# Patient Record
Sex: Male | Born: 1955 | Race: White | Hispanic: No | Smoking: Never smoker
Health system: Southern US, Community
[De-identification: ages and names within clinical notes are randomized; demographics above are authoritative.]

## PROBLEM LIST (undated history)

## (undated) DIAGNOSIS — G4730 Sleep apnea, unspecified: Secondary | ICD-10-CM

## (undated) DIAGNOSIS — E785 Hyperlipidemia, unspecified: Secondary | ICD-10-CM

## (undated) DIAGNOSIS — I10 Essential (primary) hypertension: Secondary | ICD-10-CM

## (undated) HISTORY — PX: HERNIA REPAIR: SHX51

## (undated) HISTORY — DX: Sleep apnea, unspecified: G47.30

## (undated) HISTORY — DX: Essential (primary) hypertension: I10

## (undated) HISTORY — DX: Hyperlipidemia, unspecified: E78.5

---

## 2010-11-09 ENCOUNTER — Ambulatory Visit: Payer: Self-pay | Admitting: Gastroenterology

## 2010-11-09 LAB — HM COLONOSCOPY

## 2010-11-11 LAB — PATHOLOGY REPORT

## 2012-11-22 ENCOUNTER — Observation Stay: Payer: Self-pay | Admitting: Internal Medicine

## 2012-11-22 LAB — CBC
HCT: 45.2 % (ref 40.0–52.0)
HGB: 15.3 g/dL (ref 13.0–18.0)
MCH: 28.3 pg (ref 26.0–34.0)
MCV: 84 fL (ref 80–100)
Platelet: 237 10*3/uL (ref 150–440)
RBC: 5.4 10*6/uL (ref 4.40–5.90)
WBC: 9.7 10*3/uL (ref 3.8–10.6)

## 2012-11-22 LAB — LIPID PANEL
Ldl Cholesterol, Calc: 107 mg/dL — ABNORMAL HIGH (ref 0–100)
Triglycerides: 97 mg/dL (ref 0–200)
VLDL Cholesterol, Calc: 19 mg/dL (ref 5–40)

## 2012-11-22 LAB — BASIC METABOLIC PANEL
Calcium, Total: 9.2 mg/dL (ref 8.5–10.1)
Chloride: 106 mmol/L (ref 98–107)
Co2: 24 mmol/L (ref 21–32)
Creatinine: 0.94 mg/dL (ref 0.60–1.30)
Osmolality: 282 (ref 275–301)
Potassium: 3.8 mmol/L (ref 3.5–5.1)
Sodium: 139 mmol/L (ref 136–145)

## 2012-11-22 LAB — HEPATIC FUNCTION PANEL A (ARMC)
Albumin: 3.8 g/dL (ref 3.4–5.0)
Alkaline Phosphatase: 107 U/L (ref 50–136)
Bilirubin,Total: 0.4 mg/dL (ref 0.2–1.0)
SGPT (ALT): 33 U/L (ref 12–78)
Total Protein: 8.3 g/dL — ABNORMAL HIGH (ref 6.4–8.2)

## 2012-11-22 LAB — TROPONIN I: Troponin-I: <0.02 ng/mL

## 2012-11-22 LAB — CK TOTAL AND CKMB (NOT AT ARMC): CK, Total: 97 U/L (ref 35–232)

## 2012-11-23 LAB — TROPONIN I: Troponin-I: <0.02 ng/mL

## 2013-10-12 ENCOUNTER — Ambulatory Visit: Payer: Self-pay | Admitting: General Practice

## 2014-07-23 LAB — PSA

## 2015-03-14 NOTE — H&P (Signed)
PATIENT NAME:  Casey Hunter, Casey Hunter MR#:  [id number] DATE OF BIRTH:  1956/03/05  DATE OF ADMISSION:  11/22/2012  PRIMARY CARE PHYSICIAN: Vonita Moss, MD  CHIEF COMPLAINT: Chest pain on and off for a few days.   HISTORY OF PRESENT ILLNESS: Casey Hunter is a pleasant 59 year old obese Caucasian gentleman with history of hypertension, diabetes and high cholesterol who comes to the Emergency Room after he started experiencing chest pain on and off for the past few days. It got worse today described as a mid substernal chest pain radiating to the left arm and facial numbness. He rate his pain around 5 to 6 on a scale of 10. Currently after treatment with aspirin and nitroglycerin, his pain is 1 to 2. He denies any shortness of breath. Next, in the Emergency Room, the patient is hemodynamically stable. He is being admitted for further evaluation and management of his chest pain.   PAST MEDICAL HISTORY: 1. Type 2 diabetes, on insulin. 2. High cholesterol.  3. Hypertension.  4. Hernia repair.   FAMILY HISTORY: CAD, premature, in brother, around age 33. His brother did end up having stents.   MEDICATIONS: 1. Tribenzor 40 mg/10 mg/25 mg p.o. daily.  2. NovoLog 6 units 2 times a day. 3. Niacin 1000 mg once a day at bedtime. 4. Levemir 60 mg 2 times a day. 5. Kombiglyze XR 5 mg/1000 mg 2 tablets at bedtime.  6. Crestor 10 mg 2 times a week.  7. Aspirin 81 mg daily.  8. Welchol 625 mg 3 tablets daily at bedtime.  9. Zetia 10 mg daily.   SOCIAL HISTORY: Denies history of smoking or alcohol use.   REVIEW OF SYSTEMS:  CONSTITUTIONAL: No fever, fatigue or weakness.   EYES: No blurred or double vision. No glaucoma.   ENT: No tinnitus, ear pain or hearing loss.   RESPIRATORY: No cough, wheeze, hemoptysis or dyspnea.   CARDIOVASCULAR: Positive for chest pain.  No orthopnea or edema. Positive for hypertension.   GASTROINTESTINAL: No nausea, vomiting, diarrhea or abdominal pain. No  GERD.  GENITOURINARY: No dysuria or hematuria.   ENDOCRINE: No polyuria or nocturia.   HEMATOLOGY: No anemia or easy bruising.   SKIN: Positive for psoriasis rash around on the hands and on the elbows.   MUSCULOSKELETAL: Positive for arthritis.   NEUROLOGIC: No CVA or TIA.   PSYCH: No anxiety or depression. All other systems reviewed and negative.   PHYSICAL EXAMINATION:  GENERAL: The patient is awake, alert and oriented x 3, not in acute distress.   VITAL SIGNS: Afebrile, pulse 101, blood pressure 101/74 and saturations are 96% on room air.   GENERAL: Morbidly obese, not in acute distress.   HEENT: Atraumatic, normocephalic. Pupils are equally round and reactive to light and accommodation. EOMs intact. Oral mucosa is moist.   NECK: Supple. No JVD. No carotid bruit.   RESPIRATORY: Clear to auscultation bilaterally. No rales, rhonchi, respiratory distress or labored breathing.   CARDIOVASCULAR:  Both the heart sounds are normal. Rate and rhythm is regular. PMI is not lateralized. Chest is nontender.   EXTREMITIES: Good pedal pulses. Good femoral pulses. No lower extremity edema.   ABDOMEN: Soft, obese and nontender. No organomegaly. Positive bowel sounds.   NEUROLOGIC: Grossly intact cranial nerves II through XII. No motor or sensory deficits.   PSYCH: The patient is awake, alert and oriented x 3.   SKIN: Warm and dry. Psoriatic lesions present over the joints in the upper extremities.  LABS/RADIOLOGIC STUDIES: Chest x-ray:  No acute cardiopulmonary disease.   CBC within normal limits. Basic metabolic panel within normal limits, except glucose of 151. Cardiac enzymes, first set negative. LFTs within normal limits.   EKG shows sinus tachycardia with Q waves in inferior leads, appears to be old, along with left axis deviation.   ASSESSMENT AND PLAN: This is 59 year old Casey Hunter with history of hypertension, type 2 diabetes and hyperlipidemia who presents to the Emergency  Room with:  1. Chest pain. The patient is being admitted for overnight observation on telemetry floor. He has multiple risk factors including uncontrolled type 2 diabetes, hypertension, hyperlipidemia and family history of premature CAD.  We will admit the patient to the telemetry floor and continue his home medications with aspirin, niacin and statins along with Welchol. We will also continue his home medication, which is his Tribenzor, that is a combination of hydrochlorothiazide, amlodipine and olmesartan. Nitroglycerin patch prescribed. Continue daily aspirin. I will also start the patient on Lovenox 1 mg/kg 2 times a day until the patient is ruled out. Cardiology consultation today. [facility]. We will also order echocardiogram.  2. Type 2 diabetes, uncontrolled, insulin requiring. We will resume the patient's Levemir and Regular NovoLog insulin twice a day along with his Kombiglyze and sliding scale insulin.  3. Hyperlipidemia. The patient will be continued on niacin, Welchol, Crestor and Zetia.  4. DVT prophylaxis. On Lovenox and aspirin.  5. Morbid obesity. The patient is recommended diet and exercise.   Further work-up according to the patient's clinical course. Hospital admission plan was discussed with the patient and family members.   TIME SPENT: 50 minutes. ____________________________ Wylie HailSona A. Allena KatzPatel, MD sap:sb D: 11/22/2012 11:39:36 ET T: 11/22/2012 11:52:09 ET JOB#: [id number]  cc: Jlon Betker A. Allena KatzPatel, MD, <Dictator> Steele SizerMark A. Crissman, MD Willow OraSONA A Bonnita Newby MD ELECTRONICALLY SIGNED 11/26/2012 11:36

## 2015-03-14 NOTE — Consult Note (Signed)
PATIENT NAME:  Casey Hunter, Casey Hunter DATE OF BIRTH:  04-28-1956  DATE OF CONSULTATION:  11/22/2012  REFERRING PHYSICIAN:  Enedina FinnerSona Patel, MD CONSULTING PHYSICIAN:  Casey MillardAlexander Alisha Bacus, MD  PRIMARY CARE PHYSICIAN: Vonita MossMark Crissman, MD  CHIEF COMPLAINT: Chest pain.   REASON FOR CONSULTATION: Consultation is requested for evaluation of chest pain.   HISTORY OF PRESENT ILLNESS: The patient is a 59 year old gentleman with multiple cardiovascular risk factors referred for evaluation of chest pain. The patient reports that he experienced his first episode of chest discomfort sometime following the Christmas holiday. During the last two days, the patient has had recurrent episodes of chest discomfort. He describes it as substernal chest discomfort, at times pressure-like and at other times sharp pain. He has also noted tingling in his face and his left arm. These episodes appear to be unrelated to exertion. The patient normally is quite active. [facility] Room where EKG was nondiagnostic. Initial troponin is negative.   PAST MEDICAL HISTORY: 1. Hypertension.  2. Hyperlipidemia.  3. Type 2 diabetes.   MEDICATIONS:  1. Aspirin 81 mg daily.  2. Tribenzor 40/10/25 mg daily.  3. Niacin 1000 mg at bedtime.  4. Crestor 10 mg twice a week. 5. Welchol 625 mg 3 tablets at bedtime.  6. Zetia 10 mg daily.  7. NovoLog 6 units 2 times a day. 8. Levemir 60 mg 2 times a day. 9. Kombiglyze XR 5 mg/1000 mg 2 tablets at bedtime.   SOCIAL HISTORY: The patient lives with his girlfriend. He denies tobacco abuse.   FAMILY HISTORY: Brother is status post coronary stents.   REVIEW OF SYSTEMS:  CONSTITUTIONAL: No fever or chills.   EYES: No blurry vision.   EARS: No hearing loss.   RESPIRATORY: No shortness of breath.   CARDIOVASCULAR: Chest discomfort, as described above.   GASTROINTESTINAL: No nausea, vomiting or diarrhea.   GENITOURINARY: No dysuria or hematuria.   ENDOCRINE:  No polyuria or polydipsia.   MUSCULOSKELETAL: No arthralgias or myalgias.   NEUROLOGICAL: No focal muscle weakness or numbness.   PSYCHOLOGICAL: No depression or anxiety.  PHYSICAL EXAMINATION:  VITAL SIGNS: Blood pressure is 158/99, pulse 100, respirations 18, temperature 97.8 and pulse oximetry 95%.   HEENT: Pupils equal and reactive to light and accommodation.   NECK: Supple without thyromegaly.   PULMONARY: Lungs were clear.   CARDIOVASCULAR: Normal JVP. Normal PMI. Regular rate and rhythm. Normal S1, S2. No appreciable gallop, murmur or rub.   ABDOMEN: Soft and nontender. Pulses were intact bilaterally.   MUSCULOSKELETAL: Normal muscle tone.   NEUROLOGIC: The patient is alert and oriented x 3. Motor and sensory are both grossly intact.   IMPRESSION: This is a 59 year old gentleman with multiple cardiovascular risk factors with recent onset chest pain with typical and atypical features. EKG is nondiagnostic. The patient has ruled out for myocardial infarction by CPK isoenzymes and troponin. Blood pressure is elevated.   RECOMMENDATIONS: 1. Up titrate metoprolol as needed for blood pressure control.  2. Continue to cycle cardiac enzymes.  3. If the patient does well overnight and continues to rule out for myocardial infarction and then proceed with ETT sestamibi study in the a.m.  4. If the patient has persistent or worsening chest pain and/or positive troponin, then would proceed with cardiac catheterization.  ____________________________ Casey MillardAlexander Amilee Janvier, MD ap:sb D: 11/22/2012 13:19:36 ET T: 11/22/2012 13:27:51 ET JOB#: [id number]  cc: Casey MillardAlexander Nimco Bivens, MD, <Dictator> Casey MillardALEXANDER Sondos Wolfman MD ELECTRONICALLY SIGNED 12/08/2012 8:47

## 2015-03-14 NOTE — Discharge Summary (Signed)
PATIENT NAME:  Casey Hunter, Casey Hunter MR#:  [id number] DATE OF BIRTH:  February 20, 1956  DATE OF ADMISSION:  11/22/2012 DATE OF DISCHARGE:  11/23/2012  PRIMARY CARE PHYSICIAN: Dr. Vonita MossMark Crissman.  CONSULTATION: Cardiology, Dr. Darrold JunkerParaschos.  PRESENTING COMPLAINT: Chest pain.   DISCHARGE DIAGNOSES:  1. Chest pain, appears atypical.  2. Hypertension.  3. Type 2 diabetes.  4. Hyperlipidemia.  5. Morbid obesity.   DIAGNOSTIC DATA: Myoview stress test negative for ischemia, normal wall motion, normal LV function. Cardiac enzymes x 3 negative. CBC within normal limits. Basic metabolic panel within normal limits. LFT's within normal limits. Lipid profile: LDL is 107.   DISCHARGE MEDICATIONS: Are: 1. WelChol 625 mg 3 tablets once a day at bedtime.  2. Zetia 10 mg daily.  3. Kombiglyze XR 03/999 two tablets at bedtime.  4. Niacin 1000 mg extended-release 1 tablet at bedtime.  5. Tribenzor 40/10/25 mg 1 tablet daily.  6. Crestor 10 mg twice a week.  7. Levemir 60 units b.i.d.  8. NovoLog 6 units b.i.d.  9. Aspirin 81 mg daily.   DIET: Low sodium, low fat, cholesterol, carbohydrate-controlled diet.   FOLLOWUP: Follow up with Dr. Dossie Arbourrissman on 12/04/2012 at 9:45 a.m.   BRIEF SUMMARY OF HOSPITAL COURSE: The patient is a pleasant 59 year old Caucasian gentleman with history of hypertension, type 2 diabetes, comes to the Emergency Room with:  1. Chest pain. He was admitted on telemetry floor given his multiple risk factors of diabetes, hypertension, hyperlipidemia, and morbid obesity. The patient also has a family history of premature coronary artery disease. He was ruled out with acute MI with 3 sets of negative cardiac enzymes and no EKG changes. The patient was seen by Dr. Darrold JunkerParaschos and he underwent Myoview scan which was essentially negative for any ischemia. The patient did not have any further chest pain, will follow up with Dr. Darrold JunkerParaschos as needed.  2. Type 2 diabetes. He was resumed on his Levemir regular,  NovoLog insulin, along with his Kombiglyze. 3. Hyperlipidemia: The patient is on niacin, WelChol, Crestor, and Zetia. 4. DVT prophylaxis: Lovenox and aspirin were given.  5. Morbid obesity: The patient was recommended diet and exercise.  Hospital stay otherwise remained stable. The patient remained a full code.   TIME SPENT: 40 minutes.    ____________________________ Wylie HailSona A. Allena KatzPatel, MD sap:es D: 11/24/2012 07:03:27 ET T: 11/24/2012 08:08:48 ET JOB#: [id number]  cc: Konrad Hoak A. Allena KatzPatel, MD, <Dictator> Steele SizerMark A. Crissman, MD Willow OraSONA A Denaja Verhoeven MD ELECTRONICALLY SIGNED 11/26/2012 11:36

## 2015-09-16 DIAGNOSIS — E1169 Type 2 diabetes mellitus with other specified complication: Secondary | ICD-10-CM | POA: Insufficient documentation

## 2015-09-16 DIAGNOSIS — E785 Hyperlipidemia, unspecified: Secondary | ICD-10-CM

## 2015-09-16 DIAGNOSIS — I152 Hypertension secondary to endocrine disorders: Secondary | ICD-10-CM

## 2015-09-16 DIAGNOSIS — E119 Type 2 diabetes mellitus without complications: Secondary | ICD-10-CM

## 2015-09-16 DIAGNOSIS — E7800 Pure hypercholesterolemia, unspecified: Secondary | ICD-10-CM

## 2015-09-16 DIAGNOSIS — G4730 Sleep apnea, unspecified: Secondary | ICD-10-CM | POA: Insufficient documentation

## 2015-09-16 DIAGNOSIS — I10 Essential (primary) hypertension: Secondary | ICD-10-CM | POA: Insufficient documentation

## 2015-09-16 DIAGNOSIS — E1159 Type 2 diabetes mellitus with other circulatory complications: Secondary | ICD-10-CM | POA: Insufficient documentation

## 2015-09-16 HISTORY — DX: Type 2 diabetes mellitus with other circulatory complications: I15.2

## 2015-09-16 HISTORY — DX: Type 2 diabetes mellitus with other circulatory complications: E11.59

## 2015-09-16 HISTORY — DX: Type 2 diabetes mellitus with other specified complication: E11.69

## 2015-09-17 ENCOUNTER — Encounter: Payer: Self-pay | Admitting: Family Medicine

## 2015-09-17 ENCOUNTER — Ambulatory Visit (INDEPENDENT_AMBULATORY_CARE_PROVIDER_SITE_OTHER): Payer: BLUE CROSS/BLUE SHIELD | Admitting: Family Medicine

## 2015-09-17 VITALS — BP 117/77 | HR 112 | Temp 97.7°F | Ht 72.1 in | Wt 269.0 lb

## 2015-09-17 DIAGNOSIS — Z23 Encounter for immunization: Secondary | ICD-10-CM | POA: Diagnosis not present

## 2015-09-17 DIAGNOSIS — I10 Essential (primary) hypertension: Secondary | ICD-10-CM | POA: Diagnosis not present

## 2015-09-17 DIAGNOSIS — E785 Hyperlipidemia, unspecified: Secondary | ICD-10-CM | POA: Diagnosis not present

## 2015-09-17 DIAGNOSIS — E119 Type 2 diabetes mellitus without complications: Secondary | ICD-10-CM

## 2015-09-17 LAB — BAYER DCA HB A1C WAIVED: HB A1C: 6.3 % (ref <7.0)

## 2015-09-17 MED ORDER — NIACIN ER (ANTIHYPERLIPIDEMIC) 1000 MG PO TBCR: 1000.0000 mg | EXTENDED_RELEASE_TABLET | Freq: Every day | ORAL | Status: DC

## 2015-09-17 MED ORDER — METFORMIN HCL 1000 MG PO TABS: 1000.0000 mg | ORAL_TABLET | Freq: Two times a day (BID) | ORAL | Status: DC

## 2015-09-17 MED ORDER — EZETIMIBE 10 MG PO TABS: 10.0000 mg | ORAL_TABLET | Freq: Every day | ORAL | Status: DC

## 2015-09-17 MED ORDER — DAPAGLIFLOZIN PROPANEDIOL 10 MG PO TABS: 10.0000 mg | ORAL_TABLET | Freq: Every day | ORAL | Status: DC

## 2015-09-17 MED ORDER — COLESEVELAM HCL 625 MG PO TABS: 1875.0000 mg | ORAL_TABLET | Freq: Two times a day (BID) | ORAL | Status: DC

## 2015-09-17 MED ORDER — OLMESARTAN-AMLODIPINE-HCTZ 40-10-25 MG PO TABS: 1.0000 | ORAL_TABLET | Freq: Every day | ORAL | Status: DC

## 2015-09-17 MED ORDER — ALBIGLUTIDE 50 MG ~~LOC~~ PEN: 1.0000 "application " | PEN_INJECTOR | SUBCUTANEOUS | Status: DC

## 2015-09-17 MED ORDER — SITAGLIPTIN PHOSPHATE 100 MG PO TABS: 100.0000 mg | ORAL_TABLET | Freq: Every day | ORAL | Status: DC

## 2015-09-17 NOTE — Progress Notes (Signed)
BP 117/77 mmHg  Pulse 112  Temp(Src) 97.7 F (36.5 C)  Ht 6' 0.1" (1.831 m)  Wt 269 lb (122.018 kg)  BMI 36.40 kg/m2  SpO2 95%   Subjective:    Patient ID: Casey GreenspanJohn H Devereux Jr., male    DOB: 05/24/1956, 59 y.o.   MRN: [id number]  HPI: Casey GreenspanJohn H Leppo Jr. is a 59 y.o. male  Chief Complaint  Patient presents with  . Diabetes  . Hypertension  . Hyperlipidemia   patient doing well diabetes noted low blood sugar spells no complaints taking medications faithfully except for [employer] wants to change Onglyza and metformin combo Blood pressure doing well no complaints from medications Cholesterol doing well no complaints medications takes all medicines faithfully without problems.  Relevant past medical, surgical, family and social history reviewed and updated as indicated. Interim medical history since our last visit reviewed. Allergies and medications reviewed and updated.  Review of Systems  Constitutional: Negative.   Respiratory: Negative.   Cardiovascular: Negative.     Per HPI unless specifically indicated above     Objective:    BP 117/77 mmHg  Pulse 112  Temp(Src) 97.7 F (36.5 C)  Ht 6' 0.1" (1.831 m)  Wt 269 lb (122.018 kg)  BMI 36.40 kg/m2  SpO2 95%  Wt Readings from Last 3 Encounters:  09/17/15 269 lb (122.018 kg)  02/27/15 283 lb (128.368 kg)    Physical Exam  Constitutional: He is oriented to person, place, and time. He appears well-developed and well-nourished. No distress.  HENT:  Head: Normocephalic and atraumatic.  Right Ear: Hearing normal.  Left Ear: Hearing normal.  Nose: Nose normal.  Eyes: Conjunctivae and lids are normal. Right eye exhibits no discharge. Left eye exhibits no discharge. No scleral icterus.  Cardiovascular: Normal rate, regular rhythm and normal heart sounds.   Pulmonary/Chest: Effort normal and breath sounds normal. No respiratory distress.  Musculoskeletal: Normal range of motion.  Neurological: He is alert and oriented to  person, place, and time.  Skin: Skin is intact. No rash noted.  Psychiatric: He has a normal mood and affect. His speech is normal and behavior is normal. Judgment and thought content normal. Cognition and memory are normal.    Results for orders placed or performed in visit on 09/16/15  PSA  Result Value Ref Range   PSA from PP   HM COLONOSCOPY  Result Value Ref Range   HM Colonoscopy from PP       Assessment & Plan:   Problem List Items Addressed This Visit      Cardiovascular and Mediastinum   Hypertension    The current medical regimen is effective;  continue present plan and medications.       Relevant Medications   colesevelam (WELCHOL) 625 MG tablet   ezetimibe (ZETIA) 10 MG tablet   niacin (NIASPAN) 1000 MG CR tablet   Olmesartan-Amlodipine-HCTZ (TRIBENZOR) 40-10-25 MG TABS     Endocrine   Diabetes (HCC)    The current medical regimen is effective;  continue present plan and medications.       Relevant Medications   metFORMIN (GLUCOPHAGE) 1000 MG tablet   sitaGLIPtin (JANUVIA) 100 MG tablet   Albiglutide (TANZEUM) 50 MG PEN   dapagliflozin propanediol (FARXIGA) 10 MG TABS tablet   Olmesartan-Amlodipine-HCTZ (TRIBENZOR) 40-10-25 MG TABS   Other Relevant Orders   Bayer DCA Hb A1c Waived     Other   Hyperlipidemia    The current medical regimen is effective;  continue  present plan and medications.       Relevant Medications   colesevelam (WELCHOL) 625 MG tablet   ezetimibe (ZETIA) 10 MG tablet   niacin (NIASPAN) 1000 MG CR tablet   Olmesartan-Amlodipine-HCTZ (TRIBENZOR) 40-10-25 MG TABS    Other Visit Diagnoses    Immunization due    -  Primary    Relevant Orders    Flu Vaccine QUAD 36+ mos PF IM (Fluarix & Fluzone Quad PF) (Completed)        Follow up plan: Return in about 3 months (around 12/18/2015), or if symptoms worsen or fail to improve, for Recheck medical problems A1c, BMP, lipids, ALT AST.

## 2015-09-17 NOTE — Assessment & Plan Note (Signed)
The current medical regimen is effective;  continue present plan and medications.  

## 2015-10-20 ENCOUNTER — Telehealth: Payer: Self-pay

## 2015-10-20 MED ORDER — SILDENAFIL CITRATE 100 MG PO TABS: 50.0000 mg | ORAL_TABLET | Freq: Every day | ORAL | Status: DC | PRN

## 2015-10-20 NOTE — Telephone Encounter (Signed)
Patient requesting Rx for Viagra  [facility] [address]

## 2015-10-21 ENCOUNTER — Telehealth: Payer: Self-pay

## 2015-10-21 NOTE — Telephone Encounter (Signed)
Patient notified

## 2015-10-21 NOTE — Telephone Encounter (Signed)
Left message to return phone call.

## 2016-01-05 ENCOUNTER — Ambulatory Visit: Payer: BLUE CROSS/BLUE SHIELD | Admitting: Family Medicine

## 2016-01-07 ENCOUNTER — Ambulatory Visit: Payer: BLUE CROSS/BLUE SHIELD | Admitting: Family Medicine

## 2016-01-08 ENCOUNTER — Encounter: Payer: Self-pay | Admitting: Family Medicine

## 2016-01-08 ENCOUNTER — Ambulatory Visit (INDEPENDENT_AMBULATORY_CARE_PROVIDER_SITE_OTHER): Payer: BLUE CROSS/BLUE SHIELD | Admitting: Family Medicine

## 2016-01-08 VITALS — BP 108/72 | HR 79 | Temp 97.6°F | Ht 72.1 in | Wt 270.0 lb

## 2016-01-08 DIAGNOSIS — E119 Type 2 diabetes mellitus without complications: Secondary | ICD-10-CM | POA: Diagnosis not present

## 2016-01-08 DIAGNOSIS — Z23 Encounter for immunization: Secondary | ICD-10-CM

## 2016-01-08 DIAGNOSIS — I10 Essential (primary) hypertension: Secondary | ICD-10-CM | POA: Diagnosis not present

## 2016-01-08 DIAGNOSIS — E785 Hyperlipidemia, unspecified: Secondary | ICD-10-CM

## 2016-01-08 LAB — BAYER DCA HB A1C WAIVED: HB A1C (BAYER DCA - WAIVED): 7 % — ABNORMAL HIGH (ref <7.0)

## 2016-01-08 LAB — LP+ALT+AST PICCOLO, WAIVED
ALT (SGPT) PICCOLO, WAIVED: 21 U/L (ref 10–47)
AST (SGOT) PICCOLO, WAIVED: 26 U/L (ref 11–38)
CHOL/HDL RATIO PICCOLO,WAIVE: 3.9 mg/dL
Cholesterol Piccolo, Waived: 198 mg/dL (ref <200)
HDL CHOL PICCOLO, WAIVED: 51 mg/dL — AB (ref >59)
LDL Chol Calc Piccolo Waived: 103 mg/dL — ABNORMAL HIGH (ref <100)
TRIGLYCERIDES PICCOLO,WAIVED: 223 mg/dL — AB (ref <150)
VLDL Chol Calc Piccolo,Waive: 45 mg/dL — ABNORMAL HIGH (ref <30)

## 2016-01-08 MED ORDER — SILDENAFIL CITRATE 20 MG PO TABS: 20.0000 mg | ORAL_TABLET | ORAL | Status: DC | PRN

## 2016-01-08 NOTE — Progress Notes (Signed)
BP 108/72 mmHg  Pulse 79  Temp(Src) 97.6 F (36.4 C)  Ht 6' 0.1" (1.831 m)  Wt 270 lb (122.471 kg)  BMI 36.53 kg/m2  SpO2 99%   Subjective:    Patient ID: Casey Greenspan., male    DOB: 1956-02-05, 60 y.o.   MRN: [id number]  HPI: Casey Montellano. is a 60 y.o. male  Chief Complaint  Patient presents with  . Diabetes  . Hyperlipidemia  . Hypertension   recheck medical problems doing well with diabetes noted low blood sugar spells taking cholesterol blood pressure with good control no side effects and takes faithfully.  Relevant past medical, surgical, family and social history reviewed and updated as indicated. Interim medical history since our last visit reviewed. Allergies and medications reviewed and updated.  Review of Systems  Constitutional: Negative.   Respiratory: Negative.   Cardiovascular: Negative.     Per HPI unless specifically indicated above     Objective:    BP 108/72 mmHg  Pulse 79  Temp(Src) 97.6 F (36.4 C)  Ht 6' 0.1" (1.831 m)  Wt 270 lb (122.471 kg)  BMI 36.53 kg/m2  SpO2 99%  Wt Readings from Last 3 Encounters:  01/08/16 270 lb (122.471 kg)  09/17/15 269 lb (122.018 kg)  02/27/15 283 lb (128.368 kg)    Physical Exam  Constitutional: He is oriented to person, place, and time. He appears well-developed and well-nourished. No distress.  HENT:  Head: Normocephalic and atraumatic.  Right Ear: Hearing normal.  Left Ear: Hearing normal.  Nose: Nose normal.  Eyes: Conjunctivae and lids are normal. Right eye exhibits no discharge. Left eye exhibits no discharge. No scleral icterus.  Cardiovascular: Normal rate, regular rhythm and normal heart sounds.   Pulmonary/Chest: Effort normal and breath sounds normal. No respiratory distress.  Musculoskeletal: Normal range of motion.  Neurological: He is alert and oriented to person, place, and time.  Skin: Skin is intact. No rash noted.  Psychiatric: He has a normal mood and affect. His speech is  normal and behavior is normal. Judgment and thought content normal. Cognition and memory are normal.    Results for orders placed or performed in visit on 09/17/15  Bayer DCA Hb A1c Waived  Result Value Ref Range   Bayer DCA Hb A1c Waived 6.3 <7.0 %      Assessment & Plan:   Problem List Items Addressed This Visit      Cardiovascular and Mediastinum   Hypertension    The current medical regimen is effective;  continue present plan and medications.       Relevant Medications   sildenafil (REVATIO) 20 MG tablet   Other Relevant Orders   Basic metabolic panel     Endocrine   Diabetes (HCC) - Primary    The current medical regimen is effective;  continue present plan and medications.       Relevant Orders   Bayer DCA Hb A1c Waived     Other   Hyperlipidemia    The current medical regimen is effective;  continue present plan and medications.       Relevant Medications   sildenafil (REVATIO) 20 MG tablet   Other Relevant Orders   LP+ALT+AST Piccolo, Waived    Other Visit Diagnoses    Immunization due        Relevant Orders    Tdap vaccine greater than or equal to 7yo IM (Completed)        Follow up plan: Return  in about 3 months (around 04/06/2016) for Physical Exam a1c.

## 2016-01-08 NOTE — Assessment & Plan Note (Signed)
The current medical regimen is effective;  continue present plan and medications.  

## 2016-01-09 LAB — BASIC METABOLIC PANEL
BUN / CREAT RATIO: 20 (ref 10–22)
BUN: 17 mg/dL (ref 8–27)
CALCIUM: 9.5 mg/dL (ref 8.6–10.2)
CHLORIDE: 97 mmol/L (ref 96–106)
CO2: 24 mmol/L (ref 18–29)
CREATININE: 0.87 mg/dL (ref 0.76–1.27)
GFR calc Af Amer: 108 mL/min/{1.73_m2} (ref >59)
GFR calc non Af Amer: 94 mL/min/{1.73_m2} (ref >59)
GLUCOSE: 188 mg/dL — AB (ref 65–99)
Potassium: 4.1 mmol/L (ref 3.5–5.2)
Sodium: 138 mmol/L (ref 134–144)

## 2016-01-10 ENCOUNTER — Encounter: Payer: Self-pay | Admitting: Family Medicine

## 2016-02-29 DIAGNOSIS — S61205A Unspecified open wound of left ring finger without damage to nail, initial encounter: Secondary | ICD-10-CM | POA: Diagnosis not present

## 2016-04-14 ENCOUNTER — Encounter: Payer: Self-pay | Admitting: Family Medicine

## 2016-04-14 ENCOUNTER — Ambulatory Visit (INDEPENDENT_AMBULATORY_CARE_PROVIDER_SITE_OTHER): Payer: BLUE CROSS/BLUE SHIELD | Admitting: Family Medicine

## 2016-04-14 VITALS — BP 104/70 | HR 86 | Temp 97.7°F | Ht 72.0 in | Wt 272.0 lb

## 2016-04-14 DIAGNOSIS — G4730 Sleep apnea, unspecified: Secondary | ICD-10-CM | POA: Diagnosis not present

## 2016-04-14 DIAGNOSIS — E785 Hyperlipidemia, unspecified: Secondary | ICD-10-CM | POA: Diagnosis not present

## 2016-04-14 DIAGNOSIS — E119 Type 2 diabetes mellitus without complications: Secondary | ICD-10-CM

## 2016-04-14 DIAGNOSIS — M1710 Unilateral primary osteoarthritis, unspecified knee: Secondary | ICD-10-CM | POA: Insufficient documentation

## 2016-04-14 DIAGNOSIS — Z0000 Encounter for general adult medical examination without abnormal findings: Secondary | ICD-10-CM | POA: Diagnosis not present

## 2016-04-14 DIAGNOSIS — M170 Bilateral primary osteoarthritis of knee: Secondary | ICD-10-CM

## 2016-04-14 DIAGNOSIS — I10 Essential (primary) hypertension: Secondary | ICD-10-CM

## 2016-04-14 DIAGNOSIS — L409 Psoriasis, unspecified: Secondary | ICD-10-CM | POA: Diagnosis not present

## 2016-04-14 DIAGNOSIS — M179 Osteoarthritis of knee, unspecified: Secondary | ICD-10-CM | POA: Insufficient documentation

## 2016-04-14 HISTORY — DX: Unilateral primary osteoarthritis, unspecified knee: M17.10

## 2016-04-14 HISTORY — DX: Osteoarthritis of knee, unspecified: M17.9

## 2016-04-14 LAB — URINALYSIS, ROUTINE W REFLEX MICROSCOPIC
BILIRUBIN UA: NEGATIVE
KETONES UA: NEGATIVE
LEUKOCYTES UA: NEGATIVE
Nitrite, UA: NEGATIVE
PROTEIN UA: NEGATIVE
RBC UA: NEGATIVE
SPEC GRAV UA: 1.01 (ref 1.005–1.030)
Urobilinogen, Ur: 0.2 mg/dL (ref 0.2–1.0)
pH, UA: 6.5 (ref 5.0–7.5)

## 2016-04-14 LAB — BAYER DCA HB A1C WAIVED: HB A1C (BAYER DCA - WAIVED): 7.1 % — ABNORMAL HIGH (ref <7.0)

## 2016-04-14 MED ORDER — DAPAGLIFLOZIN PROPANEDIOL 10 MG PO TABS: 10.0000 mg | ORAL_TABLET | Freq: Every day | ORAL | Status: DC

## 2016-04-14 MED ORDER — MELOXICAM 15 MG PO TABS
15.0000 mg | ORAL_TABLET | Freq: Every day | ORAL | Status: DC
Start: 2016-04-14 — End: 2016-08-22

## 2016-04-14 MED ORDER — METFORMIN HCL 1000 MG PO TABS
1000.0000 mg | ORAL_TABLET | Freq: Two times a day (BID) | ORAL | Status: DC
Start: 2016-04-14 — End: 2017-06-01

## 2016-04-14 MED ORDER — OLMESARTAN-AMLODIPINE-HCTZ 40-10-25 MG PO TABS: 1.0000 | ORAL_TABLET | Freq: Every day | ORAL | Status: DC

## 2016-04-14 MED ORDER — COLESEVELAM HCL 625 MG PO TABS: 1875.0000 mg | ORAL_TABLET | Freq: Two times a day (BID) | ORAL | Status: DC

## 2016-04-14 MED ORDER — NIACIN ER (ANTIHYPERLIPIDEMIC) 1000 MG PO TBCR: 1000.0000 mg | EXTENDED_RELEASE_TABLET | Freq: Every day | ORAL | Status: DC

## 2016-04-14 MED ORDER — ALBIGLUTIDE 50 MG ~~LOC~~ PEN: 1.0000 "application " | PEN_INJECTOR | SUBCUTANEOUS | Status: DC

## 2016-04-14 MED ORDER — EZETIMIBE 10 MG PO TABS: 10.0000 mg | ORAL_TABLET | Freq: Every day | ORAL | Status: DC

## 2016-04-14 MED ORDER — SITAGLIPTIN PHOSPHATE 100 MG PO TABS: 100.0000 mg | ORAL_TABLET | Freq: Every day | ORAL | Status: DC

## 2016-04-14 NOTE — Assessment & Plan Note (Signed)
The current medical regimen is effective;  continue present plan and medications.  

## 2016-04-14 NOTE — Assessment & Plan Note (Signed)
Stable will use meloxicam PRN

## 2016-04-14 NOTE — Addendum Note (Signed)
Addended byVonita Moss: Floella Ensz on: 04/14/2016 11:43 AM   Modules accepted: Orders, SmartSet

## 2016-04-14 NOTE — Progress Notes (Signed)
BP 104/70 mmHg  Pulse 86  Temp(Src) 97.7 F (36.5 C)  Ht 6' (1.829 m)  Wt 272 lb (123.378 kg)  BMI 36.88 kg/m2  SpO2 98%   Subjective:    Patient ID: Casey Hunter., male    DOB: Sep 23, 1956, 60 y.o.   MRN: [id number]  HPI: Kirk Basquez. is a 60 y.o. male  Chief Complaint  Patient presents with  . Annual Exam  Patient all in all doing well no complaints from medications taken faithfully without problems no low blood sugar spells blood pressure, cholesterol, diabetes all doing well. Sleep apnea not using CPAP Weights remained more or less stable.   Relevant past medical, surgical, family and social history reviewed and updated as indicated. Interim medical history since our last visit reviewed. Allergies and medications reviewed and updated.  Review of Systems  Constitutional: Negative.   HENT: Negative.   Eyes: Negative.   Respiratory: Negative.   Cardiovascular: Negative.   Gastrointestinal: Negative.   Endocrine: Negative.   Genitourinary: Negative.   Musculoskeletal: Negative.        Patient with occasional knees bothering him when they walk no clicking locking no giving way just especially first thing in the morning and at times during the day.  Skin: Negative.   Allergic/Immunologic: Negative.   Neurological: Negative.   Hematological: Negative.   Psychiatric/Behavioral: Negative.     Per HPI unless specifically indicated above     Objective:    BP 104/70 mmHg  Pulse 86  Temp(Src) 97.7 F (36.5 C)  Ht 6' (1.829 m)  Wt 272 lb (123.378 kg)  BMI 36.88 kg/m2  SpO2 98%  Wt Readings from Last 3 Encounters:  04/14/16 272 lb (123.378 kg)  01/08/16 270 lb (122.471 kg)  09/17/15 269 lb (122.018 kg)    Physical Exam  Constitutional: He is oriented to person, place, and time. He appears well-developed and well-nourished.  HENT:  Head: Normocephalic and atraumatic.  Right Ear: External ear normal.  Left Ear: External ear normal.  Eyes:  Conjunctivae and EOM are normal. Pupils are equal, round, and reactive to light.  Neck: Normal range of motion. Neck supple.  Cardiovascular: Normal rate, regular rhythm, normal heart sounds and intact distal pulses.   Pulmonary/Chest: Effort normal and breath sounds normal.  Abdominal: Soft. Bowel sounds are normal. There is no splenomegaly or hepatomegaly.  Genitourinary: Rectum normal, prostate normal and penis normal.  Musculoskeletal: Normal range of motion.  Neurological: He is alert and oriented to person, place, and time. He has normal reflexes.  Skin: No rash noted. No erythema.  Psychiatric: He has a normal mood and affect. His behavior is normal. Judgment and thought content normal.    Results for orders placed or performed in visit on 01/08/16  LP+ALT+AST Piccolo, [employer]  Result Value Ref Range   ALT (SGPT) Piccolo, Waived 21 10 - 47 U/L   AST (SGOT) Piccolo, Waived 26 11 - 38 U/L   Cholesterol Piccolo, Waived 198 <200 mg/dL   HDL Chol Piccolo, Waived 51 (L) >59 mg/dL   Triglycerides Piccolo,Waived 223 (H) <150 mg/dL   Chol/HDL Ratio Piccolo,Waive 3.9 mg/dL   LDL Chol Calc Piccolo Waived 103 (H) <100 mg/dL   VLDL Chol Calc Piccolo,Waive 45 (H) <30 mg/dL  Bayer DCA Hb W0J Waived  Result Value Ref Range   Bayer DCA Hb A1c Waived 7.0 (H) <7.0 %  Basic metabolic panel  Result Value Ref Range   Glucose 188 (H) 65 -  99 mg/dL   BUN 17 8 - 27 mg/dL   Creatinine, Ser 1.610.87 0.76 - 1.27 mg/dL   GFR calc non Af Amer 94 >59 mL/min/1.73   GFR calc Af Amer 108 >59 mL/min/1.73   BUN/Creatinine Ratio 20 10 - 22   Sodium 138 134 - 144 mmol/L   Potassium 4.1 3.5 - 5.2 mmol/L   Chloride 97 96 - 106 mmol/L   CO2 24 18 - 29 mmol/L   Calcium 9.5 8.6 - 10.2 mg/dL      Assessment & Plan:   Problem List Items Addressed This Visit      Cardiovascular and Mediastinum   Hypertension    The current medical regimen is effective;  continue present plan and medications.       Relevant  Medications   Olmesartan-Amlodipine-HCTZ (TRIBENZOR) 40-10-25 MG TABS   niacin (NIASPAN) 1000 MG CR tablet   ezetimibe (ZETIA) 10 MG tablet   colesevelam (WELCHOL) 625 MG tablet     Endocrine   Diabetes (HCC)    The current medical regimen is effective;  continue present plan and medications.       Relevant Medications   sitaGLIPtin (JANUVIA) 100 MG tablet   Olmesartan-Amlodipine-HCTZ (TRIBENZOR) 40-10-25 MG TABS   metFORMIN (GLUCOPHAGE) 1000 MG tablet   dapagliflozin propanediol (FARXIGA) 10 MG TABS tablet   Albiglutide (TANZEUM) 50 MG PEN     Musculoskeletal and Integument   Knee osteoarthritis    Stable will use meloxicam PRN      Relevant Medications   meloxicam (MOBIC) 15 MG tablet   Psoriasis    Stable on no treatment        Other   Sleep apnea    Encourage use of CPAP      Hyperlipidemia    The current medical regimen is effective;  continue present plan and medications.       Relevant Medications   Olmesartan-Amlodipine-HCTZ (TRIBENZOR) 40-10-25 MG TABS   niacin (NIASPAN) 1000 MG CR tablet   ezetimibe (ZETIA) 10 MG tablet   colesevelam (WELCHOL) 625 MG tablet    Other Visit Diagnoses    Routine general medical examination at a health care facility    -  Primary    Relevant Orders    CBC with Differential/Platelet    Comprehensive metabolic panel    Lipid Panel w/o Chol/HDL Ratio    PSA    TSH    Urinalysis, Routine w reflex microscopic ([facility])    Diabetes mellitus without complication (HCC)        Relevant Medications    sitaGLIPtin (JANUVIA) 100 MG tablet    Olmesartan-Amlodipine-HCTZ (TRIBENZOR) 40-10-25 MG TABS    metFORMIN (GLUCOPHAGE) 1000 MG tablet    dapagliflozin propanediol (FARXIGA) 10 MG TABS tablet    Albiglutide (TANZEUM) 50 MG PEN    Other Relevant Orders    Bayer DCA Hb A1c Waived    Healthcare maintenance        Relevant Orders    Hepatitis C Antibody        Follow up plan: Return in about 3 months (around  07/15/2016) for a1c.

## 2016-04-14 NOTE — Assessment & Plan Note (Signed)
Encourage use of CPAP

## 2016-04-14 NOTE — Assessment & Plan Note (Signed)
Stable on no treatment

## 2016-04-15 ENCOUNTER — Encounter: Payer: Self-pay | Admitting: Family Medicine

## 2016-04-15 LAB — COMPREHENSIVE METABOLIC PANEL
A/G RATIO: 1.4 (ref 1.2–2.2)
ALBUMIN: 4.5 g/dL (ref 3.6–4.8)
ALT: 20 IU/L (ref 0–44)
AST: 17 IU/L (ref 0–40)
Alkaline Phosphatase: 83 IU/L (ref 39–117)
BUN / CREAT RATIO: 22 (ref 10–24)
BUN: 16 mg/dL (ref 8–27)
Bilirubin Total: 0.5 mg/dL (ref 0.0–1.2)
CALCIUM: 9.6 mg/dL (ref 8.6–10.2)
CO2: 21 mmol/L (ref 18–29)
Chloride: 98 mmol/L (ref 96–106)
Creatinine, Ser: 0.73 mg/dL — ABNORMAL LOW (ref 0.76–1.27)
GFR, EST AFRICAN AMERICAN: 117 mL/min/{1.73_m2} (ref >59)
GFR, EST NON AFRICAN AMERICAN: 101 mL/min/{1.73_m2} (ref >59)
GLUCOSE: 133 mg/dL — AB (ref 65–99)
Globulin, Total: 3.2 g/dL (ref 1.5–4.5)
Potassium: 4.3 mmol/L (ref 3.5–5.2)
Sodium: 139 mmol/L (ref 134–144)
TOTAL PROTEIN: 7.7 g/dL (ref 6.0–8.5)

## 2016-04-15 LAB — LIPID PANEL W/O CHOL/HDL RATIO
Cholesterol, Total: 201 mg/dL — ABNORMAL HIGH (ref 100–199)
HDL: 47 mg/dL (ref >39)
LDL Calculated: 105 mg/dL — ABNORMAL HIGH (ref 0–99)
Triglycerides: 247 mg/dL — ABNORMAL HIGH (ref 0–149)
VLDL CHOLESTEROL CAL: 49 mg/dL — AB (ref 5–40)

## 2016-04-15 LAB — CBC WITH DIFFERENTIAL/PLATELET
BASOS: 1 %
Basophils Absolute: 0.1 10*3/uL (ref 0.0–0.2)
EOS (ABSOLUTE): 0.3 10*3/uL (ref 0.0–0.4)
EOS: 3 %
HEMATOCRIT: 46.4 % (ref 37.5–51.0)
Hemoglobin: 15.9 g/dL (ref 12.6–17.7)
IMMATURE GRANS (ABS): 0 10*3/uL (ref 0.0–0.1)
IMMATURE GRANULOCYTES: 0 %
LYMPHS: 19 %
Lymphocytes Absolute: 1.9 10*3/uL (ref 0.7–3.1)
MCH: 28.5 pg (ref 26.6–33.0)
MCHC: 34.3 g/dL (ref 31.5–35.7)
MCV: 83 fL (ref 79–97)
Monocytes Absolute: 0.8 10*3/uL (ref 0.1–0.9)
Monocytes: 8 %
NEUTROS PCT: 69 %
Neutrophils Absolute: 6.9 10*3/uL (ref 1.4–7.0)
Platelets: 301 10*3/uL (ref 150–379)
RBC: 5.57 x10E6/uL (ref 4.14–5.80)
RDW: 14.3 % (ref 12.3–15.4)
WBC: 9.9 10*3/uL (ref 3.4–10.8)

## 2016-04-15 LAB — PSA: PROSTATE SPECIFIC AG, SERUM: 1.4 ng/mL (ref 0.0–4.0)

## 2016-04-15 LAB — HEPATITIS C ANTIBODY

## 2016-04-15 LAB — TSH: TSH: 1.28 u[IU]/mL (ref 0.450–4.500)

## 2016-07-15 ENCOUNTER — Ambulatory Visit: Payer: BLUE CROSS/BLUE SHIELD | Admitting: Family Medicine

## 2016-08-03 ENCOUNTER — Encounter: Payer: Self-pay | Admitting: Family Medicine

## 2016-08-03 ENCOUNTER — Ambulatory Visit (INDEPENDENT_AMBULATORY_CARE_PROVIDER_SITE_OTHER): Payer: BLUE CROSS/BLUE SHIELD | Admitting: Family Medicine

## 2016-08-03 VITALS — BP 106/71 | HR 86 | Temp 98.1°F | Wt 277.0 lb

## 2016-08-03 DIAGNOSIS — E119 Type 2 diabetes mellitus without complications: Secondary | ICD-10-CM | POA: Diagnosis not present

## 2016-08-03 DIAGNOSIS — G4730 Sleep apnea, unspecified: Secondary | ICD-10-CM

## 2016-08-03 DIAGNOSIS — E785 Hyperlipidemia, unspecified: Secondary | ICD-10-CM | POA: Diagnosis not present

## 2016-08-03 DIAGNOSIS — I10 Essential (primary) hypertension: Secondary | ICD-10-CM

## 2016-08-03 LAB — BAYER DCA HB A1C WAIVED: HB A1C: 6.9 % (ref <7.0)

## 2016-08-03 LAB — MICROALBUMIN, URINE WAIVED
CREATININE, URINE WAIVED: 100 mg/dL (ref 10–300)
MICROALB, UR WAIVED: 10 mg/L (ref 0–19)
Microalb/Creat Ratio: <30 mg/g (ref <30)

## 2016-08-03 MED ORDER — NIACIN ER (ANTIHYPERLIPIDEMIC) 1000 MG PO TBCR
1000.0000 mg | EXTENDED_RELEASE_TABLET | Freq: Two times a day (BID) | ORAL | 12 refills | Status: DC
Start: 2016-08-03 — End: 2017-10-16

## 2016-08-03 NOTE — Assessment & Plan Note (Signed)
The current medical regimen is effective;  continue present plan and medications.  

## 2016-08-03 NOTE — Assessment & Plan Note (Signed)
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

## 2016-08-03 NOTE — Progress Notes (Signed)
BP 106/71 (BP Location: Left Arm, Patient Position: Sitting, Cuff Size: Normal)   Pulse 86   Temp 98.1 F (36.7 C)   Wt 277 lb (125.6 kg) Comment: with shoes  SpO2 98%   BMI 37.57 kg/m    Subjective:    Patient ID: Casey GreenspanJohn H Fitton Jr., male    DOB: 18-Nov-1956, 60 y.o.   MRN: [id number]  HPI: Casey GreenspanJohn H Ashland Jr. is a 60 y.o. male  Chief Complaint  Patient presents with  . Diabetes  doing well no complaints from diabetes noted low blood sugar spells Taking medications without problems or concerns Niacin got inadvertently changed to once a day for where he had been taking every years at twice a day will change back Blood pressure doing well no complaints from medications Other cholesterol medicines doing well  Relevant past medical, surgical, family and social history reviewed and updated as indicated. Interim medical history since our last visit reviewed. Allergies and medications reviewed and updated.  Review of Systems  Constitutional: Negative.   Respiratory: Negative.   Cardiovascular: Negative.     Per HPI unless specifically indicated above     Objective:    BP 106/71 (BP Location: Left Arm, Patient Position: Sitting, Cuff Size: Normal)   Pulse 86   Temp 98.1 F (36.7 C)   Wt 277 lb (125.6 kg) Comment: with shoes  SpO2 98%   BMI 37.57 kg/m   Wt Readings from Last 3 Encounters:  08/03/16 277 lb (125.6 kg)  04/14/16 272 lb (123.4 kg)  01/08/16 270 lb (122.5 kg)    Physical Exam  Constitutional: He is oriented to person, place, and time. He appears well-developed and well-nourished. No distress.  HENT:  Head: Normocephalic and atraumatic.  Right Ear: Hearing normal.  Left Ear: Hearing normal.  Nose: Nose normal.  Eyes: Conjunctivae and lids are normal. Right eye exhibits no discharge. Left eye exhibits no discharge. No scleral icterus.  Cardiovascular: Normal rate, regular rhythm and normal heart sounds.   Pulmonary/Chest: Effort normal and breath sounds  normal. No respiratory distress.  Musculoskeletal: Normal range of motion.  Neurological: He is alert and oriented to person, place, and time.  Skin: Skin is intact. No rash noted.  Psychiatric: He has a normal mood and affect. His speech is normal and behavior is normal. Judgment and thought content normal. Cognition and memory are normal.    Results for orders placed or performed in visit on 04/14/16  CBC with Differential/Platelet  Result Value Ref Range   WBC 9.9 3.4 - 10.8 x10E3/uL   RBC 5.57 4.14 - 5.80 x10E6/uL   Hemoglobin 15.9 12.6 - 17.7 g/dL   Hematocrit 78.246.4 95.637.5 - 51.0 %   MCV 83 79 - 97 fL   MCH 28.5 26.6 - 33.0 pg   MCHC 34.3 31.5 - 35.7 g/dL   RDW 21.314.3 08.612.3 - 57.815.4 %   Platelets 301 150 - 379 x10E3/uL   Neutrophils 69 %   Lymphs 19 %   Monocytes 8 %   Eos 3 %   Basos 1 %   Neutrophils Absolute 6.9 1.4 - 7.0 x10E3/uL   Lymphocytes Absolute 1.9 0.7 - 3.1 x10E3/uL   Monocytes Absolute 0.8 0.1 - 0.9 x10E3/uL   EOS (ABSOLUTE) 0.3 0.0 - 0.4 x10E3/uL   Basophils Absolute 0.1 0.0 - 0.2 x10E3/uL   Immature Granulocytes 0 %   Immature Grans (Abs) 0.0 0.0 - 0.1 x10E3/uL  Comprehensive metabolic panel  Result Value Ref Range  Glucose 133 (H) 65 - 99 mg/dL   BUN 16 8 - 27 mg/dL   Creatinine, Ser 4.09 (L) 0.76 - 1.27 mg/dL   GFR calc non Af Amer 101 >59 mL/min/1.73   GFR calc Af Amer 117 >59 mL/min/1.73   BUN/Creatinine Ratio 22 10 - 24   Sodium 139 134 - 144 mmol/L   Potassium 4.3 3.5 - 5.2 mmol/L   Chloride 98 96 - 106 mmol/L   CO2 21 18 - 29 mmol/L   Calcium 9.6 8.6 - 10.2 mg/dL   Total Protein 7.7 6.0 - 8.5 g/dL   Albumin 4.5 3.6 - 4.8 g/dL   Globulin, Total 3.2 1.5 - 4.5 g/dL   Albumin/Globulin Ratio 1.4 1.2 - 2.2   Bilirubin Total 0.5 0.0 - 1.2 mg/dL   Alkaline Phosphatase 83 39 - 117 IU/L   AST 17 0 - 40 IU/L   ALT 20 0 - 44 IU/L  Lipid Panel w/o Chol/HDL Ratio  Result Value Ref Range   Cholesterol, Total 201 (H) 100 - 199 mg/dL   Triglycerides 811 (H)  0 - 149 mg/dL   HDL 47 >91 mg/dL   VLDL Cholesterol Cal 49 (H) 5 - 40 mg/dL   LDL Calculated 478 (H) 0 - 99 mg/dL  PSA  Result Value Ref Range   Prostate Specific Ag, Serum 1.4 0.0 - 4.0 ng/mL  TSH  Result Value Ref Range   TSH 1.280 0.450 - 4.500 uIU/mL  Urinalysis, Routine w reflex microscopic (not at [facility])  Result Value Ref Range   Specific Gravity, UA 1.010 1.005 - 1.030   pH, UA 6.5 5.0 - 7.5   Color, UA Yellow Yellow   Appearance Ur Clear Clear   Leukocytes, UA Negative Negative   Protein, UA Negative Negative/Trace   Glucose, UA 3+ (A) Negative   Ketones, UA Negative Negative   RBC, UA Negative Negative   Bilirubin, UA Negative Negative   Urobilinogen, Ur 0.2 0.2 - 1.0 mg/dL   Nitrite, UA Negative Negative  Bayer DCA Hb A1c Waived  Result Value Ref Range   Bayer DCA Hb A1c Waived 7.1 (H) <7.0 %  Hepatitis C Antibody  Result Value Ref Range   Hep C Virus Ab <0.1 0.0 - 0.9 s/co ratio      Assessment & Plan:   Problem List Items Addressed This Visit      Cardiovascular and Mediastinum   Hypertension    The current medical regimen is effective;  continue present plan and medications.       Relevant Medications   niacin (NIASPAN) 1000 MG CR tablet     Endocrine   Diabetes (HCC) - Primary    The current medical regimen is effective;  continue present plan and medications.       Relevant Orders   Bayer DCA Hb A1c Waived   Microalbumin, Urine Waived     Other   Sleep apnea    Continue CPAP, discussed for DOT PE need use reports      Hyperlipidemia    The current medical regimen is effective;  continue present plan and medications.       Relevant Medications   niacin (NIASPAN) 1000 MG CR tablet    Other Visit Diagnoses   None.      Follow up plan: Return in about 3 months (around 11/02/2016) for Hemoglobin A1c, BMP,  Lipids, ALT, AST.

## 2016-08-22 ENCOUNTER — Other Ambulatory Visit: Payer: Self-pay | Admitting: Family Medicine

## 2016-08-22 DIAGNOSIS — M170 Bilateral primary osteoarthritis of knee: Secondary | ICD-10-CM

## 2016-09-20 ENCOUNTER — Other Ambulatory Visit: Payer: Self-pay

## 2016-09-20 DIAGNOSIS — M170 Bilateral primary osteoarthritis of knee: Secondary | ICD-10-CM

## 2016-09-20 MED ORDER — MELOXICAM 15 MG PO TABS: 15.0000 mg | ORAL_TABLET | Freq: Every day | ORAL | 0 refills | Status: DC

## 2016-09-20 NOTE — Telephone Encounter (Signed)
Routing to provider. He has a f/u appt on 11/09/16.

## 2016-10-12 ENCOUNTER — Telehealth: Payer: Self-pay | Admitting: Family Medicine

## 2016-10-12 DIAGNOSIS — E119 Type 2 diabetes mellitus without complications: Secondary | ICD-10-CM

## 2016-10-12 NOTE — Telephone Encounter (Signed)
done

## 2016-10-12 NOTE — Telephone Encounter (Signed)
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 Please add lab and call patient for scheduling. Thanks.

## 2016-10-12 NOTE — Telephone Encounter (Signed)
Routing to provider, he has a follow up on 11/09/16 with you. Wants to know if he can come by for an a1c.

## 2016-10-13 ENCOUNTER — Other Ambulatory Visit: Payer: BLUE CROSS/BLUE SHIELD

## 2016-10-13 DIAGNOSIS — E119 Type 2 diabetes mellitus without complications: Secondary | ICD-10-CM | POA: Diagnosis not present

## 2016-10-13 NOTE — Telephone Encounter (Signed)
Patient notified

## 2016-10-14 LAB — BAYER DCA HB A1C WAIVED: HB A1C (BAYER DCA - WAIVED): 7.9 % — ABNORMAL HIGH (ref <7.0)

## 2016-11-08 DIAGNOSIS — E113293 Type 2 diabetes mellitus with mild nonproliferative diabetic retinopathy without macular edema, bilateral: Secondary | ICD-10-CM | POA: Diagnosis not present

## 2016-11-09 ENCOUNTER — Encounter: Payer: Self-pay | Admitting: Family Medicine

## 2016-11-09 ENCOUNTER — Ambulatory Visit (INDEPENDENT_AMBULATORY_CARE_PROVIDER_SITE_OTHER): Payer: BLUE CROSS/BLUE SHIELD | Admitting: Family Medicine

## 2016-11-09 ENCOUNTER — Other Ambulatory Visit: Payer: Self-pay | Admitting: Family Medicine

## 2016-11-09 VITALS — BP 126/86 | HR 88 | Temp 98.3°F | Wt 276.6 lb

## 2016-11-09 DIAGNOSIS — M170 Bilateral primary osteoarthritis of knee: Secondary | ICD-10-CM

## 2016-11-09 DIAGNOSIS — E119 Type 2 diabetes mellitus without complications: Secondary | ICD-10-CM

## 2016-11-09 DIAGNOSIS — E785 Hyperlipidemia, unspecified: Secondary | ICD-10-CM | POA: Diagnosis not present

## 2016-11-09 DIAGNOSIS — I10 Essential (primary) hypertension: Secondary | ICD-10-CM | POA: Diagnosis not present

## 2016-11-09 LAB — LP+ALT+AST PICCOLO, WAIVED
ALT (SGPT) Piccolo, Waived: 27 U/L (ref 10–47)
AST (SGOT) Piccolo, Waived: 25 U/L (ref 11–38)
CHOL/HDL RATIO PICCOLO,WAIVE: 4.3 mg/dL
Cholesterol Piccolo, Waived: 220 mg/dL — ABNORMAL HIGH (ref <200)
HDL Chol Piccolo, Waived: 51 mg/dL — ABNORMAL LOW (ref >59)
LDL CHOL CALC PICCOLO WAIVED: 100 mg/dL — AB (ref <100)
Triglycerides Piccolo,Waived: 342 mg/dL — ABNORMAL HIGH (ref <150)
VLDL Chol Calc Piccolo,Waive: 68 mg/dL — ABNORMAL HIGH (ref <30)

## 2016-11-09 MED ORDER — PITAVASTATIN CALCIUM 1 MG PO TABS: 1.0000 mg | ORAL_TABLET | Freq: Every day | ORAL | 3 refills | Status: DC

## 2016-11-09 MED ORDER — MELOXICAM 15 MG PO TABS: 15.0000 mg | ORAL_TABLET | Freq: Every day | ORAL | 3 refills | Status: DC

## 2016-11-09 NOTE — Telephone Encounter (Signed)
Pt called stated the RX he was given this morning needs a Prior authorization before his insurance will pay for it. Can someone complete this for the patient? Thanks.

## 2016-11-09 NOTE — Progress Notes (Signed)
BP 126/86 (BP Location: Left Arm, Patient Position: Sitting, Cuff Size: Large)   Pulse 88   Temp 98.3 F (36.8 C)   Wt 276 lb 9.6 oz (125.5 kg)   SpO2 96%   BMI 37.51 kg/m    Subjective:    Patient ID: Casey GreenspanJohn H Seidman Jr., male    DOB: [birth year], 60 y.o.   MRN: [id number]  HPI: Casey GreenspanJohn H Escareno Jr. is a 60 y.o. male  Chief Complaint  Patient presents with  . Diabetes  . Hyperlipidemia  . Hypertension  . Hand Pain    Patient states that his hand got cold a while back, now he has numbness  Patient's primary concern today is right hand third fourth and fifth fingers cold and slightly numb.'s been ongoing for 2 weeks no known specific trauma or irritation. No real elbow symptoms some right shoulder discomfort but nothing special no real neck complaints. Seemed to start when we had Snow 2 weeks ago hands Cold and that seemed to stay cold ever since especially those 3 fingers. 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 Patient also follow-up diabetes has had dietary indiscretion this fall and A1c's gone up. Patient with intolerance to statins so far and not taking cholesterol is remaining stable in spite of dietary indiscretion. Blood pressure continues to do well.  Relevant past medical, surgical, family and social history reviewed and updated as indicated. Interim medical history since our last visit reviewed. Allergies and medications reviewed and updated.  Review of Systems  Constitutional: Negative.   Respiratory: Negative.   Cardiovascular: Negative.     Per HPI unless specifically indicated above     Objective:    BP 126/86 (BP Location: Left Arm, Patient Position: Sitting, Cuff Size: Large)   Pulse 88   Temp 98.3 F (36.8 C)   Wt 276 lb 9.6 oz (125.5 kg)   SpO2 96%   BMI 37.51 kg/m   Wt Readings from Last 3 Encounters:  11/09/16 276 lb 9.6 oz (125.5  kg)  08/03/16 277 lb (125.6 kg)  04/14/16 272 lb (123.4 kg)    Physical Exam  Constitutional: He is oriented to person, place, and time. He appears well-developed and well-nourished. No distress.  HENT:  Head: Normocephalic and atraumatic.  Right Ear: Hearing normal.  Left Ear: Hearing normal.  Nose: Nose normal.  Eyes: Conjunctivae and lids are normal. Right eye exhibits no discharge. Left eye exhibits no discharge. No scleral icterus.  Cardiovascular: Normal rate, regular rhythm and normal heart sounds.   Pulmonary/Chest: Effort normal and breath sounds normal. No respiratory distress.  Musculoskeletal: Normal range of motion.  Neurological: He is alert and oriented to person, place, and time.  Skin: Skin is intact. No rash noted.  Psychiatric: He has a normal mood and affect. His speech is normal and behavior is normal. Judgment and thought content normal. Cognition and memory are normal.  hand and fingers normal  Results for orders placed or performed in visit on 10/13/16  Bayer DCA Hb A1c Waived  Result Value Ref Range   Bayer DCA Hb A1c Waived 7.9 (H) <7.0 %      Assessment & Plan:   Problem List Items Addressed This Visit      Cardiovascular and Mediastinum   Hypertension   Relevant Medications   Pitavastatin Calcium (LIVALO) 1 MG TABS  Other Relevant Orders   Basic metabolic panel     Endocrine   Diabetes (HCC) - Primary   Relevant Medications   Pitavastatin Calcium (LIVALO) 1 MG TABS     Musculoskeletal and Integument   Knee osteoarthritis   Relevant Medications   meloxicam (MOBIC) 15 MG tablet     Other   Hyperlipidemia   Relevant Medications   Pitavastatin Calcium (LIVALO) 1 MG TABS   Other Relevant Orders   LP+ALT+AST Piccolo, Waived      4 fingers patient will wear an elbow sleeve avoid pressure on ulnar nerve and observe symptoms Follow up plan: Return in about 3 months (around 02/07/2017) for Lipids, ALT, AST, Hemoglobin A1c.

## 2016-11-09 NOTE — Telephone Encounter (Signed)
Medication approved, patient notified.

## 2016-11-10 ENCOUNTER — Encounter: Payer: Self-pay | Admitting: Family Medicine

## 2016-11-10 LAB — BASIC METABOLIC PANEL
BUN/Creatinine Ratio: 16 (ref 10–24)
BUN: 15 mg/dL (ref 8–27)
CALCIUM: 9.3 mg/dL (ref 8.6–10.2)
CHLORIDE: 98 mmol/L (ref 96–106)
CO2: 23 mmol/L (ref 18–29)
Creatinine, Ser: 0.93 mg/dL (ref 0.76–1.27)
GFR calc Af Amer: 102 mL/min/{1.73_m2} (ref >59)
GFR, EST NON AFRICAN AMERICAN: 88 mL/min/{1.73_m2} (ref >59)
GLUCOSE: 172 mg/dL — AB (ref 65–99)
POTASSIUM: 4.2 mmol/L (ref 3.5–5.2)
Sodium: 138 mmol/L (ref 134–144)

## 2017-03-01 ENCOUNTER — Ambulatory Visit: Payer: BLUE CROSS/BLUE SHIELD | Admitting: Family Medicine

## 2017-03-07 ENCOUNTER — Ambulatory Visit: Payer: Self-pay | Admitting: Family Medicine

## 2017-03-14 ENCOUNTER — Ambulatory Visit (INDEPENDENT_AMBULATORY_CARE_PROVIDER_SITE_OTHER): Payer: BLUE CROSS/BLUE SHIELD | Admitting: Family Medicine

## 2017-03-14 ENCOUNTER — Encounter: Payer: Self-pay | Admitting: Family Medicine

## 2017-03-14 VITALS — BP 128/86 | HR 87 | Ht 73.5 in | Wt 283.0 lb

## 2017-03-14 DIAGNOSIS — E1142 Type 2 diabetes mellitus with diabetic polyneuropathy: Secondary | ICD-10-CM

## 2017-03-14 DIAGNOSIS — E119 Type 2 diabetes mellitus without complications: Secondary | ICD-10-CM | POA: Diagnosis not present

## 2017-03-14 DIAGNOSIS — E118 Type 2 diabetes mellitus with unspecified complications: Secondary | ICD-10-CM | POA: Insufficient documentation

## 2017-03-14 DIAGNOSIS — I10 Essential (primary) hypertension: Secondary | ICD-10-CM

## 2017-03-14 DIAGNOSIS — E785 Hyperlipidemia, unspecified: Secondary | ICD-10-CM

## 2017-03-14 HISTORY — DX: Type 2 diabetes mellitus with diabetic polyneuropathy: E11.42

## 2017-03-14 LAB — LP+ALT+AST PICCOLO, WAIVED
ALT (SGPT) Piccolo, Waived: 33 U/L (ref 10–47)
AST (SGOT) Piccolo, Waived: 31 U/L (ref 11–38)
CHOL/HDL RATIO PICCOLO,WAIVE: 4.2 mg/dL
CHOLESTEROL PICCOLO, WAIVED: 215 mg/dL — AB (ref <200)
HDL Chol Piccolo, Waived: 51 mg/dL — ABNORMAL LOW (ref >59)
LDL CHOL CALC PICCOLO WAIVED: 88 mg/dL (ref <100)
Triglycerides Piccolo,Waived: 380 mg/dL — ABNORMAL HIGH (ref <150)
VLDL CHOL CALC PICCOLO,WAIVE: 76 mg/dL — AB (ref <30)

## 2017-03-14 LAB — BAYER DCA HB A1C WAIVED: HB A1C (BAYER DCA - WAIVED): 7.9 % — ABNORMAL HIGH (ref <7.0)

## 2017-03-14 MED ORDER — GABAPENTIN 300 MG PO CAPS: 300.0000 mg | ORAL_CAPSULE | Freq: Two times a day (BID) | ORAL | 3 refills | Status: DC

## 2017-03-14 NOTE — Assessment & Plan Note (Signed)
Discussed new onset DPN and control of diabetes. Discussed use of gabapentin patient will consider using

## 2017-03-14 NOTE — Progress Notes (Signed)
BP 128/86   Pulse 87   Ht 6' 1.5" (1.867 m)   Wt 283 lb (128.4 kg)   SpO2 96%   BMI 36.83 kg/m    Subjective:    Patient ID: Casey Greenspan., male    DOB: 1956-05-27, 61 y.o.   MRN: [id number]  HPI: Casey Gasparini. is a 61 y.o. male  Chief Complaint  Patient presents with  . Follow-up  . Diabetes  . Hypertension  Patient follow-up doing well has gained some weight as been taking diabetes without problems concerned might be going up. Patient's especially noticed numbness and tingling of both feet with some discomfort also feeling cold when they're not cold. That ongoing pretty much all year.  Relevant past medical, surgical, family and social history reviewed and updated as indicated. Interim medical history since our last visit reviewed. Allergies and medications reviewed and updated.  Review of Systems  Per HPI unless specifically indicated above     Objective:    BP 128/86   Pulse 87   Ht 6' 1.5" (1.867 m)   Wt 283 lb (128.4 kg)   SpO2 96%   BMI 36.83 kg/m   Wt Readings from Last 3 Encounters:  03/14/17 283 lb (128.4 kg)  11/09/16 276 lb 9.6 oz (125.5 kg)  08/03/16 277 lb (125.6 kg)    Physical Exam  Constitutional: He is oriented to person, place, and time. He appears well-developed and well-nourished.  HENT:  Head: Normocephalic and atraumatic.  Eyes: Conjunctivae and EOM are normal.  Neck: Normal range of motion.  Cardiovascular: Normal rate, regular rhythm and normal heart sounds.   Pulmonary/Chest: Effort normal and breath sounds normal.  Musculoskeletal: Normal range of motion.  Neurological: He is alert and oriented to person, place, and time.  Skin: No erythema.  Psychiatric: He has a normal mood and affect. His behavior is normal. Judgment and thought content normal.    Results for orders placed or performed in visit on 11/09/16  LP+ALT+AST Piccolo, [employer]  Result Value Ref Range   ALT (SGPT) Piccolo, Waived 27 10 - 47 U/L   AST (SGOT)  Piccolo, Waived 25 11 - 38 U/L   Cholesterol Piccolo, Waived 220 (H) <200 mg/dL   HDL Chol Piccolo, Waived 51 (L) >59 mg/dL   Triglycerides Piccolo,Waived 342 (H) <150 mg/dL   Chol/HDL Ratio Piccolo,Waive 4.3 mg/dL   LDL Chol Calc Piccolo Waived 100 (H) <100 mg/dL   VLDL Chol Calc Piccolo,Waive 68 (H) <30 mg/dL  Basic metabolic panel  Result Value Ref Range   Glucose 172 (H) 65 - 99 mg/dL   BUN 15 8 - 27 mg/dL   Creatinine, Ser 3.24 0.76 - 1.27 mg/dL   GFR calc non Af Amer 88 >59 mL/min/1.73   GFR calc Af Amer 102 >59 mL/min/1.73   BUN/Creatinine Ratio 16 10 - 24   Sodium 138 134 - 144 mmol/L   Potassium 4.2 3.5 - 5.2 mmol/L   Chloride 98 96 - 106 mmol/L   CO2 23 18 - 29 mmol/L   Calcium 9.3 8.6 - 10.2 mg/dL      Assessment & Plan:   Problem List Items Addressed This Visit      Cardiovascular and Mediastinum   Hypertension - Primary    The current medical regimen is effective;  continue present plan and medications.       Relevant Orders   LP+ALT+AST Piccolo, Waived   Bayer DCA Hb A1c Waived   Basic metabolic  panel     Endocrine   Diabetes (HCC)    Poor control patient ready to get serious about diabetes weight loss and control onset 3 months before adding medications      Relevant Orders   LP+ALT+AST Piccolo, Waived   Bayer DCA Hb A1c Waived   Basic metabolic panel   Diabetic peripheral neuropathy associated with type 2 diabetes mellitus (HCC)    Discussed new onset DPN and control of diabetes. Discussed use of gabapentin patient will consider using      Relevant Medications   gabapentin (NEURONTIN) 300 MG capsule     Other   Hyperlipidemia    Discussed cholesterol elevated off medications patient getting serious now wants to not take medications for now. Never started [employer]      Relevant Orders   LP+ALT+AST Piccolo, Waived   Bayer DCA Hb A1c Waived   Basic metabolic panel       Follow up plan: Return in about 3 months (around 06/13/2017) for Physical  Exam, Hemoglobin A1c.

## 2017-03-14 NOTE — Assessment & Plan Note (Signed)
Discussed cholesterol elevated off medications patient getting serious now wants to not take medications for now. Never started [employer]

## 2017-03-14 NOTE — Assessment & Plan Note (Signed)
The current medical regimen is effective;  continue present plan and medications.  

## 2017-03-14 NOTE — Assessment & Plan Note (Signed)
Poor control patient ready to get serious about diabetes weight loss and control onset 3 months before adding medications

## 2017-03-15 ENCOUNTER — Encounter: Payer: Self-pay | Admitting: Family Medicine

## 2017-03-15 LAB — BASIC METABOLIC PANEL
BUN/Creatinine Ratio: 22 (ref 10–24)
BUN: 17 mg/dL (ref 8–27)
CALCIUM: 9.3 mg/dL (ref 8.6–10.2)
CO2: 25 mmol/L (ref 18–29)
CREATININE: 0.76 mg/dL (ref 0.76–1.27)
Chloride: 97 mmol/L (ref 96–106)
GFR calc Af Amer: 114 mL/min/{1.73_m2} (ref >59)
GFR, EST NON AFRICAN AMERICAN: 98 mL/min/{1.73_m2} (ref >59)
GLUCOSE: 182 mg/dL — AB (ref 65–99)
Potassium: 3.6 mmol/L (ref 3.5–5.2)
Sodium: 138 mmol/L (ref 134–144)

## 2017-05-01 ENCOUNTER — Other Ambulatory Visit: Payer: Self-pay | Admitting: Family Medicine

## 2017-05-01 DIAGNOSIS — M170 Bilateral primary osteoarthritis of knee: Secondary | ICD-10-CM

## 2017-05-29 ENCOUNTER — Other Ambulatory Visit: Payer: Self-pay | Admitting: Family Medicine

## 2017-05-29 DIAGNOSIS — M170 Bilateral primary osteoarthritis of knee: Secondary | ICD-10-CM

## 2017-06-01 ENCOUNTER — Encounter: Payer: Self-pay | Admitting: Family Medicine

## 2017-06-01 ENCOUNTER — Ambulatory Visit (INDEPENDENT_AMBULATORY_CARE_PROVIDER_SITE_OTHER): Payer: BLUE CROSS/BLUE SHIELD | Admitting: Family Medicine

## 2017-06-01 VITALS — BP 125/87 | HR 92 | Ht 72.84 in | Wt 287.0 lb

## 2017-06-01 DIAGNOSIS — I10 Essential (primary) hypertension: Secondary | ICD-10-CM | POA: Diagnosis not present

## 2017-06-01 DIAGNOSIS — E1142 Type 2 diabetes mellitus with diabetic polyneuropathy: Secondary | ICD-10-CM | POA: Diagnosis not present

## 2017-06-01 DIAGNOSIS — Z1329 Encounter for screening for other suspected endocrine disorder: Secondary | ICD-10-CM | POA: Diagnosis not present

## 2017-06-01 DIAGNOSIS — M170 Bilateral primary osteoarthritis of knee: Secondary | ICD-10-CM

## 2017-06-01 DIAGNOSIS — Z0000 Encounter for general adult medical examination without abnormal findings: Secondary | ICD-10-CM

## 2017-06-01 DIAGNOSIS — E119 Type 2 diabetes mellitus without complications: Secondary | ICD-10-CM | POA: Diagnosis not present

## 2017-06-01 DIAGNOSIS — G4730 Sleep apnea, unspecified: Secondary | ICD-10-CM | POA: Diagnosis not present

## 2017-06-01 DIAGNOSIS — Z125 Encounter for screening for malignant neoplasm of prostate: Secondary | ICD-10-CM | POA: Diagnosis not present

## 2017-06-01 DIAGNOSIS — E785 Hyperlipidemia, unspecified: Secondary | ICD-10-CM | POA: Diagnosis not present

## 2017-06-01 LAB — URINALYSIS, ROUTINE W REFLEX MICROSCOPIC
BILIRUBIN UA: NEGATIVE
Ketones, UA: NEGATIVE
Leukocytes, UA: NEGATIVE
Nitrite, UA: NEGATIVE
PH UA: 7 (ref 5.0–7.5)
Protein, UA: NEGATIVE
RBC, UA: NEGATIVE
Specific Gravity, UA: 1.015 (ref 1.005–1.030)
UUROB: 0.2 mg/dL (ref 0.2–1.0)

## 2017-06-01 LAB — BAYER DCA HB A1C WAIVED: HB A1C (BAYER DCA - WAIVED): 7.9 % — ABNORMAL HIGH (ref <7.0)

## 2017-06-01 MED ORDER — SITAGLIPTIN PHOSPHATE 100 MG PO TABS: 100.0000 mg | ORAL_TABLET | Freq: Every day | ORAL | 4 refills | Status: DC

## 2017-06-01 MED ORDER — MELOXICAM 15 MG PO TABS: 15.0000 mg | ORAL_TABLET | Freq: Every day | ORAL | 2 refills | Status: DC

## 2017-06-01 MED ORDER — OLMESARTAN-AMLODIPINE-HCTZ 40-10-25 MG PO TABS: 1.0000 | ORAL_TABLET | Freq: Every day | ORAL | 4 refills | Status: DC

## 2017-06-01 MED ORDER — PITAVASTATIN CALCIUM 1 MG PO TABS: 1.0000 mg | ORAL_TABLET | Freq: Every day | ORAL | 4 refills | Status: DC

## 2017-06-01 MED ORDER — COLESEVELAM HCL 625 MG PO TABS: 1875.0000 mg | ORAL_TABLET | Freq: Two times a day (BID) | ORAL | 4 refills | Status: DC

## 2017-06-01 MED ORDER — PREGABALIN 50 MG PO CAPS: 50.0000 mg | ORAL_CAPSULE | Freq: Two times a day (BID) | ORAL | 3 refills | Status: DC

## 2017-06-01 MED ORDER — EZETIMIBE 10 MG PO TABS: 10.0000 mg | ORAL_TABLET | Freq: Every day | ORAL | 4 refills | Status: DC

## 2017-06-01 MED ORDER — SILDENAFIL CITRATE 20 MG PO TABS: 20.0000 mg | ORAL_TABLET | ORAL | 12 refills | Status: DC | PRN

## 2017-06-01 MED ORDER — METFORMIN HCL 1000 MG PO TABS: 1000.0000 mg | ORAL_TABLET | Freq: Two times a day (BID) | ORAL | 4 refills | Status: DC

## 2017-06-01 MED ORDER — DAPAGLIFLOZIN PROPANEDIOL 10 MG PO TABS: 10.0000 mg | ORAL_TABLET | Freq: Every day | ORAL | 4 refills | Status: DC

## 2017-06-01 MED ORDER — DULAGLUTIDE 1.5 MG/0.5ML ~~LOC~~ SOAJ: 1.5000 mg | SUBCUTANEOUS | 4 refills | Status: DC

## 2017-06-01 NOTE — Assessment & Plan Note (Signed)
Change to trulicity

## 2017-06-01 NOTE — Assessment & Plan Note (Signed)
The current medical regimen is effective;  continue present plan and medications.  

## 2017-06-01 NOTE — Progress Notes (Signed)
BP 125/87   Pulse 92   Ht 6' 0.83" (1.85 m)   Wt 287 lb (130.2 kg)   SpO2 98%   BMI 38.04 kg/m    Subjective:    Patient ID: Casey Hunter., male    DOB: 02-May-1956, 61 y.o.   MRN: [id number]  HPI: Takota Cahalan. is a 61 y.o. male  Chief Complaint  Patient presents with  . Annual Exam  . Medication Problem    d/c  Tanzeum   Patient doing well on Tanzeum but medication been discontinued will need to change to something else. Patient concerned about cholesterol is modified his diet to more low-cholesterol foods and seems to be doing well. Trouble is patient also is gained 11 pounds this year. Patient has tried gabapentin for diabetic peripheral neuropathy. Patient's primarily's symptoms include a sensation of swelling up and bursting. Gabapentin 300 twice a day is the maximum tolerated dose at more than that patient felt goofy not mentally with it and is unable to take 900 mg a day at 600 mg a day not having much effect on his feet. Interested in trying something else. Patient having no low blood sugar spells or issues with medications. Sleep apnea doing well using CPAP. Knee arthritis stable. Cholesterol no issues except for weight gain is been trying to eat healthier.  Relevant past medical, surgical, family and social history reviewed and updated as indicated. Interim medical history since our last visit reviewed. Allergies and medications reviewed and updated.  Review of Systems  Constitutional: Negative.   HENT: Negative.   Eyes: Negative.   Respiratory: Negative.   Cardiovascular: Negative.   Gastrointestinal: Negative.   Endocrine: Negative.   Genitourinary: Negative.   Musculoskeletal: Negative.   Skin: Negative.   Allergic/Immunologic: Negative.   Neurological: Negative.   Hematological: Negative.   Psychiatric/Behavioral: Negative.     Per HPI unless specifically indicated above     Objective:    BP 125/87   Pulse 92   Ht 6' 0.83" (1.85 m)    Wt 287 lb (130.2 kg)   SpO2 98%   BMI 38.04 kg/m   Wt Readings from Last 3 Encounters:  06/01/17 287 lb (130.2 kg)  03/14/17 283 lb (128.4 kg)  11/09/16 276 lb 9.6 oz (125.5 kg)    Physical Exam  Constitutional: He is oriented to person, place, and time. He appears well-developed and well-nourished.  HENT:  Head: Normocephalic and atraumatic.  Right Ear: External ear normal.  Left Ear: External ear normal.  Eyes: Conjunctivae and EOM are normal. Pupils are equal, round, and reactive to light.  Neck: Normal range of motion. Neck supple.  Cardiovascular: Normal rate, regular rhythm, normal heart sounds and intact distal pulses.   Pulmonary/Chest: Effort normal and breath sounds normal.  Abdominal: Soft. Bowel sounds are normal. There is no splenomegaly or hepatomegaly.  Genitourinary: Rectum normal, prostate normal and penis normal.  Musculoskeletal: Normal range of motion.  Neurological: He is alert and oriented to person, place, and time. He has normal reflexes.  Skin: No rash noted. No erythema.  Psychiatric: He has a normal mood and affect. His behavior is normal. Judgment and thought content normal.    Results for orders placed or performed in visit on 03/14/17  LP+ALT+AST Piccolo, [employer]  Result Value Ref Range   ALT (SGPT) Piccolo, Waived 33 10 - 47 U/L   AST (SGOT) Piccolo, Waived 31 11 - 38 U/L   Cholesterol Piccolo, Waived 215 (H) <200  mg/dL   HDL Chol Piccolo, Waived 51 (L) >59 mg/dL   Triglycerides Piccolo,Waived 380 (H) <150 mg/dL   Chol/HDL Ratio Piccolo,Waive 4.2 mg/dL   LDL Chol Calc Piccolo Waived 88 <100 mg/dL   VLDL Chol Calc Piccolo,Waive 76 (H) <30 mg/dL  Bayer DCA Hb Z6XA1c Waived  Result Value Ref Range   Bayer DCA Hb A1c Waived 7.9 (H) <7.0 %  Basic metabolic panel  Result Value Ref Range   Glucose 182 (H) 65 - 99 mg/dL   BUN 17 8 - 27 mg/dL   Creatinine, Ser 0.960.76 0.76 - 1.27 mg/dL   GFR calc non Af Amer 98 >59 mL/min/1.73   GFR calc Af Amer 114 >59  mL/min/1.73   BUN/Creatinine Ratio 22 10 - 24   Sodium 138 134 - 144 mmol/L   Potassium 3.6 3.5 - 5.2 mmol/L   Chloride 97 96 - 106 mmol/L   CO2 25 18 - 29 mmol/L   Calcium 9.3 8.6 - 10.2 mg/dL      Assessment & Plan:   Problem List Items Addressed This Visit      Cardiovascular and Mediastinum   Hypertension    The current medical regimen is effective;  continue present plan and medications.       Relevant Medications   colesevelam (WELCHOL) 625 MG tablet   ezetimibe (ZETIA) 10 MG tablet   Olmesartan-Amlodipine-HCTZ (TRIBENZOR) 40-10-25 MG TABS   Pitavastatin Calcium (LIVALO) 1 MG TABS   sildenafil (REVATIO) 20 MG tablet   Other Relevant Orders   Bayer DCA Hb A1c Waived   CBC with Differential/Platelet     Respiratory   Sleep apnea    The current medical regimen is effective;  continue present plan and medications.         Endocrine   Diabetes (HCC)   Relevant Medications   dapagliflozin propanediol (FARXIGA) 10 MG TABS tablet   metFORMIN (GLUCOPHAGE) 1000 MG tablet   Olmesartan-Amlodipine-HCTZ (TRIBENZOR) 40-10-25 MG TABS   Pitavastatin Calcium (LIVALO) 1 MG TABS   sitaGLIPtin (JANUVIA) 100 MG tablet   Dulaglutide (TRULICITY) 1.5 MG/0.5ML SOPN   Other Relevant Orders   Bayer DCA Hb A1c Waived   Comprehensive metabolic panel   Urinalysis, Routine w reflex microscopic   Diabetic peripheral neuropathy associated with type 2 diabetes mellitus (HCC)    Change to trulicity      Relevant Medications   dapagliflozin propanediol (FARXIGA) 10 MG TABS tablet   metFORMIN (GLUCOPHAGE) 1000 MG tablet   Olmesartan-Amlodipine-HCTZ (TRIBENZOR) 40-10-25 MG TABS   Pitavastatin Calcium (LIVALO) 1 MG TABS   sitaGLIPtin (JANUVIA) 100 MG tablet   Dulaglutide (TRULICITY) 1.5 MG/0.5ML SOPN     Musculoskeletal and Integument   Knee osteoarthritis   Relevant Medications   meloxicam (MOBIC) 15 MG tablet     Other   Hyperlipidemia    The current medical regimen is effective;   continue present plan and medications.       Relevant Medications   colesevelam (WELCHOL) 625 MG tablet   ezetimibe (ZETIA) 10 MG tablet   Olmesartan-Amlodipine-HCTZ (TRIBENZOR) 40-10-25 MG TABS   Pitavastatin Calcium (LIVALO) 1 MG TABS   sildenafil (REVATIO) 20 MG tablet   Other Relevant Orders   Bayer DCA Hb A1c Waived   Lipid panel    Other Visit Diagnoses    Prostate cancer screening    -  Primary   Relevant Orders   PSA   Thyroid disorder screen       Relevant Orders  TSH   Annual physical exam         Trial of Lyrica for diabetic peripheral neuropathy to see if that will help some. Stop gabapentin  Follow up plan: Return in about 3 months (around 09/01/2017) for Hemoglobin A1c.

## 2017-06-02 LAB — CBC WITH DIFFERENTIAL/PLATELET
BASOS ABS: 0.1 10*3/uL (ref 0.0–0.2)
Basos: 1 %
EOS (ABSOLUTE): 0.4 10*3/uL (ref 0.0–0.4)
Eos: 4 %
Hematocrit: 47 % (ref 37.5–51.0)
Hemoglobin: 15.3 g/dL (ref 13.0–17.7)
Immature Grans (Abs): 0 10*3/uL (ref 0.0–0.1)
Immature Granulocytes: 0 %
LYMPHS ABS: 2.4 10*3/uL (ref 0.7–3.1)
Lymphs: 25 %
MCH: 27.5 pg (ref 26.6–33.0)
MCHC: 32.6 g/dL (ref 31.5–35.7)
MCV: 84 fL (ref 79–97)
MONOS ABS: 0.6 10*3/uL (ref 0.1–0.9)
Monocytes: 7 %
NEUTROS ABS: 5.9 10*3/uL (ref 1.4–7.0)
Neutrophils: 63 %
Platelets: 293 10*3/uL (ref 150–379)
RBC: 5.57 x10E6/uL (ref 4.14–5.80)
RDW: 14.4 % (ref 12.3–15.4)
WBC: 9.5 10*3/uL (ref 3.4–10.8)

## 2017-06-02 LAB — COMPREHENSIVE METABOLIC PANEL
A/G RATIO: 1.1 — AB (ref 1.2–2.2)
ALT: 33 IU/L (ref 0–44)
AST: 26 IU/L (ref 0–40)
Albumin: 4 g/dL (ref 3.6–4.8)
Alkaline Phosphatase: 79 IU/L (ref 39–117)
BUN/Creatinine Ratio: 17 (ref 10–24)
BUN: 14 mg/dL (ref 8–27)
Bilirubin Total: 0.4 mg/dL (ref 0.0–1.2)
CALCIUM: 9.5 mg/dL (ref 8.6–10.2)
CO2: 19 mmol/L — AB (ref 20–29)
CREATININE: 0.84 mg/dL (ref 0.76–1.27)
Chloride: 102 mmol/L (ref 96–106)
GFR, EST AFRICAN AMERICAN: 109 mL/min/{1.73_m2} (ref >59)
GFR, EST NON AFRICAN AMERICAN: 95 mL/min/{1.73_m2} (ref >59)
GLOBULIN, TOTAL: 3.5 g/dL (ref 1.5–4.5)
Glucose: 168 mg/dL — ABNORMAL HIGH (ref 65–99)
POTASSIUM: 4.4 mmol/L (ref 3.5–5.2)
Sodium: 138 mmol/L (ref 134–144)
TOTAL PROTEIN: 7.5 g/dL (ref 6.0–8.5)

## 2017-06-02 LAB — LIPID PANEL
CHOLESTEROL TOTAL: 209 mg/dL — AB (ref 100–199)
Chol/HDL Ratio: 5 ratio (ref 0.0–5.0)
HDL: 42 mg/dL (ref >39)
LDL CALC: 89 mg/dL (ref 0–99)
TRIGLYCERIDES: 391 mg/dL — AB (ref 0–149)
VLDL Cholesterol Cal: 78 mg/dL — ABNORMAL HIGH (ref 5–40)

## 2017-06-02 LAB — PSA: Prostate Specific Ag, Serum: 1.6 ng/mL (ref 0.0–4.0)

## 2017-06-02 LAB — TSH: TSH: 1.83 u[IU]/mL (ref 0.450–4.500)

## 2017-06-05 ENCOUNTER — Encounter: Payer: Self-pay | Admitting: Family Medicine

## 2017-06-06 ENCOUNTER — Telehealth: Payer: Self-pay | Admitting: Family Medicine

## 2017-06-06 NOTE — Telephone Encounter (Signed)
Letter was sent with normal bolod work results per Dr. Dossie Arbourrissman. Will call patient and let him know.

## 2017-06-06 NOTE — Telephone Encounter (Signed)
Called and let patient know about letter and labs being normal per Dr. Dossie Arbourrissman.

## 2017-09-21 ENCOUNTER — Encounter: Payer: Self-pay | Admitting: Family Medicine

## 2017-09-21 ENCOUNTER — Ambulatory Visit (INDEPENDENT_AMBULATORY_CARE_PROVIDER_SITE_OTHER): Payer: BLUE CROSS/BLUE SHIELD | Admitting: Family Medicine

## 2017-09-21 VITALS — BP 127/83 | HR 89 | Wt 277.0 lb

## 2017-09-21 DIAGNOSIS — Z23 Encounter for immunization: Secondary | ICD-10-CM | POA: Diagnosis not present

## 2017-09-21 DIAGNOSIS — E119 Type 2 diabetes mellitus without complications: Secondary | ICD-10-CM | POA: Diagnosis not present

## 2017-09-21 DIAGNOSIS — I10 Essential (primary) hypertension: Secondary | ICD-10-CM

## 2017-09-21 DIAGNOSIS — E785 Hyperlipidemia, unspecified: Secondary | ICD-10-CM

## 2017-09-21 DIAGNOSIS — E1142 Type 2 diabetes mellitus with diabetic polyneuropathy: Secondary | ICD-10-CM

## 2017-09-21 NOTE — Patient Instructions (Signed)

## 2017-09-21 NOTE — Assessment & Plan Note (Signed)
The current medical regimen is effective;  continue present plan and medications.  

## 2017-09-21 NOTE — Assessment & Plan Note (Signed)
Patient's cholesterol is gotten worse but is lost 10 pounds and doing better with better control of his medical issues. We will continue to observe cholesterol not making any changes patient will continue good dietary changes and follow-up in 3 months at next visit.

## 2017-09-21 NOTE — Progress Notes (Signed)
BP 127/83   Pulse 89   Wt 277 lb (125.6 kg)   SpO2 94%   BMI 36.71 kg/m    Subjective:    Patient ID: Casey Greenspan., male    DOB: 1956/10/22, 61 y.o.   MRN: [id number]  HPI: Casey Coryell. is a 60 y.o. male  Chief Complaint  Patient presents with  . Follow-up  . Diabetes  . Hypertension  . Hyperlipidemia   Patient follow-up doing well with diabetes medications noted low blood sugar spells or issues. Blood pressure cholesterol also doing well with no complaints or problems. Patient is concerned about developing cold hands with unusual response to mild cold exposure and hand staying cold. Patient's father also have similar condition.  Relevant past medical, surgical, family and social history reviewed and updated as indicated. Interim medical history since our last visit reviewed. Allergies and medications reviewed and updated.  Review of Systems  Constitutional: Negative.   Respiratory: Negative.   Cardiovascular: Negative.     Per HPI unless specifically indicated above     Objective:    BP 127/83   Pulse 89   Wt 277 lb (125.6 kg)   SpO2 94%   BMI 36.71 kg/m   Wt Readings from Last 3 Encounters:  09/21/17 277 lb (125.6 kg)  06/01/17 287 lb (130.2 kg)  03/14/17 283 lb (128.4 kg)    Physical Exam  Constitutional: He is oriented to person, place, and time. He appears well-developed and well-nourished.  HENT:  Head: Normocephalic and atraumatic.  Eyes: Conjunctivae and EOM are normal.  Neck: Normal range of motion.  Cardiovascular: Normal rate, regular rhythm and normal heart sounds.   Pulmonary/Chest: Effort normal and breath sounds normal.  Musculoskeletal: Normal range of motion.  Neurological: He is alert and oriented to person, place, and time.  Skin: No erythema.  Psychiatric: He has a normal mood and affect. His behavior is normal. Judgment and thought content normal.    Results for orders placed or performed in visit on 06/01/17  Bayer  DCA Hb A1c Waived  Result Value Ref Range   Bayer DCA Hb A1c Waived 7.9 (H) <7.0 %  CBC with Differential/Platelet  Result Value Ref Range   WBC 9.5 3.4 - 10.8 x10E3/uL   RBC 5.57 4.14 - 5.80 x10E6/uL   Hemoglobin 15.3 13.0 - 17.7 g/dL   Hematocrit 13.2 44.0 - 51.0 %   MCV 84 79 - 97 fL   MCH 27.5 26.6 - 33.0 pg   MCHC 32.6 31.5 - 35.7 g/dL   RDW 10.2 72.5 - 36.6 %   Platelets 293 150 - 379 x10E3/uL   Neutrophils 63 Not Estab. %   Lymphs 25 Not Estab. %   Monocytes 7 Not Estab. %   Eos 4 Not Estab. %   Basos 1 Not Estab. %   Neutrophils Absolute 5.9 1.4 - 7.0 x10E3/uL   Lymphocytes Absolute 2.4 0.7 - 3.1 x10E3/uL   Monocytes Absolute 0.6 0.1 - 0.9 x10E3/uL   EOS (ABSOLUTE) 0.4 0.0 - 0.4 x10E3/uL   Basophils Absolute 0.1 0.0 - 0.2 x10E3/uL   Immature Granulocytes 0 Not Estab. %   Immature Grans (Abs) 0.0 0.0 - 0.1 x10E3/uL  Lipid panel  Result Value Ref Range   Cholesterol, Total 209 (H) 100 - 199 mg/dL   Triglycerides 440 (H) 0 - 149 mg/dL   HDL 42 >34 mg/dL   VLDL Cholesterol Cal 78 (H) 5 - 40 mg/dL   LDL Calculated  89 0 - 99 mg/dL   Chol/HDL Ratio 5.0 0.0 - 5.0 ratio  Comprehensive metabolic panel  Result Value Ref Range   Glucose 168 (H) 65 - 99 mg/dL   BUN 14 8 - 27 mg/dL   Creatinine, Ser 1.610.84 0.76 - 1.27 mg/dL   GFR calc non Af Amer 95 >59 mL/min/1.73   GFR calc Af Amer 109 >59 mL/min/1.73   BUN/Creatinine Ratio 17 10 - 24   Sodium 138 134 - 144 mmol/L   Potassium 4.4 3.5 - 5.2 mmol/L   Chloride 102 96 - 106 mmol/L   CO2 19 (L) 20 - 29 mmol/L   Calcium 9.5 8.6 - 10.2 mg/dL   Total Protein 7.5 6.0 - 8.5 g/dL   Albumin 4.0 3.6 - 4.8 g/dL   Globulin, Total 3.5 1.5 - 4.5 g/dL   Albumin/Globulin Ratio 1.1 (L) 1.2 - 2.2   Bilirubin Total 0.4 0.0 - 1.2 mg/dL   Alkaline Phosphatase 79 39 - 117 IU/L   AST 26 0 - 40 IU/L   ALT 33 0 - 44 IU/L  PSA  Result Value Ref Range   Prostate Specific Ag, Serum 1.6 0.0 - 4.0 ng/mL  TSH  Result Value Ref Range   TSH 1.830  0.450 - 4.500 uIU/mL  Urinalysis, Routine w reflex microscopic  Result Value Ref Range   Specific Gravity, UA 1.015 1.005 - 1.030   pH, UA 7.0 5.0 - 7.5   Color, UA Yellow Yellow   Appearance Ur Clear Clear   Leukocytes, UA Negative Negative   Protein, UA Negative Negative/Trace   Glucose, UA 3+ (A) Negative   Ketones, UA Negative Negative   RBC, UA Negative Negative   Bilirubin, UA Negative Negative   Urobilinogen, Ur 0.2 0.2 - 1.0 mg/dL   Nitrite, UA Negative Negative      Assessment & Plan:   Problem List Items Addressed This Visit      Cardiovascular and Mediastinum   Hypertension    The current medical regimen is effective;  continue present plan and medications.         Endocrine   Diabetes (HCC) - Primary   Relevant Orders   Bayer DCA Hb A1c Waived   Diabetic peripheral neuropathy associated with type 2 diabetes mellitus (HCC)    The current medical regimen is effective;  continue present plan and medications.         Other   Hyperlipidemia    Patient's cholesterol is gotten worse but is lost 10 pounds and doing better with better control of his medical issues. We will continue to observe cholesterol not making any changes patient will continue good dietary changes and follow-up in 3 months at next visit.      Relevant Orders   LP+ALT+AST Piccolo, [place]    Other Visit Diagnoses    Needs flu shot       Relevant Orders   Flu Vaccine QUAD 36+ mos IM (Completed)       Follow up plan: Return in about 3 months (around 12/22/2017) for Hemoglobin A1c, BMP,  Lipids, ALT, AST.

## 2017-09-22 LAB — LP+ALT+AST PICCOLO, WAIVED
ALT (SGPT) PICCOLO, WAIVED: 35 U/L (ref 10–47)
AST (SGOT) Piccolo, Waived: 43 U/L — ABNORMAL HIGH (ref 11–38)
Chol/HDL Ratio Piccolo,Waive: 4.1 mg/dL
Cholesterol Piccolo, Waived: 211 mg/dL — ABNORMAL HIGH (ref <200)
HDL Chol Piccolo, Waived: 51 mg/dL — ABNORMAL LOW (ref >59)
LDL Chol Calc Piccolo Waived: 126 mg/dL — ABNORMAL HIGH (ref <100)
TRIGLYCERIDES PICCOLO,WAIVED: 172 mg/dL — AB (ref <150)
VLDL CHOL CALC PICCOLO,WAIVE: 34 mg/dL — AB (ref <30)

## 2017-09-22 LAB — BAYER DCA HB A1C WAIVED: HB A1C: 7.3 % — AB (ref <7.0)

## 2017-10-16 ENCOUNTER — Other Ambulatory Visit: Payer: Self-pay | Admitting: Family Medicine

## 2017-10-16 DIAGNOSIS — E785 Hyperlipidemia, unspecified: Secondary | ICD-10-CM

## 2017-11-08 DIAGNOSIS — E113293 Type 2 diabetes mellitus with mild nonproliferative diabetic retinopathy without macular edema, bilateral: Secondary | ICD-10-CM | POA: Diagnosis not present

## 2017-11-08 LAB — HM DIABETES EYE EXAM

## 2017-11-18 ENCOUNTER — Other Ambulatory Visit: Payer: Self-pay

## 2017-11-18 DIAGNOSIS — M170 Bilateral primary osteoarthritis of knee: Secondary | ICD-10-CM

## 2017-11-18 MED ORDER — MELOXICAM 15 MG PO TABS: 15.0000 mg | ORAL_TABLET | Freq: Every day | ORAL | 2 refills | Status: DC

## 2017-12-12 ENCOUNTER — Other Ambulatory Visit: Payer: Self-pay

## 2017-12-12 MED ORDER — DULAGLUTIDE 1.5 MG/0.5ML ~~LOC~~ SOAJ: 1.5000 mg | SUBCUTANEOUS | 4 refills | Status: DC

## 2018-01-03 ENCOUNTER — Ambulatory Visit (INDEPENDENT_AMBULATORY_CARE_PROVIDER_SITE_OTHER): Payer: BLUE CROSS/BLUE SHIELD | Admitting: Family Medicine

## 2018-01-03 ENCOUNTER — Encounter: Payer: Self-pay | Admitting: Family Medicine

## 2018-01-03 VITALS — BP 133/69 | HR 76 | Wt 278.5 lb

## 2018-01-03 DIAGNOSIS — E785 Hyperlipidemia, unspecified: Secondary | ICD-10-CM | POA: Diagnosis not present

## 2018-01-03 DIAGNOSIS — Z0001 Encounter for general adult medical examination with abnormal findings: Secondary | ICD-10-CM

## 2018-01-03 DIAGNOSIS — E1142 Type 2 diabetes mellitus with diabetic polyneuropathy: Secondary | ICD-10-CM

## 2018-01-03 DIAGNOSIS — I10 Essential (primary) hypertension: Secondary | ICD-10-CM | POA: Diagnosis not present

## 2018-01-03 LAB — BAYER DCA HB A1C WAIVED: HB A1C: 8 % — AB (ref <7.0)

## 2018-01-03 LAB — LP+ALT+AST PICCOLO, WAIVED
ALT (SGPT) Piccolo, Waived: 46 U/L (ref 10–47)
AST (SGOT) PICCOLO, WAIVED: 37 U/L (ref 11–38)
CHOLESTEROL PICCOLO, WAIVED: 217 mg/dL — AB (ref <200)
Chol/HDL Ratio Piccolo,Waive: 4.2 mg/dL
HDL CHOL PICCOLO, WAIVED: 51 mg/dL — AB (ref >59)
LDL Chol Calc Piccolo Waived: 118 mg/dL — ABNORMAL HIGH (ref <100)
Triglycerides Piccolo,Waived: 239 mg/dL — ABNORMAL HIGH (ref <150)
VLDL Chol Calc Piccolo,Waive: 48 mg/dL — ABNORMAL HIGH (ref <30)

## 2018-01-03 MED ORDER — PREGABALIN 50 MG PO CAPS: 50.0000 mg | ORAL_CAPSULE | Freq: Three times a day (TID) | ORAL | 3 refills | Status: DC

## 2018-01-03 MED ORDER — LOVASTATIN 20 MG PO TABS: 20.0000 mg | ORAL_TABLET | Freq: Every day | ORAL | 1 refills | Status: DC

## 2018-01-03 NOTE — Progress Notes (Signed)
BP 133/69   Pulse 76   Wt 278 lb 8 oz (126.3 kg)   SpO2 99%   BMI 36.91 kg/m    Subjective:    Patient ID: Casey GreenspanJohn H Gambale Jr., male    DOB: [birth year], 62 y.o.   MRN: [id number]  HPI: Casey GreenspanJohn H Bilyk Jr. is a 62 y.o. male  Chief Complaint  Patient presents with  . Follow-up  . Diabetes  . Hypertension  . Hyperlipidemia  Patient follow-up diabetes doing well no complaints taking medications without problems. Blood pressure cholesterol also doing well with no complaints from medications. Tried gabapentin for peripheral neuropathy and made him feel funny and stopped that tried Lyrica which was without a coupon way too expensive. Reviewed we tentatively have a $4 coupon but still not sure  Relevant past medical, surgical, family and social history reviewed and updated as indicated. Interim medical history since our last visit reviewed. Allergies and medications reviewed and updated.  Review of Systems  Constitutional: Negative.   Respiratory: Negative.   Cardiovascular: Negative.     Per HPI unless specifically indicated above                                                                     Objective:    BP 133/69   Pulse 76   Wt 278 lb 8 oz (126.3 kg)   SpO2 99%   BMI 36.91 kg/m   Wt Readings from Last 3 Encounters:  01/03/18 278 lb 8 oz (126.3 kg)  09/21/17 277 lb (125.6 kg)  06/01/17 287 lb (130.2 kg)    Physical Exam  Constitutional: He is oriented to person, place, and time. He appears well-developed and well-nourished.  HENT:  Head: Normocephalic and atraumatic.  Eyes: Conjunctivae and EOM are normal.  Neck: Normal range of motion.  Cardiovascular: Normal rate, regular rhythm and normal heart sounds.  Pulmonary/Chest: Effort normal and breath sounds normal.  Musculoskeletal: Normal range of motion.  Neurological: He is alert and oriented to person, place, and time.  Skin: No erythema.  Psychiatric: He has a normal mood and affect. His behavior is  normal. Judgment and thought content normal.    Results for orders placed or performed in visit on 09/21/17  Bayer DCA Hb A1c Waived  Result Value Ref Range   Bayer DCA Hb A1c Waived 7.3 (H) <7.0 %  LP+ALT+AST Piccolo, Waived  Result Value Ref Range   ALT (SGPT) Piccolo, Waived 35 10 - 47 U/L   AST (SGOT) Piccolo, Waived 43 (H) 11 - 38 U/L   Cholesterol Piccolo, Waived 211 (H) <200 mg/dL   HDL Chol Piccolo, Waived 51 (L) >59 mg/dL   Triglycerides Piccolo,Waived 172 (H) <150 mg/dL   Chol/HDL Ratio Piccolo,Waive 4.1 mg/dL   LDL Chol Calc Piccolo Waived 126 (H) <100 mg/dL   VLDL Chol Calc Piccolo,Waive 34 (H) <30 mg/dL      Assessment & Plan:   Problem List Items Addressed This Visit      Cardiovascular and Mediastinum   Hypertension - Primary    The current medical regimen is effective;  continue present plan and medications.       Relevant Medications   lovastatin (MEVACOR) 20 MG tablet   Other Relevant Orders  Basic metabolic panel   Bayer DCA Hb Z6X Waived   LP+ALT+AST Piccolo, [place]     Endocrine   Diabetic peripheral neuropathy associated with type 2 diabetes mellitus (HCC)    The current medical regimen is effective;  continue present plan and medications. Also discussed Lyrica gave coupon and will assess. Discussed diabetes poor control with hemoglobin A1c of 8 patient will do better with diet.       Relevant Medications   pregabalin (LYRICA) 50 MG capsule   lovastatin (MEVACOR) 20 MG tablet   Other Relevant Orders   Basic metabolic panel   Bayer DCA Hb W9U Waived   LP+ALT+AST Piccolo, Waived     Other   Hyperlipidemia    Discussed continued poor control will try low-dose statin and co-Q10 start with lovastatin 20 mg half a tablet every third to fourth day.      Relevant Medications   lovastatin (MEVACOR) 20 MG tablet   Other Relevant Orders   Basic metabolic panel   Bayer DCA Hb E4V Waived   LP+ALT+AST Piccolo, Waived       Follow up  plan: Return in about 3 months (around 04/02/2018) for Hemoglobin A1c,   Lipids, ALT, AST.

## 2018-01-03 NOTE — Assessment & Plan Note (Addendum)
The current medical regimen is effective;  continue present plan and medications. Also discussed Lyrica gave coupon and will assess. Discussed diabetes poor control with hemoglobin A1c of 8 patient will do better with diet.

## 2018-01-03 NOTE — Assessment & Plan Note (Addendum)
Discussed continued poor control will try low-dose statin and co-Q10 start with lovastatin 20 mg half a tablet every third to fourth day.

## 2018-01-03 NOTE — Assessment & Plan Note (Signed)
The current medical regimen is effective;  continue present plan and medications.  

## 2018-01-04 ENCOUNTER — Encounter: Payer: Self-pay | Admitting: Family Medicine

## 2018-01-04 LAB — BASIC METABOLIC PANEL
BUN/Creatinine Ratio: 16 (ref 10–24)
BUN: 16 mg/dL (ref 8–27)
CO2: 23 mmol/L (ref 20–29)
CREATININE: 1.02 mg/dL (ref 0.76–1.27)
Calcium: 9.6 mg/dL (ref 8.6–10.2)
Chloride: 100 mmol/L (ref 96–106)
GFR, EST AFRICAN AMERICAN: 91 mL/min/{1.73_m2} (ref >59)
GFR, EST NON AFRICAN AMERICAN: 78 mL/min/{1.73_m2} (ref >59)
Glucose: 175 mg/dL — ABNORMAL HIGH (ref 65–99)
POTASSIUM: 4.3 mmol/L (ref 3.5–5.2)
Sodium: 139 mmol/L (ref 134–144)

## 2018-02-05 ENCOUNTER — Other Ambulatory Visit: Payer: Self-pay | Admitting: Family Medicine

## 2018-02-06 NOTE — Telephone Encounter (Signed)
Gabapentin refill Last OV: 01/03/18 Last Refill:not on med list -was dc'd 06/01/18 Pharmacy:[facility] PCP: Dr Vonita MossMark Hunter

## 2018-02-07 ENCOUNTER — Other Ambulatory Visit: Payer: Self-pay | Admitting: Family Medicine

## 2018-02-07 MED ORDER — GABAPENTIN (ONCE-DAILY) 600 MG PO TABS: 600.0000 mg | ORAL_TABLET | Freq: Two times a day (BID) | ORAL | 6 refills | Status: DC

## 2018-03-14 ENCOUNTER — Other Ambulatory Visit: Payer: Self-pay | Admitting: Family Medicine

## 2018-04-24 ENCOUNTER — Ambulatory Visit (INDEPENDENT_AMBULATORY_CARE_PROVIDER_SITE_OTHER): Payer: BLUE CROSS/BLUE SHIELD | Admitting: Family Medicine

## 2018-04-24 ENCOUNTER — Encounter: Payer: Self-pay | Admitting: Family Medicine

## 2018-04-24 VITALS — BP 110/77 | HR 74 | Ht 73.0 in | Wt 261.8 lb

## 2018-04-24 DIAGNOSIS — E1142 Type 2 diabetes mellitus with diabetic polyneuropathy: Secondary | ICD-10-CM

## 2018-04-24 DIAGNOSIS — I10 Essential (primary) hypertension: Secondary | ICD-10-CM | POA: Diagnosis not present

## 2018-04-24 DIAGNOSIS — E785 Hyperlipidemia, unspecified: Secondary | ICD-10-CM

## 2018-04-24 LAB — LP+ALT+AST PICCOLO, WAIVED
ALT (SGPT) Piccolo, Waived: 26 U/L (ref 10–47)
AST (SGOT) Piccolo, Waived: 28 U/L (ref 11–38)
CHOL/HDL RATIO PICCOLO,WAIVE: 4.1 mg/dL
Cholesterol Piccolo, Waived: 150 mg/dL (ref <200)
HDL Chol Piccolo, Waived: 37 mg/dL — ABNORMAL LOW (ref >59)
LDL CHOL CALC PICCOLO WAIVED: 79 mg/dL (ref <100)
Triglycerides Piccolo,Waived: 174 mg/dL — ABNORMAL HIGH (ref <150)
VLDL CHOL CALC PICCOLO,WAIVE: 35 mg/dL — AB (ref <30)

## 2018-04-24 LAB — BAYER DCA HB A1C WAIVED: HB A1C: 6.3 % (ref <7.0)

## 2018-04-24 NOTE — Assessment & Plan Note (Signed)
The current medical regimen is effective;  continue present plan and medications.  

## 2018-04-24 NOTE — Progress Notes (Signed)
[facility] [place]. Per patient last seen 12/18.

## 2018-04-24 NOTE — Progress Notes (Signed)
BP 110/77   Pulse 74   Ht 6\' 1"  (1.854 m)   Wt 261 lb 12.8 oz (118.8 kg)   SpO2 98%   BMI 34.54 kg/m    Subjective:    Patient ID: Casey GreenspanJohn H Cogliano Jr., male    DOB: February 27, 1956, 62 y.o.   MRN: [id number]  HPI: Casey GreenspanJohn H Hundertmark Jr. is a 62 y.o. male  Chief Complaint  Patient presents with  . Follow-up  . Diabetes  . Hyperlipidemia  Patient follow-up has lost 16 pounds by eating less and doing better no complaints no low blood sugar spells as continuous glucose monitoring which is indicated good control with blood sugars in the lower 100 range occasionally after meals gets up close to 200.  Has had an episode of blood sugar of 89 no low blood sugar spells. Taking Livalo 1 mg 2 times a week without side effects or issues. Blood pressure also doing well with no complaints no low blood pressure spe;lls  Relevant past medical, surgical, family and social history reviewed and updated as indicated. Interim medical history since our last visit reviewed. Allergies and medications reviewed and updated.  Review of Systems  Constitutional: Negative.   Respiratory: Negative.   Cardiovascular: Negative.     Per HPI unless specifically indicated above     Objective:    BP 110/77   Pulse 74   Ht 6\' 1"  (1.854 m)   Wt 261 lb 12.8 oz (118.8 kg)   SpO2 98%   BMI 34.54 kg/m   Wt Readings from Last 3 Encounters:  04/24/18 261 lb 12.8 oz (118.8 kg)  01/03/18 278 lb 8 oz (126.3 kg)  09/21/17 277 lb (125.6 kg)    Physical Exam  Constitutional: He is oriented to person, place, and time. He appears well-developed and well-nourished.  HENT:  Head: Normocephalic and atraumatic.  Eyes: Conjunctivae and EOM are normal.  Neck: Normal range of motion.  Cardiovascular: Normal rate, regular rhythm and normal heart sounds.  Pulmonary/Chest: Effort normal and breath sounds normal.  Musculoskeletal: Normal range of motion.  Neurological: He is alert and oriented to person, place, and time.  Skin:  No erythema.  Psychiatric: He has a normal mood and affect. His behavior is normal. Judgment and thought content normal.    Results for orders placed or performed in visit on 01/03/18  Basic metabolic panel  Result Value Ref Range   Glucose 175 (H) 65 - 99 mg/dL   BUN 16 8 - 27 mg/dL   Creatinine, Ser 4.091.02 0.76 - 1.27 mg/dL   GFR calc non Af Amer 78 >59 mL/min/1.73   GFR calc Af Amer 91 >59 mL/min/1.73   BUN/Creatinine Ratio 16 10 - 24   Sodium 139 134 - 144 mmol/L   Potassium 4.3 3.5 - 5.2 mmol/L   Chloride 100 96 - 106 mmol/L   CO2 23 20 - 29 mmol/L   Calcium 9.6 8.6 - 10.2 mg/dL  Bayer DCA Hb W1XA1c Waived  Result Value Ref Range   HB A1C (BAYER DCA - WAIVED) 8.0 (H) <7.0 %  LP+ALT+AST Piccolo, Waived  Result Value Ref Range   ALT (SGPT) Piccolo, Waived 46 10 - 47 U/L   AST (SGOT) Piccolo, Waived 37 11 - 38 U/L   Cholesterol Piccolo, Waived 217 (H) <200 mg/dL   HDL Chol Piccolo, Waived 51 (L) >59 mg/dL   Triglycerides Piccolo,Waived 239 (H) <150 mg/dL   Chol/HDL Ratio Piccolo,Waive 4.2 mg/dL   LDL Chol Calc Piccolo  Waived 118 (H) <100 mg/dL   VLDL Chol Calc Piccolo,Waive 48 (H) <30 mg/dL      Assessment & Plan:   Problem List Items Addressed This Visit      Cardiovascular and Mediastinum   Hypertension - Primary    The current medical regimen is effective;  continue present plan and medications.       Relevant Orders   LP+ALT+AST Piccolo, Waived   Bayer DCA Hb A1c Waived     Endocrine   Diabetic peripheral neuropathy associated with type 2 diabetes mellitus (HCC)    The current medical regimen is effective;  continue present plan and medications.       Relevant Medications   gabapentin (NEURONTIN) 600 MG tablet   Other Relevant Orders   LP+ALT+AST Piccolo, Waived   Bayer DCA Hb A1c Waived     Other   Hyperlipidemia    The current medical regimen is effective;  continue present plan and medications.       Relevant Orders   LP+ALT+AST Piccolo, Waived    Bayer DCA Hb A1c Waived    Keep up weight loss congratulations  Follow up plan: Return in about 3 months (around 07/25/2018) for Physical Exam, Hemoglobin A1c.

## 2018-05-16 ENCOUNTER — Encounter: Payer: Self-pay | Admitting: Family Medicine

## 2018-06-25 ENCOUNTER — Other Ambulatory Visit: Payer: Self-pay | Admitting: Family Medicine

## 2018-06-25 DIAGNOSIS — E785 Hyperlipidemia, unspecified: Secondary | ICD-10-CM

## 2018-06-25 DIAGNOSIS — I10 Essential (primary) hypertension: Secondary | ICD-10-CM

## 2018-06-25 DIAGNOSIS — E119 Type 2 diabetes mellitus without complications: Secondary | ICD-10-CM

## 2018-06-26 NOTE — Telephone Encounter (Signed)
Ezetimibe (Zetia) refill Last Refill:06/01/17 # 90 4 RF Last OV: 04/24/18 PCP: Vonita MossMark Crissman MD [facility] [address] [facility] [address]  farxiga refill Last Refill:06/01/17 # 90 4 RF Last OV: 04/24/18 Last Hgb A1C: 04/24/18= 6.3   Olmesartan-amlodipine-HCTZ refill Last Refill:06/01/17 # 90 4 RF Last OV: 04/24/18

## 2018-07-17 ENCOUNTER — Ambulatory Visit: Payer: BLUE CROSS/BLUE SHIELD | Admitting: Family Medicine

## 2018-07-25 ENCOUNTER — Ambulatory Visit (INDEPENDENT_AMBULATORY_CARE_PROVIDER_SITE_OTHER): Payer: BLUE CROSS/BLUE SHIELD | Admitting: Family Medicine

## 2018-07-25 ENCOUNTER — Encounter: Payer: Self-pay | Admitting: Family Medicine

## 2018-07-25 VITALS — BP 115/81 | HR 78 | Ht 73.0 in | Wt 265.0 lb

## 2018-07-25 DIAGNOSIS — I10 Essential (primary) hypertension: Secondary | ICD-10-CM | POA: Diagnosis not present

## 2018-07-25 DIAGNOSIS — E785 Hyperlipidemia, unspecified: Secondary | ICD-10-CM

## 2018-07-25 DIAGNOSIS — Z114 Encounter for screening for human immunodeficiency virus [HIV]: Secondary | ICD-10-CM | POA: Diagnosis not present

## 2018-07-25 DIAGNOSIS — E1142 Type 2 diabetes mellitus with diabetic polyneuropathy: Secondary | ICD-10-CM

## 2018-07-25 DIAGNOSIS — E119 Type 2 diabetes mellitus without complications: Secondary | ICD-10-CM

## 2018-07-25 DIAGNOSIS — Z23 Encounter for immunization: Secondary | ICD-10-CM

## 2018-07-25 LAB — BAYER DCA HB A1C WAIVED: HB A1C (BAYER DCA - WAIVED): 6.7 % (ref <7.0)

## 2018-07-25 MED ORDER — LOVASTATIN 20 MG PO TABS: 20.0000 mg | ORAL_TABLET | Freq: Every day | ORAL | 1 refills | Status: DC

## 2018-07-25 MED ORDER — COLESEVELAM HCL 625 MG PO TABS: 1875.0000 mg | ORAL_TABLET | Freq: Two times a day (BID) | ORAL | 1 refills | Status: DC

## 2018-07-25 MED ORDER — SITAGLIPTIN PHOSPHATE 100 MG PO TABS: 100.0000 mg | ORAL_TABLET | Freq: Every day | ORAL | 1 refills | Status: DC

## 2018-07-25 MED ORDER — EZETIMIBE 10 MG PO TABS: 10.0000 mg | ORAL_TABLET | Freq: Every day | ORAL | 1 refills | Status: DC

## 2018-07-25 MED ORDER — DAPAGLIFLOZIN PROPANEDIOL 10 MG PO TABS: 10.0000 mg | ORAL_TABLET | Freq: Every day | ORAL | 1 refills | Status: DC

## 2018-07-25 MED ORDER — DULAGLUTIDE 1.5 MG/0.5ML ~~LOC~~ SOAJ: 1.5000 mg | SUBCUTANEOUS | 4 refills | Status: DC

## 2018-07-25 MED ORDER — METFORMIN HCL 1000 MG PO TABS: 1000.0000 mg | ORAL_TABLET | Freq: Two times a day (BID) | ORAL | 1 refills | Status: DC

## 2018-07-25 MED ORDER — OLMESARTAN-AMLODIPINE-HCTZ 40-10-25 MG PO TABS: 1.0000 | ORAL_TABLET | Freq: Every day | ORAL | 1 refills | Status: DC

## 2018-07-25 NOTE — Assessment & Plan Note (Signed)
The current medical regimen is effective;  continue present plan and medications.  

## 2018-07-25 NOTE — Progress Notes (Signed)
BP 115/81   Pulse 78   Ht 6\' 1"  (1.854 m)   Wt 265 lb (120.2 kg)   SpO2 95%   BMI 34.96 kg/m    Subjective:    Patient ID: Casey Greenspan., male    DOB: 12-02-1955, 62 y.o.   MRN: [id number]  HPI: Casey Zulauf. is a 62 y.o. male  Chief Complaint  Patient presents with  . Follow-up  . Diabetes  Patient follow-up doing well with diabetes no low blood sugar spells are issues taking medications without problems especially Trulicity is working well.  Has not been able to lose any weight but on the other hand is not gaining weight either. Taking medications for cholesterol without problems and blood pressure also without problems and good control of blood pressure.   Relevant past medical, surgical, family and social history reviewed and updated as indicated. Interim medical history since our last visit reviewed. Allergies and medications reviewed and updated.  Review of Systems  Constitutional: Negative.   Respiratory: Negative.   Cardiovascular: Negative.     Per HPI unless specifically indicated above     Objective:    BP 115/81   Pulse 78   Ht 6\' 1"  (1.854 m)   Wt 265 lb (120.2 kg)   SpO2 95%   BMI 34.96 kg/m   Wt Readings from Last 3 Encounters:  07/25/18 265 lb (120.2 kg)  04/24/18 261 lb 12.8 oz (118.8 kg)  01/03/18 278 lb 8 oz (126.3 kg)    Physical Exam  Constitutional: He is oriented to person, place, and time. He appears well-developed and well-nourished.  HENT:  Head: Normocephalic and atraumatic.  Eyes: Conjunctivae and EOM are normal.  Neck: Normal range of motion.  Cardiovascular: Normal rate, regular rhythm and normal heart sounds.  Pulmonary/Chest: Effort normal and breath sounds normal.  Musculoskeletal: Normal range of motion.  Neurological: He is alert and oriented to person, place, and time.  Skin: No erythema.  Psychiatric: He has a normal mood and affect. His behavior is normal. Judgment and thought content normal.    Results  for orders placed or performed in visit on 04/26/18  HM DIABETES EYE EXAM  Result Value Ref Range   HM Diabetic Eye Exam No Retinopathy No Retinopathy      Assessment & Plan:   Problem List Items Addressed This Visit      Cardiovascular and Mediastinum   Hypertension    The current medical regimen is effective;  continue present plan and medications.       Relevant Medications   Olmesartan-amLODIPine-HCTZ 40-10-25 MG TABS   lovastatin (MEVACOR) 20 MG tablet   ezetimibe (ZETIA) 10 MG tablet   colesevelam (WELCHOL) 625 MG tablet   Other Relevant Orders   Bayer DCA Hb A1c Waived     Endocrine   Diabetes (HCC) - Primary   Relevant Medications   sitaGLIPtin (JANUVIA) 100 MG tablet   Olmesartan-amLODIPine-HCTZ 40-10-25 MG TABS   metFORMIN (GLUCOPHAGE) 1000 MG tablet   lovastatin (MEVACOR) 20 MG tablet   dapagliflozin propanediol (FARXIGA) 10 MG TABS tablet   Dulaglutide (TRULICITY) 1.5 MG/0.5ML SOPN   Other Relevant Orders   Bayer DCA Hb A1c Waived   Diabetic peripheral neuropathy associated with type 2 diabetes mellitus (HCC)    The current medical regimen is effective;  continue present plan and medications.       Relevant Medications   sitaGLIPtin (JANUVIA) 100 MG tablet   Olmesartan-amLODIPine-HCTZ 40-10-25 MG TABS  metFORMIN (GLUCOPHAGE) 1000 MG tablet   lovastatin (MEVACOR) 20 MG tablet   dapagliflozin propanediol (FARXIGA) 10 MG TABS tablet   Dulaglutide (TRULICITY) 1.5 MG/0.5ML SOPN     Other   Hyperlipidemia    The current medical regimen is effective;  continue present plan and medications.       Relevant Medications   Olmesartan-amLODIPine-HCTZ 40-10-25 MG TABS   lovastatin (MEVACOR) 20 MG tablet   ezetimibe (ZETIA) 10 MG tablet   colesevelam (WELCHOL) 625 MG tablet   Other Relevant Orders   Bayer DCA Hb A1c Waived    Other Visit Diagnoses    Encounter for screening for HIV       Needs flu shot           Follow up plan: Return in about 3  months (around 10/24/2018) for Physical Exam, Hemoglobin A1c.

## 2018-09-18 ENCOUNTER — Other Ambulatory Visit: Payer: Self-pay

## 2018-09-18 DIAGNOSIS — E785 Hyperlipidemia, unspecified: Secondary | ICD-10-CM

## 2018-09-18 MED ORDER — NIACIN ER (ANTIHYPERLIPIDEMIC) 1000 MG PO TBCR: 1000.0000 mg | EXTENDED_RELEASE_TABLET | Freq: Every day | ORAL | 5 refills | Status: DC

## 2018-09-18 NOTE — Telephone Encounter (Signed)
Last visit 07/25/2018

## 2018-10-11 ENCOUNTER — Telehealth: Payer: Self-pay

## 2018-10-11 MED ORDER — PREGABALIN 50 MG PO CAPS: 50.0000 mg | ORAL_CAPSULE | Freq: Three times a day (TID) | ORAL | 5 refills | Status: DC

## 2018-10-11 NOTE — Telephone Encounter (Signed)
[facility] Lyrica prescription stating, "He never got this medication filled because it was too expensive. Now it is 616 months old so we need a new RX. Since it is now generic it will be cheaper. Thanks, Page"  [facility] if appropriate.

## 2018-11-07 DIAGNOSIS — E113393 Type 2 diabetes mellitus with moderate nonproliferative diabetic retinopathy without macular edema, bilateral: Secondary | ICD-10-CM | POA: Diagnosis not present

## 2018-12-12 ENCOUNTER — Encounter: Payer: BLUE CROSS/BLUE SHIELD | Admitting: Family Medicine

## 2019-01-05 ENCOUNTER — Other Ambulatory Visit: Payer: Self-pay | Admitting: Family Medicine

## 2019-01-05 DIAGNOSIS — M170 Bilateral primary osteoarthritis of knee: Secondary | ICD-10-CM

## 2019-02-01 ENCOUNTER — Other Ambulatory Visit: Payer: Self-pay | Admitting: Family Medicine

## 2019-02-01 DIAGNOSIS — E785 Hyperlipidemia, unspecified: Secondary | ICD-10-CM

## 2019-02-02 ENCOUNTER — Other Ambulatory Visit: Payer: Self-pay

## 2019-02-02 DIAGNOSIS — E785 Hyperlipidemia, unspecified: Secondary | ICD-10-CM

## 2019-02-02 MED ORDER — EZETIMIBE 10 MG PO TABS: 10.0000 mg | ORAL_TABLET | Freq: Every day | ORAL | 0 refills | Status: DC

## 2019-02-02 NOTE — Telephone Encounter (Signed)
Patient would like a refill has a CPE scheduled for April

## 2019-03-05 ENCOUNTER — Encounter: Payer: Self-pay | Admitting: Family Medicine

## 2019-03-05 ENCOUNTER — Ambulatory Visit (INDEPENDENT_AMBULATORY_CARE_PROVIDER_SITE_OTHER): Payer: BLUE CROSS/BLUE SHIELD | Admitting: Family Medicine

## 2019-03-05 DIAGNOSIS — E119 Type 2 diabetes mellitus without complications: Secondary | ICD-10-CM

## 2019-03-05 DIAGNOSIS — E1142 Type 2 diabetes mellitus with diabetic polyneuropathy: Secondary | ICD-10-CM

## 2019-03-05 DIAGNOSIS — I10 Essential (primary) hypertension: Secondary | ICD-10-CM | POA: Diagnosis not present

## 2019-03-05 DIAGNOSIS — E785 Hyperlipidemia, unspecified: Secondary | ICD-10-CM | POA: Diagnosis not present

## 2019-03-05 DIAGNOSIS — E7800 Pure hypercholesterolemia, unspecified: Secondary | ICD-10-CM

## 2019-03-05 MED ORDER — DAPAGLIFLOZIN PROPANEDIOL 10 MG PO TABS: 10.0000 mg | ORAL_TABLET | Freq: Every day | ORAL | 1 refills | Status: DC

## 2019-03-05 MED ORDER — LOVASTATIN 20 MG PO TABS: 20.0000 mg | ORAL_TABLET | Freq: Every day | ORAL | 2 refills | Status: DC

## 2019-03-05 MED ORDER — EZETIMIBE 10 MG PO TABS: 10.0000 mg | ORAL_TABLET | Freq: Every day | ORAL | 2 refills | Status: DC

## 2019-03-05 MED ORDER — COLESEVELAM HCL 625 MG PO TABS: 1875.0000 mg | ORAL_TABLET | Freq: Two times a day (BID) | ORAL | 1 refills | Status: DC

## 2019-03-05 MED ORDER — METFORMIN HCL 1000 MG PO TABS: 1000.0000 mg | ORAL_TABLET | Freq: Two times a day (BID) | ORAL | 1 refills | Status: DC

## 2019-03-05 MED ORDER — NIACIN ER (ANTIHYPERLIPIDEMIC) 1000 MG PO TBCR: 1000.0000 mg | EXTENDED_RELEASE_TABLET | Freq: Every day | ORAL | 2 refills | Status: DC

## 2019-03-05 MED ORDER — OLMESARTAN-AMLODIPINE-HCTZ 40-10-25 MG PO TABS: 1.0000 | ORAL_TABLET | Freq: Every day | ORAL | 1 refills | Status: DC

## 2019-03-05 MED ORDER — PITAVASTATIN CALCIUM 1 MG PO TABS: 1.0000 mg | ORAL_TABLET | Freq: Every day | ORAL | 2 refills | Status: DC

## 2019-03-05 MED ORDER — PREGABALIN 50 MG PO CAPS: 50.0000 mg | ORAL_CAPSULE | Freq: Three times a day (TID) | ORAL | 2 refills | Status: DC

## 2019-03-05 MED ORDER — SITAGLIPTIN PHOSPHATE 100 MG PO TABS: 100.0000 mg | ORAL_TABLET | Freq: Every day | ORAL | 1 refills | Status: DC

## 2019-03-05 MED ORDER — DULAGLUTIDE 1.5 MG/0.5ML ~~LOC~~ SOAJ: 1.5000 mg | SUBCUTANEOUS | 4 refills | Status: DC

## 2019-03-05 NOTE — Progress Notes (Addendum)
There were no vitals taken for this visit.   Subjective:    Patient ID: Casey GreenspanJohn H Schoneman Jr., male    DOB: 06-Mar-1956, 63 y.o.   MRN: [id number]  HPI: Casey GreenspanJohn H Dennington Jr. is a 63 y.o. male  Med check  Patient doing well all in all no complaints diabetes doing well no low blood sugar spells feels that is doing as well as he had the last 2 A1c checks.  Has been watching his diet and doing well. Cholesterol doing well with no complaints from medications. Blood pressure also doing well without complaints. Patient cautious about COVID-19 react restrictions  Telemedicine using audio/video telecommunications for a synchronous communication visit. Today's visit due to COVID-19 isolation precautions I connected with and verified that I am speaking with the correct person using two identifiers.   I discussed the limitations, risks, security and privacy concerns of performing an evaluation and management service by telecommunication and the availability of in person appointments. I also discussed with the patient that there may be a patient responsible charge related to this service. The patient expressed understanding and agreed to proceed. The patient's location is work. I am at home.  Relevant past medical, surgical, family and social history reviewed and updated as indicated. Interim medical history since our last visit reviewed. Allergies and medications reviewed and updated.  Review of Systems  Constitutional: Negative.   Respiratory: Negative.   Cardiovascular: Negative.     Per HPI unless specifically indicated above     Objective:    There were no vitals taken for this visit.  Wt Readings from Last 3 Encounters:  07/25/18 265 lb (120.2 kg)  04/24/18 261 lb 12.8 oz (118.8 kg)  01/03/18 278 lb 8 oz (126.3 kg)    Physical Exam  Results for orders placed or performed in visit on 07/25/18  Bayer DCA Hb A1c Waived  Result Value Ref Range   HB A1C (BAYER DCA - WAIVED) 6.7 <7.0 %       Assessment & Plan:   Problem List Items Addressed This Visit      Cardiovascular and Mediastinum   Hypertension    The current medical regimen is effective;  continue present plan and medications. a      Relevant Medications   Pitavastatin Calcium (LIVALO) 1 MG TABS   Olmesartan-amLODIPine-HCTZ 40-10-25 MG TABS   colesevelam (WELCHOL) 625 MG tablet   niacin (NIASPAN) 1000 MG CR tablet   lovastatin (MEVACOR) 20 MG tablet   ezetimibe (ZETIA) 10 MG tablet     Nervous and Auditory   Diabetic peripheral neuropathy associated with type 2 diabetes mellitus (HCC)    The current medical regimen is effective;  continue present plan and medications.       Relevant Medications   sitaGLIPtin (JANUVIA) 100 MG tablet   Pitavastatin Calcium (LIVALO) 1 MG TABS   Olmesartan-amLODIPine-HCTZ 40-10-25 MG TABS   metFORMIN (GLUCOPHAGE) 1000 MG tablet   Dulaglutide (TRULICITY) 1.5 MG/0.5ML SOPN   dapagliflozin propanediol (FARXIGA) 10 MG TABS tablet   pregabalin (LYRICA) 50 MG capsule   lovastatin (MEVACOR) 20 MG tablet     Other   Hypercholesteremia    The current medical regimen is effective;  continue present plan and medications.       Relevant Medications   Pitavastatin Calcium (LIVALO) 1 MG TABS   Olmesartan-amLODIPine-HCTZ 40-10-25 MG TABS   colesevelam (WELCHOL) 625 MG tablet   niacin (NIASPAN) 1000 MG CR tablet   lovastatin (MEVACOR) 20 MG tablet  ezetimibe (ZETIA) 10 MG tablet    Other Visit Diagnoses    Type 2 diabetes mellitus without complication, without long-term current use of insulin (HCC)       Relevant Medications   sitaGLIPtin (JANUVIA) 100 MG tablet   Pitavastatin Calcium (LIVALO) 1 MG TABS   Olmesartan-amLODIPine-HCTZ 40-10-25 MG TABS   metFORMIN (GLUCOPHAGE) 1000 MG tablet   Dulaglutide (TRULICITY) 1.5 MG/0.5ML SOPN   dapagliflozin propanediol (FARXIGA) 10 MG TABS tablet   lovastatin (MEVACOR) 20 MG tablet     I discussed the assessment and treatment  plan with the patient. The patient was provided an opportunity to ask questions and all were answered. The patient agreed with the plan and demonstrated an understanding of the instructions.   The patient was advised to call back or seek an in-person evaluation if the symptoms worsen or if the condition fails to improve as anticipated.   I provided 21+ minutes of time during this encounter.  Follow up plan: Return in about 3 months (around 06/04/2019) for Physical Exam, Hemoglobin A1c.

## 2019-03-05 NOTE — Assessment & Plan Note (Signed)
The current medical regimen is effective;  continue present plan and medications. a 

## 2019-03-05 NOTE — Assessment & Plan Note (Signed)
The current medical regimen is effective;  continue present plan and medications.  

## 2019-03-06 ENCOUNTER — Telehealth: Payer: Self-pay

## 2019-03-06 NOTE — Telephone Encounter (Signed)
Fax from [facility] regarding Livalo (pitavastatin) and Lovastatin prescriptions. They have prescriptions for both on file, patient has always been on Lovastatin. They want to know if the Livalo is replacing the Lovastatin or which the patient should be on. Please advise.

## 2019-03-07 ENCOUNTER — Other Ambulatory Visit: Payer: Self-pay | Admitting: Family Medicine

## 2019-03-07 NOTE — Telephone Encounter (Signed)
Call [facility] stop livalo

## 2019-03-07 NOTE — Progress Notes (Signed)
D/clivlo

## 2019-03-07 NOTE — Telephone Encounter (Signed)
Pharmacy notified.

## 2019-04-27 ENCOUNTER — Other Ambulatory Visit: Payer: Self-pay

## 2019-04-27 NOTE — Telephone Encounter (Signed)
Ok this product please

## 2019-04-27 NOTE — Telephone Encounter (Signed)
Patient last seen 03/05/19.

## 2019-05-28 ENCOUNTER — Other Ambulatory Visit: Payer: Self-pay

## 2019-05-28 DIAGNOSIS — E785 Hyperlipidemia, unspecified: Secondary | ICD-10-CM

## 2019-05-28 NOTE — Telephone Encounter (Addendum)
Medication refill for Freestyle Libre 14 day sensor and Welchol 625mg  tab Last Visit: 03/05/2019

## 2019-05-29 MED ORDER — FREESTYLE LIBRE 14 DAY SENSOR MISC: 1.0000 | 12 refills | Status: DC

## 2019-06-21 ENCOUNTER — Other Ambulatory Visit: Payer: Self-pay | Admitting: Family Medicine

## 2019-06-21 DIAGNOSIS — E119 Type 2 diabetes mellitus without complications: Secondary | ICD-10-CM

## 2019-07-10 ENCOUNTER — Other Ambulatory Visit: Payer: Self-pay

## 2019-07-10 ENCOUNTER — Ambulatory Visit (INDEPENDENT_AMBULATORY_CARE_PROVIDER_SITE_OTHER): Payer: BC Managed Care – PPO | Admitting: Family Medicine

## 2019-07-10 ENCOUNTER — Encounter: Payer: Self-pay | Admitting: Family Medicine

## 2019-07-10 DIAGNOSIS — E119 Type 2 diabetes mellitus without complications: Secondary | ICD-10-CM

## 2019-07-10 DIAGNOSIS — G4730 Sleep apnea, unspecified: Secondary | ICD-10-CM

## 2019-07-10 DIAGNOSIS — E1142 Type 2 diabetes mellitus with diabetic polyneuropathy: Secondary | ICD-10-CM

## 2019-07-10 DIAGNOSIS — Z0000 Encounter for general adult medical examination without abnormal findings: Secondary | ICD-10-CM

## 2019-07-10 DIAGNOSIS — I10 Essential (primary) hypertension: Secondary | ICD-10-CM | POA: Diagnosis not present

## 2019-07-10 DIAGNOSIS — M170 Bilateral primary osteoarthritis of knee: Secondary | ICD-10-CM

## 2019-07-10 DIAGNOSIS — E785 Hyperlipidemia, unspecified: Secondary | ICD-10-CM | POA: Diagnosis not present

## 2019-07-10 DIAGNOSIS — E7800 Pure hypercholesterolemia, unspecified: Secondary | ICD-10-CM

## 2019-07-10 MED ORDER — FARXIGA 10 MG PO TABS: 10.0000 mg | ORAL_TABLET | Freq: Every day | ORAL | 4 refills | Status: DC

## 2019-07-10 MED ORDER — EZETIMIBE 10 MG PO TABS: 10.0000 mg | ORAL_TABLET | Freq: Every day | ORAL | 4 refills | Status: DC

## 2019-07-10 MED ORDER — SITAGLIPTIN PHOSPHATE 100 MG PO TABS: 100.0000 mg | ORAL_TABLET | Freq: Every day | ORAL | 4 refills | Status: DC

## 2019-07-10 MED ORDER — NIACIN ER (ANTIHYPERLIPIDEMIC) 1000 MG PO TBCR: 1000.0000 mg | EXTENDED_RELEASE_TABLET | Freq: Every day | ORAL | 4 refills | Status: DC

## 2019-07-10 MED ORDER — TRULICITY 1.5 MG/0.5ML ~~LOC~~ SOAJ: 1.5000 mg | SUBCUTANEOUS | 4 refills | Status: DC

## 2019-07-10 MED ORDER — PREGABALIN 50 MG PO CAPS: 50.0000 mg | ORAL_CAPSULE | Freq: Three times a day (TID) | ORAL | 1 refills | Status: DC

## 2019-07-10 MED ORDER — OLMESARTAN-AMLODIPINE-HCTZ 40-10-25 MG PO TABS: 1.0000 | ORAL_TABLET | Freq: Every day | ORAL | 4 refills | Status: DC

## 2019-07-10 MED ORDER — LOVASTATIN 20 MG PO TABS: 20.0000 mg | ORAL_TABLET | Freq: Every day | ORAL | 4 refills | Status: DC

## 2019-07-10 MED ORDER — MELOXICAM 15 MG PO TABS: 15.0000 mg | ORAL_TABLET | Freq: Every day | ORAL | 4 refills | Status: DC

## 2019-07-10 MED ORDER — METFORMIN HCL 1000 MG PO TABS: 1000.0000 mg | ORAL_TABLET | Freq: Two times a day (BID) | ORAL | 4 refills | Status: DC

## 2019-07-10 MED ORDER — COLESEVELAM HCL 625 MG PO TABS: 1875.0000 mg | ORAL_TABLET | Freq: Two times a day (BID) | ORAL | 4 refills | Status: DC

## 2019-07-10 NOTE — Assessment & Plan Note (Signed)
The current medical regimen is effective;  continue present plan and medications.  

## 2019-07-10 NOTE — Assessment & Plan Note (Signed)
Patient sleeps well uses CPAP without problems no daytime drowsiness.

## 2019-07-10 NOTE — Progress Notes (Addendum)
There were no vitals taken for this visit.   Subjective:    Patient ID: Casey GreenspanJohn H Nichelson Jr., male    DOB: 17-Dec-1955, 63 y.o.   MRN: [id number]  HPI: Casey GreenspanJohn H Bolio Jr. is a 63 y.o. male  Med check  Discussed with patient all in all doing well sleep apnea no complaints uses CPAP faithfully and no daytime drowsiness. Diabetes no low blood sugar spells. Cholesterol blood pressure doing well no complaints.  Relevant past medical, surgical, family and social history reviewed and updated as indicated. Interim medical history since our last visit reviewed. Allergies and medications reviewed and updated.  Review of Systems  Constitutional: Negative.   HENT: Negative.   Eyes: Negative.   Respiratory: Negative.   Cardiovascular: Negative.   Gastrointestinal: Negative.   Endocrine: Negative.   Genitourinary: Negative.   Musculoskeletal: Negative.   Skin: Negative.   Allergic/Immunologic: Negative.   Neurological: Negative.   Hematological: Negative.   Psychiatric/Behavioral: Negative.     Per HPI unless specifically indicated above     Objective:    There were no vitals taken for this visit.  Wt Readings from Last 3 Encounters:  07/25/18 265 lb (120.2 kg)  04/24/18 261 lb 12.8 oz (118.8 kg)  01/03/18 278 lb 8 oz (126.3 kg)    Physical Exam  Results for orders placed or performed in visit on 07/25/18  Bayer DCA Hb A1c Waived  Result Value Ref Range   HB A1C (BAYER DCA - WAIVED) 6.7 <7.0 %      Assessment & Plan:   Problem List Items Addressed This Visit      Cardiovascular and Mediastinum   Hypertension    The current medical regimen is effective;  continue present plan and medications.       Relevant Medications   lovastatin (MEVACOR) 20 MG tablet   ezetimibe (ZETIA) 10 MG tablet   colesevelam (WELCHOL) 625 MG tablet   niacin (NIASPAN) 1000 MG CR tablet   Olmesartan-amLODIPine-HCTZ 40-10-25 MG TABS     Respiratory   Sleep apnea    Patient sleeps well  uses CPAP without problems no daytime drowsiness.        Nervous and Auditory   Diabetic peripheral neuropathy associated with type 2 diabetes mellitus (HCC)    The current medical regimen is effective;  continue present plan and medications.       Relevant Medications   dapagliflozin propanediol (FARXIGA) 10 MG TABS tablet   pregabalin (LYRICA) 50 MG capsule   lovastatin (MEVACOR) 20 MG tablet   sitaGLIPtin (JANUVIA) 100 MG tablet   Dulaglutide (TRULICITY) 1.5 MG/0.5ML SOPN   metFORMIN (GLUCOPHAGE) 1000 MG tablet   Olmesartan-amLODIPine-HCTZ 40-10-25 MG TABS   Other Relevant Orders   Bayer DCA Hb A1c Waived     Musculoskeletal and Integument   Knee osteoarthritis   Relevant Medications   meloxicam (MOBIC) 15 MG tablet     Other   Hypercholesteremia    The current medical regimen is effective;  continue present plan and medications.       Relevant Medications   lovastatin (MEVACOR) 20 MG tablet   ezetimibe (ZETIA) 10 MG tablet   colesevelam (WELCHOL) 625 MG tablet   niacin (NIASPAN) 1000 MG CR tablet   Olmesartan-amLODIPine-HCTZ 40-10-25 MG TABS    Other Visit Diagnoses    PE (physical exam), annual    -  Primary   Relevant Orders   Comprehensive metabolic panel   Lipid panel   CBC with Differential/Platelet  TSH   Urinalysis, Routine w reflex microscopic   PSA   Type 2 diabetes mellitus without complication, without long-term current use of insulin (HCC)       Relevant Medications   dapagliflozin propanediol (FARXIGA) 10 MG TABS tablet   lovastatin (MEVACOR) 20 MG tablet   sitaGLIPtin (JANUVIA) 100 MG tablet   Dulaglutide (TRULICITY) 1.5 WN/4.6EV SOPN   metFORMIN (GLUCOPHAGE) 1000 MG tablet   Olmesartan-amLODIPine-HCTZ 40-10-25 MG TABS   Hyperlipidemia, unspecified hyperlipidemia type       Relevant Medications   lovastatin (MEVACOR) 20 MG tablet   ezetimibe (ZETIA) 10 MG tablet   colesevelam (WELCHOL) 625 MG tablet   niacin (NIASPAN) 1000 MG CR tablet    Olmesartan-amLODIPine-HCTZ 40-10-25 MG TABS   Hyperlipidemia       Relevant Medications   lovastatin (MEVACOR) 20 MG tablet   ezetimibe (ZETIA) 10 MG tablet   colesevelam (WELCHOL) 625 MG tablet   niacin (NIASPAN) 1000 MG CR tablet   Olmesartan-amLODIPine-HCTZ 40-10-25 MG TABS      Telemedicine using audio/video telecommunications for a synchronous communication visit. Today's visit due to COVID-19 isolation precautions I connected with and verified that I am speaking with the correct person using two identifiers.   I discussed the limitations, risks, security and privacy concerns of performing an evaluation and management service by telecommunication and the availability of in person appointments. I also discussed with the patient that there may be a patient responsible charge related to this service. The patient expressed understanding and agreed to proceed. The patient's location is work. I am at home.   I discussed the assessment and treatment plan with the patient. The patient was provided an opportunity to ask questions and all were answered. The patient agreed with the plan and demonstrated an understanding of the instructions.   The patient was advised to call back or seek an in-person evaluation if the symptoms worsen or if the condition fails to improve as anticipated.   I provided 21+ minutes of time during this encounter. Follow up plan: Return in about 6 months (around 01/10/2020) for Physical Exam, soon then in 6 mo, Hemoglobin A1c, BMP,  Lipids, ALT, AST.

## 2019-07-20 ENCOUNTER — Other Ambulatory Visit: Payer: Self-pay | Admitting: Family Medicine

## 2019-07-20 NOTE — Telephone Encounter (Signed)
Routing to provider  

## 2019-07-20 NOTE — Telephone Encounter (Signed)
Requested medication (s) are due for refill today: yes  Requested medication (s) are on the active medication list: no  Last refill:  06/21/2019  Future visit scheduled: yes  Notes to clinic:  Amlodipine is not on the current medication list Review for refill  Requested Prescriptions  Pending Prescriptions Disp Refills   amLODipine (NORVASC) 10 MG tablet [Pharmacy Med Name: AMLODIPINE BESYLATE 10 MG TAB] 30 tablet 0    Sig: TAKE 1 TABLET DAILY.     Cardiovascular:  Calcium Channel Blockers Passed - 07/20/2019  2:46 PM      Passed - Last BP in normal range    BP Readings from Last 1 Encounters:  07/25/18 115/81         Passed - Valid encounter within last 6 months    Recent Outpatient Visits          1 week ago PE (physical exam), annual   [facility], Jeannette How, MD   4 months ago Type 2 diabetes mellitus without complication, without long-term current use of insulin ([place])   [facility], Jeannette How, MD   12 months ago Type 2 diabetes mellitus without complication, without long-term current use of insulin ([place])   [place] [facility], Jeannette How, MD   1 year ago Essential hypertension   [place] [facility], Jeannette How, MD   1 year ago Essential hypertension   [place], [place]      Future Appointments            In 1 month Cannady, Barbaraann Faster, NP [place]            olmesartan-hydrochlorothiazide (BENICAR HCT) 40-25 MG tablet [Pharmacy Med Name: OLMESARTAN-HCTZ 40-25 MG TAB] 30 tablet 0    Sig: TAKE 1 TABLET DAILY.     Cardiovascular: ARB + Diuretic Combos Failed - 07/20/2019  2:46 PM      Failed - K in normal range and within 180 days    Potassium  Date Value Ref Range Status  01/03/2018 4.3 3.5 - 5.2 mmol/L Final  11/22/2012 3.8 3.5 - 5.1 mmol/L Final         Failed - Na in normal range and within 180 days    Sodium  Date Value Ref Range Status  01/03/2018  139 134 - 144 mmol/L Final  11/22/2012 139 136 - 145 mmol/L Final         Failed - Cr in normal range and within 180 days    Creatinine  Date Value Ref Range Status  11/22/2012 0.94 0.60 - 1.30 mg/dL Final   Creatinine, Ser  Date Value Ref Range Status  01/03/2018 1.02 0.76 - 1.27 mg/dL Final         Failed - Ca in normal range and within 180 days    Calcium  Date Value Ref Range Status  01/03/2018 9.6 8.6 - 10.2 mg/dL Final   Calcium, Total  Date Value Ref Range Status  11/22/2012 9.2 8.5 - 10.1 mg/dL Final         Passed - Patient is not pregnant      Passed - Last BP in normal range    BP Readings from Last 1 Encounters:  07/25/18 115/81         Passed - Valid encounter within last 6 months    Recent Outpatient Visits          1 week ago PE (physical exam),  annual   [facility] Redge GainerMark A, MD   4 months ago Type 2 diabetes mellitus without complication, without long-term current use of insulin (HCC)   [facility] Cr[facility], Redge GainerMark A, MD   12 months ago Type 2 diabetes mellitus without complication, without long-term current use of insulin (HCC)   [facility] Criss[facility], Redge GainerMark A, MD   1 year ago Essential hypertension   [facility] Criss[facility], Redge GainerMark A, MD   1 year ago Essential hypertension   [facility] Criss[facility], Redge GainerMark A, MD      Future Appointments            In 1 month Cannady, Dorie RankJolene T, NP [facility]

## 2019-08-17 DIAGNOSIS — S61001A Unspecified open wound of right thumb without damage to nail, initial encounter: Secondary | ICD-10-CM | POA: Diagnosis not present

## 2019-08-23 ENCOUNTER — Telehealth: Payer: Self-pay | Admitting: Family Medicine

## 2019-08-23 ENCOUNTER — Other Ambulatory Visit: Payer: BC Managed Care – PPO

## 2019-08-23 DIAGNOSIS — E1142 Type 2 diabetes mellitus with diabetic polyneuropathy: Secondary | ICD-10-CM

## 2019-08-23 NOTE — Telephone Encounter (Signed)
Medication Refill - Medication: pregabalin (LYRICA) 50 MG capsule   Joy from Pharmacy called to report that Pt switched all prescriptions over to new pharmacy [facility])  All were transferable except Generic Lyrica.  Has the patient contacted their pharmacy? Yes.   (Agent: If no, request that the patient contact the pharmacy for the refill.) (Agent: If yes, when and what did the pharmacy advise?)  Preferred Pharmacy (with phone number or street name):  [facility] [address] [place], [place] - [place] [address]  Phone: [phone] Fax: [phone]     Agent: Please be advised that RX refills may take up to 3 business days. We ask that you follow-up with your pharmacy.

## 2019-08-23 NOTE — Telephone Encounter (Signed)
Please send in to new pharmacy. 

## 2019-08-24 ENCOUNTER — Other Ambulatory Visit: Payer: BC Managed Care – PPO

## 2019-08-24 ENCOUNTER — Other Ambulatory Visit: Payer: Self-pay

## 2019-08-24 DIAGNOSIS — E1142 Type 2 diabetes mellitus with diabetic polyneuropathy: Secondary | ICD-10-CM | POA: Diagnosis not present

## 2019-08-24 DIAGNOSIS — Z0000 Encounter for general adult medical examination without abnormal findings: Secondary | ICD-10-CM

## 2019-08-24 LAB — URINALYSIS, ROUTINE W REFLEX MICROSCOPIC
Bilirubin, UA: NEGATIVE
Leukocytes,UA: NEGATIVE
Nitrite, UA: NEGATIVE
Protein,UA: NEGATIVE
RBC, UA: NEGATIVE
Specific Gravity, UA: 1.015 (ref 1.005–1.030)
Urobilinogen, Ur: 0.2 mg/dL (ref 0.2–1.0)
pH, UA: 5.5 (ref 5.0–7.5)

## 2019-08-24 LAB — BAYER DCA HB A1C WAIVED: HB A1C (BAYER DCA - WAIVED): 7.7 % — ABNORMAL HIGH (ref <7.0)

## 2019-08-25 LAB — CBC WITH DIFFERENTIAL/PLATELET
Basophils Absolute: 0.1 10*3/uL (ref 0.0–0.2)
Basos: 1 %
EOS (ABSOLUTE): 0.3 10*3/uL (ref 0.0–0.4)
Eos: 4 %
Hematocrit: 48.5 % (ref 37.5–51.0)
Hemoglobin: 15.4 g/dL (ref 13.0–17.7)
Immature Grans (Abs): 0 10*3/uL (ref 0.0–0.1)
Immature Granulocytes: 0 %
Lymphocytes Absolute: 1.9 10*3/uL (ref 0.7–3.1)
Lymphs: 25 %
MCH: 28.1 pg (ref 26.6–33.0)
MCHC: 31.8 g/dL (ref 31.5–35.7)
MCV: 89 fL (ref 79–97)
Monocytes Absolute: 0.6 10*3/uL (ref 0.1–0.9)
Monocytes: 8 %
Neutrophils Absolute: 4.6 10*3/uL (ref 1.4–7.0)
Neutrophils: 62 %
Platelets: 287 10*3/uL (ref 150–450)
RBC: 5.48 x10E6/uL (ref 4.14–5.80)
RDW: 13.5 % (ref 11.6–15.4)
WBC: 7.6 10*3/uL (ref 3.4–10.8)

## 2019-08-25 LAB — COMPREHENSIVE METABOLIC PANEL
ALT: 21 IU/L (ref 0–44)
AST: 18 IU/L (ref 0–40)
Albumin/Globulin Ratio: 1.3 (ref 1.2–2.2)
Albumin: 4.5 g/dL (ref 3.8–4.8)
Alkaline Phosphatase: 91 IU/L (ref 39–117)
BUN/Creatinine Ratio: 18 (ref 10–24)
BUN: 15 mg/dL (ref 8–27)
Bilirubin Total: 0.6 mg/dL (ref 0.0–1.2)
CO2: 24 mmol/L (ref 20–29)
Calcium: 9.9 mg/dL (ref 8.6–10.2)
Chloride: 101 mmol/L (ref 96–106)
Creatinine, Ser: 0.83 mg/dL (ref 0.76–1.27)
GFR calc Af Amer: 108 mL/min/{1.73_m2} (ref >59)
GFR calc non Af Amer: 94 mL/min/{1.73_m2} (ref >59)
Globulin, Total: 3.5 g/dL (ref 1.5–4.5)
Glucose: 131 mg/dL — ABNORMAL HIGH (ref 65–99)
Potassium: 4.8 mmol/L (ref 3.5–5.2)
Sodium: 137 mmol/L (ref 134–144)
Total Protein: 8 g/dL (ref 6.0–8.5)

## 2019-08-25 LAB — LIPID PANEL
Chol/HDL Ratio: 4.6 ratio (ref 0.0–5.0)
Cholesterol, Total: 190 mg/dL (ref 100–199)
HDL: 41 mg/dL (ref >39)
LDL Chol Calc (NIH): 123 mg/dL — ABNORMAL HIGH (ref 0–99)
Triglycerides: 147 mg/dL (ref 0–149)
VLDL Cholesterol Cal: 26 mg/dL (ref 5–40)

## 2019-08-25 LAB — TSH: TSH: 1.4 u[IU]/mL (ref 0.450–4.500)

## 2019-08-25 LAB — PSA: Prostate Specific Ag, Serum: 1.2 ng/mL (ref 0.0–4.0)

## 2019-08-26 ENCOUNTER — Encounter: Payer: Self-pay | Admitting: Nurse Practitioner

## 2019-08-26 MED ORDER — PREGABALIN 50 MG PO CAPS: 50.0000 mg | ORAL_CAPSULE | Freq: Three times a day (TID) | ORAL | 1 refills | Status: DC

## 2019-08-29 ENCOUNTER — Encounter: Payer: BLUE CROSS/BLUE SHIELD | Admitting: Nurse Practitioner

## 2019-09-07 ENCOUNTER — Telehealth: Payer: Self-pay

## 2019-09-07 NOTE — Telephone Encounter (Signed)
  Mailed  Copied from [place] [phone]. Topic: General - Other >> Sep 07, 2019  3:55 PM Alanda Slim E wrote: Reason for CRM: Pt would like the A1C results mailed instead of him coming to pick them up

## 2019-11-13 DIAGNOSIS — E113393 Type 2 diabetes mellitus with moderate nonproliferative diabetic retinopathy without macular edema, bilateral: Secondary | ICD-10-CM | POA: Diagnosis not present

## 2019-11-13 DIAGNOSIS — H25813 Combined forms of age-related cataract, bilateral: Secondary | ICD-10-CM | POA: Diagnosis not present

## 2019-11-23 DIAGNOSIS — R0600 Dyspnea, unspecified: Secondary | ICD-10-CM

## 2019-11-23 DIAGNOSIS — R200 Anesthesia of skin: Secondary | ICD-10-CM

## 2019-11-23 DIAGNOSIS — Z8616 Personal history of COVID-19: Secondary | ICD-10-CM

## 2019-11-23 DIAGNOSIS — R0609 Other forms of dyspnea: Secondary | ICD-10-CM

## 2019-11-23 DIAGNOSIS — R5383 Other fatigue: Secondary | ICD-10-CM

## 2019-11-23 HISTORY — DX: Personal history of COVID-19: Z86.16

## 2019-11-23 HISTORY — DX: Other fatigue: R53.83

## 2019-11-23 HISTORY — DX: Other forms of dyspnea: R06.09

## 2019-11-23 HISTORY — DX: Anesthesia of skin: R20.0

## 2019-11-23 HISTORY — DX: Dyspnea, unspecified: R06.00

## 2019-12-03 DIAGNOSIS — Z20822 Contact with and (suspected) exposure to covid-19: Secondary | ICD-10-CM | POA: Diagnosis not present

## 2019-12-05 ENCOUNTER — Telehealth: Payer: Self-pay | Admitting: General Practice

## 2019-12-05 NOTE — Telephone Encounter (Signed)
Pt's significant- other who is a Charity fundraiser is calling in for assistance, pt has been Dx with covid. She says that pt is needing a Rx for the below. She says typically has lot of energy, she said that pt doesn't seem to be as energetic.     Zinc and dexamethazone      Pharmacy:  [facility] [place], [place] - [address] Phone:  [phone]  Fax:  [phone]

## 2019-12-05 NOTE — Telephone Encounter (Signed)
Needs appt

## 2019-12-05 NOTE — Telephone Encounter (Signed)
Routing to provider  

## 2019-12-06 ENCOUNTER — Other Ambulatory Visit (HOSPITAL_COMMUNITY): Payer: Self-pay

## 2019-12-06 ENCOUNTER — Other Ambulatory Visit: Payer: Self-pay | Admitting: Nurse Practitioner

## 2019-12-06 ENCOUNTER — Encounter: Payer: Self-pay | Admitting: Family Medicine

## 2019-12-06 ENCOUNTER — Ambulatory Visit (INDEPENDENT_AMBULATORY_CARE_PROVIDER_SITE_OTHER): Payer: BC Managed Care – PPO | Admitting: Family Medicine

## 2019-12-06 ENCOUNTER — Other Ambulatory Visit: Payer: Self-pay

## 2019-12-06 VITALS — BP 138/83 | HR 104 | Temp 98.0°F

## 2019-12-06 DIAGNOSIS — U071 COVID-19: Secondary | ICD-10-CM

## 2019-12-06 DIAGNOSIS — I152 Hypertension secondary to endocrine disorders: Secondary | ICD-10-CM

## 2019-12-06 DIAGNOSIS — E1159 Type 2 diabetes mellitus with other circulatory complications: Secondary | ICD-10-CM

## 2019-12-06 MED ORDER — SUCRALFATE 1 G PO TABS: 1.0000 g | ORAL_TABLET | Freq: Three times a day (TID) | ORAL | 1 refills | Status: DC

## 2019-12-06 MED ORDER — PREDNISONE 10 MG PO TABS: ORAL_TABLET | ORAL | 0 refills | Status: DC

## 2019-12-06 MED ORDER — ONDANSETRON 8 MG PO TBDP: 8.0000 mg | ORAL_TABLET | Freq: Three times a day (TID) | ORAL | 0 refills | Status: DC | PRN

## 2019-12-06 NOTE — Progress Notes (Signed)
  I connected by phone with Casey Hunter. on 12/06/2019 at 3:47 PM to discuss the potential use of an new treatment for mild to moderate COVID-19 viral infection in non-hospitalized patients.  This patient is a 64 y.o. male that meets the FDA criteria for Emergency Use Authorization of bamlanivimab or casirivimab\imdevimab.  Has a (+) direct SARS-CoV-2 viral test result  Has mild or moderate COVID-19   Is ? 65 years of age and weighs ? 40 kg  Is NOT hospitalized due to COVID-19  Is NOT requiring oxygen therapy or requiring an increase in baseline oxygen flow rate due to COVID-19  Is within 10 days of symptom onset  Has at least one of the high risk factor(s) for progression to severe COVID-19 and/or hospitalization as defined in EUA.  Specific high risk criteria : Diabetes   I have spoken and communicated the following to the patient or parent/caregiver:  1. FDA has authorized the emergency use of bamlanivimab and casirivimab\imdevimab for the treatment of mild to moderate COVID-19 in adults and pediatric patients with positive results of direct SARS-CoV-2 viral testing who are 54 years of age and older weighing at least 40 kg, and who are at high risk for progressing to severe COVID-19 and/or hospitalization.  2. The significant known and potential risks and benefits of bamlanivimab and casirivimab\imdevimab, and the extent to which such potential risks and benefits are unknown.  3. Information on available alternative treatments and the risks and benefits of those alternatives, including clinical trials.  4. Patients treated with bamlanivimab and casirivimab\imdevimab should continue to self-isolate and use infection control measures (e.g., wear mask, isolate, social distance, avoid sharing personal items, clean and disinfect "high touch" surfaces, and frequent handwashing) according to CDC guidelines.   5. The patient or parent/caregiver has the option to accept or refuse  bamlanivimab or casirivimab\imdevimab .  After reviewing this information with the patient, The patient agreed to proceed with receiving the bamlanimivab infusion and will be provided a copy of the Fact sheet prior to receiving the infusion.[name] 12/06/2019 3:47 PM

## 2019-12-06 NOTE — Telephone Encounter (Signed)
Scheduled virtual for this afternoon

## 2019-12-06 NOTE — Progress Notes (Signed)
BP 138/83   Pulse (!) 104   Temp 98 F (36.7 C)    Subjective:    Patient ID: Casey Hunter., male    DOB: 13-Dec-1955, 64 y.o.   MRN: [id number]  HPI: [name] is a 64 y.o. male  Chief Complaint  Patient presents with  . covid    pos test 12/03/2019   UPPER RESPIRATORY TRACT INFECTION- tested positive 3 days ago Duration: about a week ago Worst symptom: nausea, vomiting, fever Fever: yes Cough: yes Shortness of breath: no Wheezing: no Chest pain: no Chest tightness: no Chest congestion: no Nasal congestion: no Runny nose: no Post nasal drip: no Sneezing: no Sore throat: no Swollen glands: no Sinus pressure: no Headache: no Face pain: no Toothache: no Ear pain: no  Ear pressure: no  Eyes red/itching:yes Eye drainage/crusting: no  Vomiting: yes Rash: no Fatigue: yes Sick contacts: yes Strep contacts: no  Context: a little bit better Recurrent sinusitis: no Relief with OTC cold/cough medications: no  Treatments attempted: none   Relevant past medical, surgical, family and social history reviewed and updated as indicated. Interim medical history since our last visit reviewed. Allergies and medications reviewed and updated.  Review of Systems  Constitutional: Positive for fatigue.  Respiratory: Positive for cough. Negative for apnea, choking, chest tightness, shortness of breath, wheezing and stridor.   Cardiovascular: Negative.   Gastrointestinal: Positive for nausea and vomiting. Negative for abdominal distention, abdominal pain, anal bleeding, blood in stool, constipation, diarrhea and rectal pain.  Psychiatric/Behavioral: Negative.     Per HPI unless specifically indicated above     Objective:    BP 138/83   Pulse (!) 104   Temp 98 F (36.7 C)   Wt Readings from Last 3 Encounters:  07/25/18 265 lb (120.2 kg)  04/24/18 261 lb 12.8 oz (118.8 kg)  01/03/18 278 lb 8 oz (126.3 kg)    Physical Exam Vitals and nursing note reviewed.    Constitutional:      General: He is not in acute distress.    Appearance: Normal appearance. He is not ill-appearing, toxic-appearing or diaphoretic.  HENT:     Head: Normocephalic and atraumatic.     Right Ear: External ear normal.     Left Ear: External ear normal.     Nose: Nose normal.     Mouth/Throat:     Mouth: Mucous membranes are moist.     Pharynx: Oropharynx is clear.  Eyes:     General: No scleral icterus.       Right eye: No discharge.        Left eye: No discharge.     Conjunctiva/sclera: Conjunctivae normal.     Pupils: Pupils are equal, round, and reactive to light.  Pulmonary:     Effort: Pulmonary effort is normal. No respiratory distress.     Comments: Speaking in full sentences Musculoskeletal:        General: Normal range of motion.     Cervical back: Normal range of motion.  Skin:    Coloration: Skin is not jaundiced or pale.     Findings: No bruising, erythema, lesion or rash.  Neurological:     Mental Status: He is alert and oriented to person, place, and time. Mental status is at baseline.  Psychiatric:        Mood and Affect: Mood normal.        Behavior: Behavior normal.        Thought Content:  Thought content normal.        Judgment: Judgment normal.     Results for orders placed or performed in visit on 08/24/19  Bayer DCA Hb A1c Waived  Result Value Ref Range   HB A1C (BAYER DCA - WAIVED) 7.7 (H) <7.0 %  PSA  Result Value Ref Range   Prostate Specific Ag, Serum 1.2 0.0 - 4.0 ng/mL  Urinalysis, Routine w reflex microscopic  Result Value Ref Range   Specific Gravity, UA 1.015 1.005 - 1.030   pH, UA 5.5 5.0 - 7.5   Color, UA Yellow Yellow   Appearance Ur Clear Clear   Leukocytes,UA Negative Negative   Protein,UA Negative Negative/Trace   Glucose, UA 3+ (A) Negative   Ketones, UA Trace (A) Negative   RBC, UA Negative Negative   Bilirubin, UA Negative Negative   Urobilinogen, Ur 0.2 0.2 - 1.0 mg/dL   Nitrite, UA Negative Negative   TSH  Result Value Ref Range   TSH 1.400 0.450 - 4.500 uIU/mL  CBC with Differential/Platelet  Result Value Ref Range   WBC 7.6 3.4 - 10.8 x10E3/uL   RBC 5.48 4.14 - 5.80 x10E6/uL   Hemoglobin 15.4 13.0 - 17.7 g/dL   Hematocrit 48.5 37.5 - 51.0 %   MCV 89 79 - 97 fL   MCH 28.1 26.6 - 33.0 pg   MCHC 31.8 31.5 - 35.7 g/dL   RDW 13.5 11.6 - 15.4 %   Platelets 287 150 - 450 x10E3/uL   Neutrophils 62 Not Estab. %   Lymphs 25 Not Estab. %   Monocytes 8 Not Estab. %   Eos 4 Not Estab. %   Basos 1 Not Estab. %   Neutrophils Absolute 4.6 1.4 - 7.0 x10E3/uL   Lymphocytes Absolute 1.9 0.7 - 3.1 x10E3/uL   Monocytes Absolute 0.6 0.1 - 0.9 x10E3/uL   EOS (ABSOLUTE) 0.3 0.0 - 0.4 x10E3/uL   Basophils Absolute 0.1 0.0 - 0.2 x10E3/uL   Immature Granulocytes 0 Not Estab. %   Immature Grans (Abs) 0.0 0.0 - 0.1 x10E3/uL  Lipid panel  Result Value Ref Range   Cholesterol, Total 190 100 - 199 mg/dL   Triglycerides 147 0 - 149 mg/dL   HDL 41 >39 mg/dL   VLDL Cholesterol Cal 26 5 - 40 mg/dL   LDL Chol Calc (NIH) 123 (H) 0 - 99 mg/dL   Chol/HDL Ratio 4.6 0.0 - 5.0 ratio  Comprehensive metabolic panel  Result Value Ref Range   Glucose 131 (H) 65 - 99 mg/dL   BUN 15 8 - 27 mg/dL   Creatinine, Ser 0.83 0.76 - 1.27 mg/dL   GFR calc non Af Amer 94 >59 mL/min/1.73   GFR calc Af Amer 108 >59 mL/min/1.73   BUN/Creatinine Ratio 18 10 - 24   Sodium 137 134 - 144 mmol/L   Potassium 4.8 3.5 - 5.2 mmol/L   Chloride 101 96 - 106 mmol/L   CO2 24 20 - 29 mmol/L   Calcium 9.9 8.6 - 10.2 mg/dL   Total Protein 8.0 6.0 - 8.5 g/dL   Albumin 4.5 3.8 - 4.8 g/dL   Globulin, Total 3.5 1.5 - 4.5 g/dL   Albumin/Globulin Ratio 1.3 1.2 - 2.2   Bilirubin Total 0.6 0.0 - 1.2 mg/dL   Alkaline Phosphatase 91 39 - 117 IU/L   AST 18 0 - 40 IU/L   ALT 21 0 - 44 IU/L      Assessment & Plan:   Problem List Items Addressed  This Visit    None    Visit Diagnoses    COVID-19    -  Primary   Mainly gut issues. Will  see if we can get him on the infusion list given risk factors. Will treat gut issues with carafate and zofran. Prednisone for cough.   Relevant Orders   MyChart COVID-19 home monitoring program   Temperature monitoring       Follow up plan: Return if symptoms worsen or fail to improve.   . This visit was completed via Doximity due to the restrictions of the COVID-19 pandemic. All issues as above were discussed and addressed. Physical exam was done as above through visual confirmation on Doximity. If it was felt that the patient should be evaluated in the office, they were directed there. The patient verbally consented to this visit. . Location of the patient: home . Location of the provider: home . Those involved with this call:  . Provider: Olevia Perches, DO . CMA: Tiffany Reel, CMA . Front Desk/Registration: Adela Ports  . Time spent on call: 15 minutes with patient face to face via video conference. More than 50% of this time was spent in counseling and coordination of care. 23 minutes total spent in review of patient's record and preparation of their chart.

## 2019-12-09 ENCOUNTER — Ambulatory Visit (HOSPITAL_COMMUNITY)
Admission: RE | Admit: 2019-12-09 | Discharge: 2019-12-09 | Disposition: A | Discharge status: Home / Self Care | Payer: BC Managed Care – PPO | Source: Ambulatory Visit | Attending: Pulmonary Disease | Admitting: Pulmonary Disease

## 2019-12-09 DIAGNOSIS — U071 COVID-19: Secondary | ICD-10-CM | POA: Insufficient documentation

## 2019-12-09 DIAGNOSIS — Z23 Encounter for immunization: Secondary | ICD-10-CM | POA: Insufficient documentation

## 2019-12-09 DIAGNOSIS — I152 Hypertension secondary to endocrine disorders: Secondary | ICD-10-CM

## 2019-12-09 DIAGNOSIS — I10 Essential (primary) hypertension: Secondary | ICD-10-CM | POA: Insufficient documentation

## 2019-12-09 DIAGNOSIS — E1159 Type 2 diabetes mellitus with other circulatory complications: Secondary | ICD-10-CM | POA: Diagnosis not present

## 2019-12-09 MED ORDER — FAMOTIDINE IN NACL 20-0.9 MG/50ML-% IV SOLN: 20.0000 mg | Freq: Once | INTRAVENOUS | Status: DC | PRN

## 2019-12-09 MED ORDER — EPINEPHRINE 0.3 MG/0.3ML IJ SOAJ: 0.3000 mg | Freq: Once | INTRAMUSCULAR | Status: DC | PRN

## 2019-12-09 MED ORDER — ALBUTEROL SULFATE HFA 108 (90 BASE) MCG/ACT IN AERS: 2.0000 | INHALATION_SPRAY | Freq: Once | RESPIRATORY_TRACT | Status: DC | PRN

## 2019-12-09 MED ORDER — METHYLPREDNISOLONE SODIUM SUCC 125 MG IJ SOLR: 125.0000 mg | Freq: Once | INTRAMUSCULAR | Status: DC | PRN

## 2019-12-09 MED ORDER — SODIUM CHLORIDE 0.9 % IV SOLN
700.0000 mg | Freq: Once | INTRAVENOUS | Status: AC
  Administered 2019-12-09: 700 mg via INTRAVENOUS
  Filled 2019-12-09: qty 20

## 2019-12-09 MED ORDER — SODIUM CHLORIDE 0.9 % IV SOLN
INTRAVENOUS | Status: DC | PRN
  Administered 2019-12-09: 250 mL via INTRAVENOUS

## 2019-12-09 MED ORDER — DIPHENHYDRAMINE HCL 50 MG/ML IJ SOLN: 50.0000 mg | Freq: Once | INTRAMUSCULAR | Status: DC | PRN

## 2019-12-09 NOTE — Discharge Instructions (Signed)

## 2019-12-09 NOTE — Progress Notes (Signed)
  Diagnosis: COVID-19  Physician:  Vonita Moss, MD  Procedure: [facility] Med: bamlanivimab infusion - Provided patient with bamlanimivab fact sheet for patients, parents and caregivers prior to infusion.  Complications: No immediate complications noted.  Discharge: Discharged home   Casey Hunter R [name] 12/09/2019

## 2019-12-11 ENCOUNTER — Telehealth: Payer: Self-pay

## 2019-12-11 ENCOUNTER — Encounter: Payer: Self-pay | Admitting: Family Medicine

## 2019-12-11 NOTE — Telephone Encounter (Signed)
Patient notified. He states that Obrien Huskins will be picking up the letter for him. He states that Lelon Mast has not been around him at all.

## 2019-12-11 NOTE — Telephone Encounter (Signed)
Copied from CRM [id number]. Topic: General - Other >> Dec 10, 2019  4:38 PM Daphine Deutscher D wrote: Reason for CRM: pt called saying he tested positive for Covid on this past Wednesday and had a virtual appt Thursday with Dr. Laural Benes.  His employer wants him to get a note.  They don't think he needs to come back to work this week nor next week.   He had an infusion yesterday at the [facility]  CB#  [phone]   Routing to provider.

## 2019-12-11 NOTE — Telephone Encounter (Signed)
Note sent to his mychart. If he needs it mailed/faxed, OK to do that

## 2019-12-14 ENCOUNTER — Encounter: Payer: Self-pay | Admitting: Family Medicine

## 2019-12-14 ENCOUNTER — Ambulatory Visit (INDEPENDENT_AMBULATORY_CARE_PROVIDER_SITE_OTHER): Payer: BC Managed Care – PPO | Admitting: Family Medicine

## 2019-12-14 ENCOUNTER — Telehealth: Payer: Self-pay | Admitting: Family Medicine

## 2019-12-14 VITALS — BP 137/73 | HR 93 | Temp 96.0°F

## 2019-12-14 DIAGNOSIS — U071 COVID-19: Secondary | ICD-10-CM | POA: Diagnosis not present

## 2019-12-14 NOTE — Telephone Encounter (Signed)
Pt states that he is still confused and lethargic, leg pain after COVID diagnosis.  Pt wants to know if this is normal and how long this will last.

## 2019-12-14 NOTE — Telephone Encounter (Signed)
Tried calling patient, no answer.  LVM for patient to return phone call. 

## 2019-12-14 NOTE — Telephone Encounter (Signed)
Patient scheduled for telephone visit with Dr. Laural Benes at 1:45

## 2019-12-14 NOTE — Telephone Encounter (Signed)
Yes. This is is normal. The Fatigue can last up to a couple of months after. If he is feeling worse and would like to be seen, we can 100% do that today and talk further about his symptoms (OK to double book me at 1:45 or 3:15)

## 2019-12-14 NOTE — Telephone Encounter (Signed)
Routing to provider to advise.  

## 2019-12-14 NOTE — Progress Notes (Signed)
BP 137/73   Pulse 93   Temp (!) 96 F (35.6 C)   SpO2 93%    Subjective:    Patient ID: Casey Greenspan., male    DOB: 04-Dec-1955, 64 y.o.   MRN: [id number]  HPI: Casey Cockrell. is a 64 y.o. male  Chief Complaint  Patient presents with  . COVID Positive   Has gotten the infusions, he doesn't feel like they particularly helped. Feeling very tired and has been feeling dizzy. He notes that he is still very fatigued. Feels like his mind is playing tricks on him. Feels a bit confused. Has been still working on the farm. Gut is better. He's running about 92-94% on his oxygen. HR 90s-100s  COVID-19 Worst symptom: fatigue Fever: no, but cold sweats a night Cough: yes Shortness of breath: no Wheezing: no Chest pain: no Chest tightness: no Chest congestion: no Nasal congestion: no Runny nose: no Post nasal drip: no Sneezing: no Sore throat: no Swollen glands: no Sinus pressure: no Headache: yes Face pain: no Toothache: no Ear pain: no  Ear pressure: no  Eyes red/itching:no Eye drainage/crusting: no  Vomiting: no Rash: no Fatigue: yes Sick contacts: no Strep contacts: no  Context: stable Recurrent sinusitis: no Relief with OTC cold/cough medications: no  Treatments attempted: prednisone, infusions   Relevant past medical, surgical, family and social history reviewed and updated as indicated. Interim medical history since our last visit reviewed. Allergies and medications reviewed and updated.  Review of Systems  Constitutional: Positive for appetite change, chills, diaphoresis and fatigue. Negative for activity change, fever and unexpected weight change.  HENT: Negative.   Respiratory: Positive for cough. Negative for apnea, choking, chest tightness, shortness of breath, wheezing and stridor.   Cardiovascular: Negative.   Gastrointestinal: Negative.   Musculoskeletal: Positive for gait problem and myalgias. Negative for arthralgias, back pain, joint swelling,  neck pain and neck stiffness.  Skin: Negative.   Psychiatric/Behavioral: Negative.     Per HPI unless specifically indicated above     Objective:    BP 137/73   Pulse 93   Temp (!) 96 F (35.6 C)   SpO2 93%   Wt Readings from Last 3 Encounters:  07/25/18 265 lb (120.2 kg)  04/24/18 261 lb 12.8 oz (118.8 kg)  01/03/18 278 lb 8 oz (126.3 kg)    Physical Exam Vitals and nursing note reviewed.  Constitutional:      General: He is not in acute distress.    Appearance: Normal appearance. He is not ill-appearing, toxic-appearing or diaphoretic.  HENT:     Head: Normocephalic and atraumatic.     Right Ear: External ear normal.     Left Ear: External ear normal.     Nose: Nose normal.     Mouth/Throat:     Mouth: Mucous membranes are moist.     Pharynx: Oropharynx is clear.  Eyes:     General: No scleral icterus.       Right eye: No discharge.        Left eye: No discharge.     Conjunctiva/sclera: Conjunctivae normal.     Pupils: Pupils are equal, round, and reactive to light.  Pulmonary:     Effort: Pulmonary effort is normal. No respiratory distress.     Comments: Speaking in full sentences Musculoskeletal:        General: Normal range of motion.     Cervical back: Normal range of motion.  Skin:    Coloration:  Skin is not jaundiced or pale.     Findings: No bruising, erythema, lesion or rash.  Neurological:     Mental Status: He is alert and oriented to person, place, and time. Mental status is at baseline.  Psychiatric:        Mood and Affect: Mood normal.        Behavior: Behavior normal.        Thought Content: Thought content normal.        Judgment: Judgment normal.     Results for orders placed or performed in visit on 08/24/19  Bayer DCA Hb A1c Waived  Result Value Ref Range   HB A1C (BAYER DCA - WAIVED) 7.7 (H) <7.0 %  PSA  Result Value Ref Range   Prostate Specific Ag, Serum 1.2 0.0 - 4.0 ng/mL  Urinalysis, Routine w reflex microscopic  Result Value  Ref Range   Specific Gravity, UA 1.015 1.005 - 1.030   pH, UA 5.5 5.0 - 7.5   Color, UA Yellow Yellow   Appearance Ur Clear Clear   Leukocytes,UA Negative Negative   Protein,UA Negative Negative/Trace   Glucose, UA 3+ (A) Negative   Ketones, UA Trace (A) Negative   RBC, UA Negative Negative   Bilirubin, UA Negative Negative   Urobilinogen, Ur 0.2 0.2 - 1.0 mg/dL   Nitrite, UA Negative Negative  TSH  Result Value Ref Range   TSH 1.400 0.450 - 4.500 uIU/mL  CBC with Differential/Platelet  Result Value Ref Range   WBC 7.6 3.4 - 10.8 x10E3/uL   RBC 5.48 4.14 - 5.80 x10E6/uL   Hemoglobin 15.4 13.0 - 17.7 g/dL   Hematocrit 48.5 37.5 - 51.0 %   MCV 89 79 - 97 fL   MCH 28.1 26.6 - 33.0 pg   MCHC 31.8 31.5 - 35.7 g/dL   RDW 13.5 11.6 - 15.4 %   Platelets 287 150 - 450 x10E3/uL   Neutrophils 62 Not Estab. %   Lymphs 25 Not Estab. %   Monocytes 8 Not Estab. %   Eos 4 Not Estab. %   Basos 1 Not Estab. %   Neutrophils Absolute 4.6 1.4 - 7.0 x10E3/uL   Lymphocytes Absolute 1.9 0.7 - 3.1 x10E3/uL   Monocytes Absolute 0.6 0.1 - 0.9 x10E3/uL   EOS (ABSOLUTE) 0.3 0.0 - 0.4 x10E3/uL   Basophils Absolute 0.1 0.0 - 0.2 x10E3/uL   Immature Granulocytes 0 Not Estab. %   Immature Grans (Abs) 0.0 0.0 - 0.1 x10E3/uL  Lipid panel  Result Value Ref Range   Cholesterol, Total 190 100 - 199 mg/dL   Triglycerides 147 0 - 149 mg/dL   HDL 41 >39 mg/dL   VLDL Cholesterol Cal 26 5 - 40 mg/dL   LDL Chol Calc (NIH) 123 (H) 0 - 99 mg/dL   Chol/HDL Ratio 4.6 0.0 - 5.0 ratio  Comprehensive metabolic panel  Result Value Ref Range   Glucose 131 (H) 65 - 99 mg/dL   BUN 15 8 - 27 mg/dL   Creatinine, Ser 0.83 0.76 - 1.27 mg/dL   GFR calc non Af Amer 94 >59 mL/min/1.73   GFR calc Af Amer 108 >59 mL/min/1.73   BUN/Creatinine Ratio 18 10 - 24   Sodium 137 134 - 144 mmol/L   Potassium 4.8 3.5 - 5.2 mmol/L   Chloride 101 96 - 106 mmol/L   CO2 24 20 - 29 mmol/L   Calcium 9.9 8.6 - 10.2 mg/dL   Total Protein  8.0 6.0 - 8.5  g/dL   Albumin 4.5 3.8 - 4.8 g/dL   Globulin, Total 3.5 1.5 - 4.5 g/dL   Albumin/Globulin Ratio 1.3 1.2 - 2.2   Bilirubin Total 0.6 0.0 - 1.2 mg/dL   Alkaline Phosphatase 91 39 - 117 IU/L   AST 18 0 - 40 IU/L   ALT 21 0 - 44 IU/L      Assessment & Plan:   Problem List Items Addressed This Visit    None    Visit Diagnoses    COVID-19    -  Primary   Advised rest- not pushing it too far. Seems to be improving. Continue to monitor. Call if not getting better or getting worse.        Follow up plan: Return in about 1 week (around 12/21/2019).   . This visit was completed via Doximity due to the restrictions of the COVID-19 pandemic. All issues as above were discussed and addressed. Physical exam was done as above through visual confirmation on Doximity. If it was felt that the patient should be evaluated in the office, they were directed there. The patient verbally consented to this visit. . Location of the patient: home . Location of the provider: work . Those involved with this call:  . Provider: Olevia Perches, DO . CMA: Tiffany Reel, CMA . Front Desk/Registration: Adela Ports  . Time spent on call: 15 minutes with patient face to face via video conference. More than 50% of this time was spent in counseling and coordination of care. 23 minutes total spent in review of patient's record and preparation of their chart.

## 2019-12-14 NOTE — Telephone Encounter (Signed)
Please overbook patient for 1:45PM.

## 2019-12-17 ENCOUNTER — Telehealth: Payer: Self-pay

## 2019-12-17 NOTE — Telephone Encounter (Signed)
[employer] disability paperwork was faxed, will wait for fax, then we can get it filled out.  Copied from CRM [id number]. Topic: General - Inquiry >> Dec 17, 2019 11:00 AM Casey Hunter, NT wrote: Reason for CRM: Pt called in stating he has sent disability paperwork to office via fax. States he would like it filled out if possible. Pt states he is still feeling weak and would like PCP updated. Please advise.

## 2019-12-18 ENCOUNTER — Encounter: Payer: Self-pay | Admitting: Internal Medicine

## 2019-12-18 ENCOUNTER — Ambulatory Visit
Admission: RE | Admit: 2019-12-18 | Discharge: 2019-12-18 | Disposition: A | Discharge status: Home / Self Care | Payer: BC Managed Care – PPO | Source: Ambulatory Visit | Attending: Internal Medicine | Admitting: Internal Medicine

## 2019-12-18 ENCOUNTER — Ambulatory Visit (INDEPENDENT_AMBULATORY_CARE_PROVIDER_SITE_OTHER): Payer: BC Managed Care – PPO | Admitting: Internal Medicine

## 2019-12-18 ENCOUNTER — Ambulatory Visit: Admission: RE | Admit: 2019-12-18 | Payer: BC Managed Care – PPO | Source: Ambulatory Visit | Admitting: *Deleted

## 2019-12-18 ENCOUNTER — Ambulatory Visit: Payer: Self-pay | Admitting: *Deleted

## 2019-12-18 ENCOUNTER — Inpatient Hospital Stay: Admission: RE | Admit: 2019-12-18 | Payer: BC Managed Care – PPO | Source: Ambulatory Visit | Admitting: *Deleted

## 2019-12-18 VITALS — BP 126/87 | HR 101 | Temp 98.4°F | Wt 266.4 lb

## 2019-12-18 DIAGNOSIS — E1159 Type 2 diabetes mellitus with other circulatory complications: Secondary | ICD-10-CM

## 2019-12-18 DIAGNOSIS — I152 Hypertension secondary to endocrine disorders: Secondary | ICD-10-CM

## 2019-12-18 DIAGNOSIS — R0602 Shortness of breath: Secondary | ICD-10-CM

## 2019-12-18 DIAGNOSIS — R5383 Other fatigue: Secondary | ICD-10-CM | POA: Diagnosis not present

## 2019-12-18 DIAGNOSIS — R000 Tachycardia, unspecified: Secondary | ICD-10-CM

## 2019-12-18 DIAGNOSIS — R531 Weakness: Secondary | ICD-10-CM | POA: Diagnosis not present

## 2019-12-18 DIAGNOSIS — I10 Essential (primary) hypertension: Secondary | ICD-10-CM

## 2019-12-18 DIAGNOSIS — R509 Fever, unspecified: Secondary | ICD-10-CM | POA: Diagnosis not present

## 2019-12-18 LAB — POCT URINALYSIS DIPSTICK
Bilirubin, UA: NEGATIVE
Blood, UA: NEGATIVE
Glucose, UA: POSITIVE — AB
Leukocytes, UA: NEGATIVE
Nitrite, UA: NEGATIVE
Protein, UA: NEGATIVE
Spec Grav, UA: 1.015 (ref 1.010–1.025)
Urobilinogen, UA: 0.2 E.U./dL
pH, UA: 5 (ref 5.0–8.0)

## 2019-12-18 MED ORDER — ALBUTEROL SULFATE HFA 108 (90 BASE) MCG/ACT IN AERS: 2.0000 | INHALATION_SPRAY | Freq: Four times a day (QID) | RESPIRATORY_TRACT | 0 refills | Status: DC | PRN

## 2019-12-18 MED ORDER — ALBUTEROL SULFATE HFA 108 (90 BASE) MCG/ACT IN AERS: 2.0000 | INHALATION_SPRAY | Freq: Four times a day (QID) | RESPIRATORY_TRACT | 8 refills | Status: DC | PRN

## 2019-12-18 NOTE — Progress Notes (Signed)
This visit occurred during the SARS-CoV-2 public health emergency.  Safety protocols were in place, including screening questions prior to the visit, additional usage of staff PPE, and extensive cleaning of exam room while observing appropriate contact time as indicated for disinfecting solutions.  Subjective:     Patient ID: Casey Hunter , male    DOB: January 02, 1956 , 64 y.o.   MRN: [id number]   Chief Complaint  Patient presents with  . Fatigue  . Numbness    kness down, bilateral.   . Pain    between chest and abdomen  . Dizziness    HPI  Had + covid test 1/13. His illness started 1/9 with only fatigue, then the next 2 days developed N/V, eyes burning, dizzy, tongue felt funny and swelling. Her PCP called in Prednisone and frozen. On 1/16 he went to get an infusion and could not tell this helped him.  His glucose have improved and been around 150's since off the prednisone. Has not had a fever in the past week, but for the past 2 nights been waking up with sweats and chills. Denies having much of a cough with this illness.  This am felt upper abd pressure, but as the day went along felt  Pulse ox at home has been 91-94% and at one time went down to 89%. He does not feel he has been feeling SOB but his son did not think he was breathing. 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   Past Medical History:  Diagnosis Date  . Hyperlipidemia   . Hypertension   . Sleep apnea      Family History  Problem Relation Age of Onset  . Hypertension Mother   . Hyperlipidemia Mother   . Hyperlipidemia Father      Current Outpatient Medications:  .  amLODipine (NORVASC) 10 MG tablet, TAKE 1 TABLET DAILY., Disp: 30 tablet, Rfl: 5 .  aspirin 81 MG tablet, Take 81 mg by mouth daily., Disp: , Rfl:  .  colesevelam (WELCHOL) 625 MG tablet, Take 3 tablets (1,875 mg total) by mouth 2 (two) times daily with a meal. Takes 3 tables twice daily, Disp: 540 tablet,  Rfl: 4 .  Continuous Blood Gluc Receiver (FREESTYLE LIBRE 14 DAY READER) DEVI, USE AS DIRECTED, Disp: 1 Device, Rfl: 12 .  Continuous Blood Gluc Sensor (FREESTYLE LIBRE 14 DAY SENSOR) MISC, Inject 1 each into the skin See admin instructions., Disp: 2 each, Rfl: 12 .  dapagliflozin propanediol (FARXIGA) 10 MG TABS tablet, Take 10 mg by mouth daily., Disp: 90 tablet, Rfl: 4 .  Dulaglutide (TRULICITY) 1.5 MG/0.5ML SOPN, Inject 1.5 mg into the skin once a week., Disp: 12 pen, Rfl: 4 .  ezetimibe (ZETIA) 10 MG tablet, Take 1 tablet (10 mg total) by mouth daily., Disp: 90 tablet, Rfl: 4 .  glucose blood test strip, 1 each by Other route as needed for other. Use as instructed, Disp: , Rfl:  .  lovastatin (MEVACOR) 20 MG tablet, Take 1 tablet (20 mg total) by mouth at bedtime., Disp: 90 tablet, Rfl: 4 .  meloxicam (MOBIC) 15 MG tablet, Take 1 tablet (15 mg total) by mouth daily., Disp: 90 tablet, Rfl: 4 .  metFORMIN (GLUCOPHAGE) 1000 MG tablet, Take 1 tablet (1,000 mg total) by mouth 2 (two) times daily with a meal., Disp: 180 tablet, Rfl: 4 .  niacin (NIASPAN) 1000 MG CR tablet, Take 1 tablet (1,000 mg total)  by mouth at bedtime., Disp: 90 tablet, Rfl: 4 .  Olmesartan-amLODIPine-HCTZ 40-10-25 MG TABS, Take 1 tablet by mouth daily., Disp: 90 tablet, Rfl: 4 .  olmesartan-hydrochlorothiazide (BENICAR HCT) 40-25 MG tablet, TAKE 1 TABLET DAILY., Disp: 30 tablet, Rfl: 5 .  ondansetron (ZOFRAN ODT) 8 MG disintegrating tablet, Take 1 tablet (8 mg total) by mouth every 8 (eight) hours as needed for nausea or vomiting., Disp: 20 tablet, Rfl: 0 .  pregabalin (LYRICA) 50 MG capsule, Take 1 capsule (50 mg total) by mouth 3 (three) times daily., Disp: 270 capsule, Rfl: 1 .  sildenafil (REVATIO) 20 MG tablet, Take 1 tablet (20 mg total) by mouth as needed. 1-5 prn, Disp: 50 tablet, Rfl: 12 .  sitaGLIPtin (JANUVIA) 100 MG tablet, Take 1 tablet (100 mg total) by mouth daily., Disp: 90 tablet, Rfl: 4 .  sucralfate  (CARAFATE) 1 g tablet, Take 1 tablet (1 g total) by mouth 4 (four) times daily -  with meals and at bedtime., Disp: 120 tablet, Rfl: 1   Allergies  Allergen Reactions  . Crestor [Rosuvastatin Calcium]     Myalgias  . Lipitor [Atorvastatin]     Myalgia  . Penicillin G Potassium [Penicillin G]   . Sulfur      Review of Systems  + nausea, poor appetite, noticed poor memory, foggy headed initially and better now.  + lower leg weakness this week and denies color changes of toes and skin.  Denies vomiting or diarrhea, ear pain, sore throat, rhinitis, post nasal drainage.  Denies blood or black stool or substernal chest pressure or pain. Denies edema of legs.  Today's Vitals   12/18/19 1829  BP: 126/87  Pulse: (!) 101  Temp: 98.4 F (36.9 C)  TempSrc: Oral  SpO2: 96%  Weight: 266 lb 6.4 oz (120.8 kg)   Body mass index is 35.15 kg/m.   Objective:  Physical Exam   Constitutional: She is oriented to person, place, and time. She appears well-developed and well-nourished. No distress.  HENT:  Head: Normocephalic and atraumatic.  Right Ear: External ear normal.  Left Ear: External ear normal.  Nose: Nose normal.  Eyes: Conjunctivae are normal. Right eye exhibits no discharge. Left eye exhibits no discharge. No scleral icterus.  Neck: Neck supple. No thyromegaly present.  No carotid bruits bilaterally  Cardiovascular: Normal rate and regular rhythm.  No murmur heard. Pulmonary/Chest: Effort normal and breath sounds normal. No respiratory distress.  ABD- + soft, + BS, no masses at tenderness Musculoskeletal: Normal range of motion. She exhibits no edema.  Lymphadenopathy:    he has no cervical adenopathy.  Neurological: he is alert and oriented to person, place, and time.  Skin: Skin is warm and dry. Capillary refill takes less than 2 seconds. No rash noted. he is not diaphoretic.  Psychiatric: he has a normal mood and affect. Her behavior is normal. Judgment and thought content  normal.  Nursing note reviewed.     Assessment And Plan:     1. Weakness- post covid. reapplied this is expected and could last for a moth. Needs to have his PCP review his labs tomorrow since we are closed tomorrow.  - DG Chest 2 View; Future - CBC With Differential - COMPLETE METABOLIC PANEL WITH GFR  2. Other fatigue- post covid. As noted in # 1 - CBC With Differential - COMPLETE METABOLIC PANEL WITH GFR  3. SOB (shortness of breath)-episodic and more likely secondary to pneumonia.  Placed on albuterol inhlaer- see directions.  4. Tachycardia- possibly due to mild dehydration. Asked to push more fluids and we will have him  Have home monitoring 5- Covid pneumonia- I placed him on Ventolin inhaler to use q 4h x 5-7 days.    Needs to continue monitoring his pulse Ox He is already taking Vit C and D, plus I asked him to add Quercitin 500 mg three times a day for 7 days which helps absorb Zinc into the cells to decrease the viral load. He was explained that this helps best when symptoms are first starting. Needs to stay active and not stay in bed to long to prevent DVT's FU with PCP in 2-3 days.    Casey Saxer RODRIGUEZ-SOUTHWORTH, PA-C    THE PATIENT IS ENCOURAGED TO PRACTICE SOCIAL DISTANCING DUE TO THE COVID-19 PANDEMIC.

## 2019-12-18 NOTE — Telephone Encounter (Signed)
Paperwork received and put in Dr. Henriette Combs folder to finish completing.

## 2019-12-18 NOTE — Telephone Encounter (Signed)
Patient was diagnosed  With COVID 2 weeks ago- he is not feeling good and his chest is bothering him. Patient feels worse today- he has been more active to try to get energy.Patient is stating he has chest discomfort that is coming and going- lasting 5-10 minutes. He does have some pressure when he takes a deep breath.    Reason for Disposition . [1] Chest pain (or "angina") comes and goes AND [2] is happening more often (increasing in frequency) or getting worse (increasing in severity)  Answer Assessment - Initial Assessment Questions 1. LOCATION: "Where does it hurt?"       Generally in the chest- all around 2. RADIATION: "Does the pain go anywhere else?" (e.g., into neck, jaw, arms, back)     no 3. ONSET: "When did the chest pain begin?" (Minutes, hours or days)      Pressure started this morning 4. PATTERN "Does the pain come and go, or has it been constant since it started?"  "Does it get worse with exertion?"      Comes and goes 5. DURATION: "How long does it last" (e.g., seconds, minutes, hours)     Lasting minutes- 5-10 6. SEVERITY: "How bad is the pain?"  (e.g., Scale 1-10; mild, moderate, or severe)    - MILD (1-3): doesn't interfere with normal activities     - MODERATE (4-7): interferes with normal activities or awakens from sleep    - SEVERE (8-10): excruciating pain, unable to do any normal activities       mild 7. CARDIAC RISK FACTORS: "Do you have any history of heart problems or risk factors for heart disease?" (e.g., angina, prior heart attack; diabetes, high blood pressure, high cholesterol, smoker, or strong family history of heart disease)     Diabetes, high blood pressure 8. PULMONARY RISK FACTORS: "Do you have any history of lung disease?"  (e.g., blood clots in lung, asthma, emphysema, birth control pills)     no 9. CAUSE: "What do you think is causing the chest pain?"     COVID 10. OTHER SYMPTOMS: "Do you have any other symptoms?" (e.g., dizziness, nausea,  vomiting, sweating, fever, difficulty breathing, cough)       Feels pressure with deep breath, fatigue, cough, dizziness at times 11. PREGNANCY: "Is there any chance you are pregnant?" "When was your last menstrual period?"       n/a  Protocols used: CHEST PAIN-A-AH

## 2019-12-18 NOTE — Patient Instructions (Addendum)
You have Covid Pneumonia which is viral and your body will fight this off.   You may also take Zinc 50 mg daily and Quercitin 500 mg three times a day for 7 days and continue Vit C 1000 mg  And Vit D 2,000- 5,000 a day.   Call your family Dr to go over your blood work since we are closed tomorrow.  Take off the rest of the week, and move around every hour for 15- 20 minutes and gradually increase your time to exercise your lungs.  I am going to have you try an inhaler and want you to use it every 4 hours, but specially before activity to h Pneumonitis  Pneumonitis is inflammation of the lungs. Infection or exposure to certain substances or allergens can cause this condition. Allergens are substances that you are allergic to. What are the causes? This condition may be caused by:  An infection from bacteria or a virus (pneumonia).  Exposure to certain substances in the workplace. This includes working on farms and in certain industries. Some substances that can cause this condition include asbestos, silica, inhaled acids, or inhaled chlorine gas.  Repeated exposure to bird feathers, bird feces, or other allergens.  Medicines such as chemotherapy drugs, certain antibiotic medicines, and some heart medicines.  Radiation therapy.  Exposure to mold. A hot tub, sauna, or home humidifier can have mold growing in it, even if it looks clean. You can breathe in the mold through water vapor.  Breathing in (aspirating) stomach contents, food, or liquids into the lungs. What are the signs or symptoms? Symptoms of this condition include:  Shortness of breath or trouble breathing. This is the most common symptom.  Cough.  Fever.  Decreased energy.  Decreased appetite. How is this diagnosed? This condition may be diagnosed based on:  Your medical history.  Physical exam.  Blood tests.  Other tests, including: ? Pulmonary function test (PFT). This measures how well your lungs  work. ? Chest X-ray. ? CT scan of the lungs. ? Bronchoscopy. In this procedure, your health care provider looks at your airways through an instrument called a bronchoscope. ? Lung biopsy. In this procedure, your health care provider takes a small piece of tissue from your lungs to examine it. How is this treated? Treatment depends on the cause of the condition. If the cause is exposure to a substance, avoiding further exposure to that substance will help reduce your symptoms. Possible medical treatments for pneumonitis include:  Corticosteroid medicine to help decrease inflammation.  Antibiotic medicine to help fight an infection caused by bacteria.  Bronchodilators or inhalers to help relax the muscles and make breathing easier.  Oxygen therapy, if you are having trouble breathing. Follow these instructions at home:  Take or use over-the-counter and prescription medicines only as told by your health care provider. This includes any inhaler use.  Avoid exposure to any substance that caused your pneumonitis. If you need to work with substances that can cause pneumonitis, wear a mask to protect your lungs.  If you were prescribed an antibiotic, take it as told by your health care provider. Do not stop taking the antibiotic even if you start to feel better.  If you were prescribed an inhaler, keep it with you at all times.  Do not use any products that contain nicotine or tobacco, such as cigarettes and e-cigarettes. If you need help quitting, ask your health care provider.  Keep all follow-up visits as told by your health care  provider. This is important. Contact a health care provider if:  You have a fever.  Your symptoms get worse. Get help right away if:  You have new or worse shortness of breath.  You develop a blue color (cyanosis) under your fingernails. Summary  Pneumonitis is inflammation of the lungs. This condition can be caused by infection or exposure to certain  substances or allergens.  The most common symptom of this condition is shortness of breath or trouble breathing.  Treatment depends on the cause of your condition. This information is not intended to replace advice given to you by your health care provider. Make sure you discuss any questions you have with your health care provider. Document Revised: 10/21/2017 Document Reviewed: 09/30/2016 Elsevier Patient Education  2020 [employer] elp you breath better.

## 2019-12-18 NOTE — Telephone Encounter (Signed)
Contacted patient. He will report to the clinic tonight at 6:05 p.m.

## 2019-12-18 NOTE — Telephone Encounter (Signed)
Can we get him seen at the respiratory clinic tonight or Thursday?

## 2019-12-19 LAB — CBC WITH DIFFERENTIAL
Basophils Absolute: 0.1 10*3/uL (ref 0.0–0.2)
Basos: 1 %
EOS (ABSOLUTE): 0.2 10*3/uL (ref 0.0–0.4)
Eos: 2 %
Hematocrit: 46.5 % (ref 37.5–51.0)
Hemoglobin: 15.6 g/dL (ref 13.0–17.7)
Immature Grans (Abs): 0.1 10*3/uL (ref 0.0–0.1)
Immature Granulocytes: 1 %
Lymphocytes Absolute: 3.1 10*3/uL (ref 0.7–3.1)
Lymphs: 30 %
MCH: 28.4 pg (ref 26.6–33.0)
MCHC: 33.5 g/dL (ref 31.5–35.7)
MCV: 85 fL (ref 79–97)
Monocytes Absolute: 1.1 10*3/uL — ABNORMAL HIGH (ref 0.1–0.9)
Monocytes: 10 %
Neutrophils Absolute: 5.8 10*3/uL (ref 1.4–7.0)
Neutrophils: 56 %
RBC: 5.5 x10E6/uL (ref 4.14–5.80)
RDW: 13.4 % (ref 11.6–15.4)
WBC: 10.3 10*3/uL (ref 3.4–10.8)

## 2019-12-20 ENCOUNTER — Ambulatory Visit (INDEPENDENT_AMBULATORY_CARE_PROVIDER_SITE_OTHER): Payer: BC Managed Care – PPO | Admitting: Family Medicine

## 2019-12-20 ENCOUNTER — Encounter: Payer: Self-pay | Admitting: Family Medicine

## 2019-12-20 VITALS — BP 137/82 | HR 102 | Temp 98.0°F

## 2019-12-20 DIAGNOSIS — J1282 Pneumonia due to coronavirus disease 2019: Secondary | ICD-10-CM | POA: Diagnosis not present

## 2019-12-20 DIAGNOSIS — U071 COVID-19: Secondary | ICD-10-CM

## 2019-12-20 MED ORDER — DOXYCYCLINE HYCLATE 100 MG PO TABS: 100.0000 mg | ORAL_TABLET | Freq: Two times a day (BID) | ORAL | 0 refills | Status: DC

## 2019-12-20 NOTE — Progress Notes (Signed)
BP 137/82   Pulse (!) 102   Temp 98 F (36.7 C)   SpO2 (!) 89%    Subjective:    Patient ID: Page Spiro., male    DOB: 06-17-1956, 64 y.o.   MRN: [id number]  HPI: Casey Hunter. is a 64 y.o. male  Chief Complaint  Patient presents with  . COVID Follow up    follow up from respiratory clinic, return to work, [employer] paperwork    Saw respiratory clinic. Had labs which were normal and x-ray which showed covid pneumonia. They started him on an inhaler. He has been using an inhaler, but he's unsure if he's using it right. He notes that he is starting to feel a little better. He is still feeling really tired. 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 He is otherwise doing OK with no other concerns or complaints at this time.   Relevant past medical, surgical, family and social history reviewed and updated as indicated. Interim medical history since our last visit reviewed. Allergies and medications reviewed and updated.  Review of Systems  Constitutional: Positive for fatigue. Negative for activity change, appetite change, chills, diaphoresis, fever and unexpected weight change.  HENT: Negative.   Respiratory: Negative.   Cardiovascular: Negative.   Musculoskeletal: Negative.   Psychiatric/Behavioral: Negative.    Per HPI unless specifically indicated above     Objective:    BP 137/82   Pulse (!) 102   Temp 98 F (36.7 C)   SpO2 (!) 89%   Wt Readings from Last 3 Encounters:  12/18/19 266 lb 6.4 oz (120.8 kg)  07/25/18 265 lb (120.2 kg)  04/24/18 261 lb 12.8 oz (118.8 kg)    Physical Exam Vitals and nursing note reviewed.  Constitutional:      General: He is not in acute distress.    Appearance: Normal appearance. He is not ill-appearing, toxic-appearing or diaphoretic.  HENT:     Head: Normocephalic and atraumatic.     Right Ear: External ear normal.     Left Ear: External ear normal.     Nose: Nose  normal.     Mouth/Throat:     Mouth: Mucous membranes are moist.     Pharynx: Oropharynx is clear.  Eyes:     General: No scleral icterus.       Right eye: No discharge.        Left eye: No discharge.     Conjunctiva/sclera: Conjunctivae normal.     Pupils: Pupils are equal, round, and reactive to light.  Pulmonary:     Effort: Pulmonary effort is normal. No respiratory distress.     Comments: Speaking in full sentences Musculoskeletal:        General: Normal range of motion.     Cervical back: Normal range of motion.  Skin:    Coloration: Skin is not jaundiced or pale.     Findings: No bruising, erythema, lesion or rash.  Neurological:     Mental Status: He is alert and oriented to person, place, and time. Mental status is at baseline.  Psychiatric:        Mood and Affect: Mood normal.        Behavior: Behavior normal.        Thought Content: Thought content normal.        Judgment: Judgment normal.     Results for orders placed or performed  in visit on 12/18/19  CBC With Differential  Result Value Ref Range   WBC 10.3 3.4 - 10.8 x10E3/uL   RBC 5.50 4.14 - 5.80 x10E6/uL   Hemoglobin 15.6 13.0 - 17.7 g/dL   Hematocrit 53.6 64.4 - 51.0 %   MCV 85 79 - 97 fL   MCH 28.4 26.6 - 33.0 pg   MCHC 33.5 31.5 - 35.7 g/dL   RDW 03.4 74.2 - 59.5 %   Neutrophils 56 Not Estab. %   Lymphs 30 Not Estab. %   Monocytes 10 Not Estab. %   Eos 2 Not Estab. %   Basos 1 Not Estab. %   Neutrophils Absolute 5.8 1.4 - 7.0 x10E3/uL   Lymphocytes Absolute 3.1 0.7 - 3.1 x10E3/uL   Monocytes Absolute 1.1 (H) 0.1 - 0.9 x10E3/uL   EOS (ABSOLUTE) 0.2 0.0 - 0.4 x10E3/uL   Basophils Absolute 0.1 0.0 - 0.2 x10E3/uL   Immature Granulocytes 1 Not Estab. %   Immature Grans (Abs) 0.1 0.0 - 0.1 x10E3/uL  POCT Urinalysis Dipstick  Result Value Ref Range   Color, UA yellow    Clarity, UA clear    Glucose, UA Positive (A) Negative   Bilirubin, UA negative    Ketones, UA trace    Spec Grav, UA 1.015  1.010 - 1.025   Blood, UA negative    pH, UA 5.0 5.0 - 8.0   Protein, UA Negative Negative   Urobilinogen, UA 0.2 0.2 or 1.0 E.U./dL   Nitrite, UA negative    Leukocytes, UA Negative Negative   Appearance     Odor        Assessment & Plan:   Problem List Items Addressed This Visit    None    Visit Diagnoses    Pneumonia due to COVID-19 virus    -  Primary   Improving slowly. Continue inhalers and rest. Will start spacer. Will put out of work. Conitnue to monitor. Recheck 1 week.   Relevant Medications   doxycycline (VIBRA-TABS) 100 MG tablet       Follow up plan: Return in about 1 week (around 12/27/2019) for follow up covid.    . This visit was completed via D[employer]ity due to the restrictions of the COVID-19 pandemic. All issues as above were discussed and addressed. Physical exam was done as above through visual confirmation on Doximity. If it was felt that the patient should be evaluated in the office, they were directed there. The patient verbally consented to this visit. . Location of the patient: home . Location of the provider: work . Those involved with this call:  . Provider: Olevia Perches, DO . CMA: Tiffany Reel, CMA . Front Desk/Registration: Adela Ports  . Time spent on call: 15 minutes with patient face to face via video conference. More than 50% of this time was spent in counseling and coordination of care. 23 minutes total spent in review of patient's record and preparation of their chart.

## 2019-12-21 ENCOUNTER — Telehealth: Payer: Self-pay

## 2019-12-21 ENCOUNTER — Ambulatory Visit: Payer: BC Managed Care – PPO | Admitting: Family Medicine

## 2019-12-21 NOTE — Telephone Encounter (Signed)
-----   Message from Sharol Given sent at 12/21/2019  8:43 AM EST ----- Regarding: short term disability Pt wanted to know if his paperwork has been faxed in

## 2019-12-21 NOTE — Telephone Encounter (Signed)
Spoke with Pt and advised forms have been place in Dr. Henriette Combs box to be signed and that we will fax back once completed.

## 2019-12-24 ENCOUNTER — Telehealth (INDEPENDENT_AMBULATORY_CARE_PROVIDER_SITE_OTHER): Payer: BC Managed Care – PPO | Admitting: Family Medicine

## 2019-12-24 ENCOUNTER — Encounter: Payer: Self-pay | Admitting: Family Medicine

## 2019-12-25 NOTE — Progress Notes (Signed)
Patient just seen Thursday. Appointment rescheduled for later this week.

## 2019-12-28 ENCOUNTER — Encounter: Payer: Self-pay | Admitting: Family Medicine

## 2019-12-28 ENCOUNTER — Telehealth (INDEPENDENT_AMBULATORY_CARE_PROVIDER_SITE_OTHER): Payer: BC Managed Care – PPO | Admitting: Family Medicine

## 2019-12-28 VITALS — BP 148/89 | HR 91

## 2019-12-28 DIAGNOSIS — U071 COVID-19: Secondary | ICD-10-CM

## 2019-12-28 DIAGNOSIS — J1282 Pneumonia due to coronavirus disease 2019: Secondary | ICD-10-CM | POA: Diagnosis not present

## 2019-12-28 NOTE — Progress Notes (Signed)
BP (!) 148/89   Pulse 91   SpO2 93%    Subjective:    Patient ID: Casey Spiro., male    DOB: [birth year], 64 y.o.   MRN: [id number]  HPI: Casey Hunter. is a 64 y.o. male  Chief Complaint  Patient presents with  . covid-19   Feeling better, but not great. Currently at about 50%. Went to [employer] and was able to walk about 10 minutes before getting too tired. Oxygen still running about 93%. He is not coughing. Continues to be tired and have back and leg pain. Otherwise doing OK. 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 No other concerns or complaints at this time.   Relevant past medical, surgical, family and social history reviewed and updated as indicated. Interim medical history since our last visit reviewed. Allergies and medications reviewed and updated.  Review of Systems  Constitutional: Positive for fatigue. Negative for activity change, appetite change, chills, diaphoresis, fever and unexpected weight change.  Respiratory: Negative for apnea, cough, choking, chest tightness, shortness of breath, wheezing and stridor.   Cardiovascular: Negative.   Musculoskeletal: Positive for arthralgias, back pain and myalgias. Negative for gait problem, joint swelling, neck pain and neck stiffness.  Skin: Negative.   Neurological: Negative.   Psychiatric/Behavioral: Negative.    Per HPI unless specifically indicated above     Objective:    BP (!) 148/89   Pulse 91   SpO2 93%   Wt Readings from Last 3 Encounters:  12/18/19 266 lb 6.4 oz (120.8 kg)  07/25/18 265 lb (120.2 kg)  04/24/18 261 lb 12.8 oz (118.8 kg)    Physical Exam Vitals and nursing note reviewed.  Constitutional:      General: He is not in acute distress.    Appearance: Normal appearance. He is not ill-appearing, toxic-appearing or diaphoretic.  HENT:     Head: Normocephalic and atraumatic.     Right Ear: External ear  normal.     Left Ear: External ear normal.     Nose: Nose normal.     Mouth/Throat:     Mouth: Mucous membranes are moist.     Pharynx: Oropharynx is clear.  Eyes:     General: No scleral icterus.       Right eye: No discharge.        Left eye: No discharge.     Conjunctiva/sclera: Conjunctivae normal.     Pupils: Pupils are equal, round, and reactive to light.  Pulmonary:     Effort: Pulmonary effort is normal. No respiratory distress.     Comments: Speaking in full sentences Musculoskeletal:        General: Normal range of motion.     Cervical back: Normal range of motion.  Skin:    Coloration: Skin is not jaundiced or pale.     Findings: No bruising, erythema, lesion or rash.  Neurological:     Mental Status: He is alert and oriented to person, place, and time. Mental status is at baseline.  Psychiatric:        Mood and Affect: Mood normal.        Behavior: Behavior normal.        Thought Content: Thought content normal.        Judgment: Judgment normal.     Results for orders placed or performed in visit on 12/18/19  CBC With Differential  Result Value Ref Range   WBC 10.3 3.4 - 10.8 x10E3/uL   RBC 5.50 4.14 - 5.80 x10E6/uL   Hemoglobin 15.6 13.0 - 17.7 g/dL   Hematocrit 02.5 85.2 - 51.0 %   MCV 85 79 - 97 fL   MCH 28.4 26.6 - 33.0 pg   MCHC 33.5 31.5 - 35.7 g/dL   RDW 77.8 24.2 - 35.3 %   Neutrophils 56 Not Estab. %   Lymphs 30 Not Estab. %   Monocytes 10 Not Estab. %   Eos 2 Not Estab. %   Basos 1 Not Estab. %   Neutrophils Absolute 5.8 1.4 - 7.0 x10E3/uL   Lymphocytes Absolute 3.1 0.7 - 3.1 x10E3/uL   Monocytes Absolute 1.1 (H) 0.1 - 0.9 x10E3/uL   EOS (ABSOLUTE) 0.2 0.0 - 0.4 x10E3/uL   Basophils Absolute 0.1 0.0 - 0.2 x10E3/uL   Immature Granulocytes 1 Not Estab. %   Immature Grans (Abs) 0.1 0.0 - 0.1 x10E3/uL  POCT Urinalysis Dipstick  Result Value Ref Range   Color, UA yellow    Clarity, UA clear    Glucose, UA Positive (A) Negative   Bilirubin,  UA negative    Ketones, UA trace    Spec Grav, UA 1.015 1.010 - 1.025   Blood, UA negative    pH, UA 5.0 5.0 - 8.0   Protein, UA Negative Negative   Urobilinogen, UA 0.2 0.2 or 1.0 E.U./dL   Nitrite, UA negative    Leukocytes, UA Negative Negative   Appearance     Odor        Assessment & Plan:   Problem List Items Addressed This Visit    None    Visit Diagnoses    Pneumonia due to COVID-19 virus    -  Primary   Improving, but slowly. Out of work until 01/21/20. Recheck 2 weeks. Call with any concerns.        Follow up plan: Return in about 2 weeks (around 01/11/2020).   . This visit was completed via mychart due to the restrictions of the COVID-19 pandemic. All issues as above were discussed and addressed. Physical exam was done as above through visual confirmation on mychart. If it was felt that the patient should be evaluated in the office, they were directed there. The patient verbally consented to this visit. . Location of the patient: home . Location of the provider: work . Those involved with this call:  . Provider: Olevia Perches, DObl . CMA: Tiffany Reel, CMA . Front Desk/Registration: Adela Ports  . Time spent on call: 15 minutes with patient face to face via video conference. More than 50% of this time was spent in counseling and coordination of care. 23 minutes total spent in review of patient's record and preparation of their chart.

## 2020-01-04 ENCOUNTER — Telehealth: Payer: Self-pay | Admitting: Family Medicine

## 2020-01-04 NOTE — Telephone Encounter (Addendum)
Pt calling to advise his short term disibilty has not been received by [employer]. They called pt today to advise they have not received the info they need, which is records from 12/06/19 to present dealing with his issue.  Pt would like a call back asap to advise. He cannot get paid until they receive the info they need to process.  Pt has a letter they sent him, and he will bring it by today if you need him to. Please call back.

## 2020-01-04 NOTE — Telephone Encounter (Signed)
Pt was advised by pharmacy that the Rx for Dulaglutide (TRULICITY) 1.5 MG/0.5ML SOPN  Needs a PA/ Pt is schedule to take this medication on Sunday and ask if Dr. Laural Benes would be able to have this done today/ please advise

## 2020-01-04 NOTE — Telephone Encounter (Signed)
PA done and approved.  Patient notified.

## 2020-01-09 ENCOUNTER — Other Ambulatory Visit: Payer: Self-pay

## 2020-01-11 ENCOUNTER — Encounter: Payer: Self-pay | Admitting: Nurse Practitioner

## 2020-01-11 ENCOUNTER — Ambulatory Visit
Admission: RE | Admit: 2020-01-11 | Discharge: 2020-01-11 | Disposition: A | Discharge status: Home / Self Care | Payer: BC Managed Care – PPO | Source: Ambulatory Visit | Attending: Nurse Practitioner | Admitting: Nurse Practitioner

## 2020-01-11 ENCOUNTER — Telehealth (INDEPENDENT_AMBULATORY_CARE_PROVIDER_SITE_OTHER): Payer: BC Managed Care – PPO | Admitting: Nurse Practitioner

## 2020-01-11 ENCOUNTER — Other Ambulatory Visit: Payer: Self-pay

## 2020-01-11 ENCOUNTER — Telehealth: Payer: Self-pay | Admitting: Family Medicine

## 2020-01-11 ENCOUNTER — Telehealth: Payer: BC Managed Care – PPO | Admitting: Family Medicine

## 2020-01-11 ENCOUNTER — Ambulatory Visit
Admission: RE | Admit: 2020-01-11 | Discharge: 2020-01-11 | Disposition: A | Discharge status: Home / Self Care | Payer: BC Managed Care – PPO | Source: Ambulatory Visit | Attending: Family Medicine | Admitting: Family Medicine

## 2020-01-11 ENCOUNTER — Other Ambulatory Visit: Payer: Self-pay | Admitting: Nurse Practitioner

## 2020-01-11 VITALS — BP 141/87 | HR 92 | Temp 97.1°F

## 2020-01-11 DIAGNOSIS — Z8616 Personal history of COVID-19: Secondary | ICD-10-CM

## 2020-01-11 DIAGNOSIS — R918 Other nonspecific abnormal finding of lung field: Secondary | ICD-10-CM | POA: Diagnosis not present

## 2020-01-11 NOTE — Telephone Encounter (Signed)
Scheduled mychart for 12:00

## 2020-01-11 NOTE — Telephone Encounter (Signed)
appt

## 2020-01-11 NOTE — Telephone Encounter (Signed)
Pt states that he has been diagnosed with COVID and feels that he is getting no better.  States that at times (especially night) he feels like he is getting worse.  Pt would like to know what PCP recommends he do.

## 2020-01-11 NOTE — Progress Notes (Signed)
BP (!) 141/87   Pulse 92   Temp (!) 97.1 F (36.2 C) (Oral)   SpO2 94%    Subjective:    Patient ID: Page Spiro., male    DOB: Dec 10, 1955, 64 y.o.   MRN: [id number]  HPI: Casey Hunter. is a 64 y.o. male  Chief Complaint  Patient presents with  . Fatigue    pt states has had cold sweets at night on and off since January. pt states has had a positve covid 19 this past January  . Diarrhea  . Knee Pain   Due to the catastrophic nature of the COVID-19 pandemic, this visit was done through audio contact only. This visit was completed via Mychart due to the restrictions of the COVID-19 pandemic. All issues as above were discussed and addressed. Physical exam was done as above through visual confirmation on Mychart. If it was felt that the patient should be evaluated in the office, they were directed there. The patient verbally consented to this visit."} . Location of the patient: home . Location of the provider: work . Those involved with this call:  . Provider: Carnella Guadalajara, DNP . CMA: Lesle Chris, [place] . Front Desk/Registration: Don Perking  . Time spent on call: 15 minutes on the phone discussing health concerns. 20 minutes total spent in review of patient's record and preparation of their chart.    UPPER RESPIRATORY TRACT INFECTION Diagnosed + covid on 12/05/2019. Worst symptoms: cold sweats; got dizzy with them, confusion and forgetfulness has been going on since COVID diagnosis Fever: unknown Cough: no Shortness of breath: yes; not as bad Wheezing: no Chest pain: yes; same as when diagnosed with covid Chest tightness: no Chest congestion: no Nasal congestion: no Runny nose: no Post nasal drip: no Sneezing: no Sore throat: no Swollen glands: no Sinus pressure: no Headache: left side headache Face pain: no Toothache: no Ear pain: no  Ear pressure: no  Eyes red/itching:no Eye drainage/crusting: no  Nausea/Vomiting: yes - since covid  diagnosed Rash: no Fatigue: yes Sick contacts: no Strep contacts: no  Context: stable Recurrent sinusitis: no Relief with OTC cold/cough medications: no  Treatments attempted: none  It bothers him that he can't do half of what he used to be able to do.   Allergies  Allergen Reactions  . Crestor [Rosuvastatin Calcium]     Myalgias  . Lipitor [Atorvastatin]     Myalgia  . Penicillin G Potassium [Penicillin G]   . Sulfur    Outpatient Encounter Medications as of 01/11/2020  Medication Sig  . albuterol (VENTOLIN HFA) 108 (90 Base) MCG/ACT inhaler Inhale 2 puffs into the lungs every 6 (six) hours as needed for wheezing or shortness of breath.  Marland Kitchen amLODipine (NORVASC) 10 MG tablet TAKE 1 TABLET DAILY.  Marland Kitchen aspirin 81 MG tablet Take 81 mg by mouth daily.  . colesevelam (WELCHOL) 625 MG tablet Take 3 tablets (1,875 mg total) by mouth 2 (two) times daily with a meal. Takes 3 tables twice daily  . Continuous Blood Gluc Receiver (FREESTYLE LIBRE 14 DAY READER) DEVI USE AS DIRECTED  . Continuous Blood Gluc Sensor (FREESTYLE LIBRE 14 DAY SENSOR) MISC Inject 1 each into the skin See admin instructions.  . dapagliflozin propanediol (FARXIGA) 10 MG TABS tablet Take 10 mg by mouth daily.  . Dulaglutide (TRULICITY) 1.5 VQ/2.5ZD SOPN Inject 1.5 mg into the skin once a week.  . ezetimibe (ZETIA) 10 MG tablet Take 1 tablet (10 mg total) by mouth  daily.  . glucose blood test strip 1 each by Other route as needed for other. Use as instructed  . lovastatin (MEVACOR) 20 MG tablet Take 1 tablet (20 mg total) by mouth at bedtime.  . meloxicam (MOBIC) 15 MG tablet Take 1 tablet (15 mg total) by mouth daily.  . metFORMIN (GLUCOPHAGE) 1000 MG tablet Take 1 tablet (1,000 mg total) by mouth 2 (two) times daily with a meal.  . niacin (NIASPAN) 1000 MG CR tablet Take 1 tablet (1,000 mg total) by mouth at bedtime.  . Olmesartan-amLODIPine-HCTZ 40-10-25 MG TABS Take 1 tablet by mouth daily.  Marland Kitchen  olmesartan-hydrochlorothiazide (BENICAR HCT) 40-25 MG tablet TAKE 1 TABLET DAILY.  Marland Kitchen ondansetron (ZOFRAN ODT) 8 MG disintegrating tablet Take 1 tablet (8 mg total) by mouth every 8 (eight) hours as needed for nausea or vomiting.  . pregabalin (LYRICA) 50 MG capsule Take 1 capsule (50 mg total) by mouth 3 (three) times daily.  . sildenafil (REVATIO) 20 MG tablet Take 1 tablet (20 mg total) by mouth as needed. 1-5 prn  . sitaGLIPtin (JANUVIA) 100 MG tablet Take 1 tablet (100 mg total) by mouth daily.  Marland Kitchen albuterol (VENTOLIN HFA) 108 (90 Base) MCG/ACT inhaler Inhale 2 puffs into the lungs every 6 (six) hours as needed for up to 7 days for wheezing or shortness of breath.  . sucralfate (CARAFATE) 1 g tablet Take 1 tablet (1 g total) by mouth 4 (four) times daily -  with meals and at bedtime. (Patient not taking: Reported on 01/11/2020)  . [DISCONTINUED] doxycycline (VIBRA-TABS) 100 MG tablet Take 1 tablet (100 mg total) by mouth 2 (two) times daily.   No facility-administered encounter medications on file as of 01/11/2020.   Patient Active Problem List   Diagnosis Date Noted  . History of 2019 novel coronavirus disease (COVID-19) 01/11/2020  . Diabetic peripheral neuropathy associated with type 2 diabetes mellitus (HCC) 03/14/2017  . Knee osteoarthritis 04/14/2016  . Psoriasis 04/14/2016  . Sleep apnea 09/16/2015  . Hypertension associated with type 2 diabetes mellitus (HCC) 09/16/2015  . Hyperlipidemia associated with type 2 diabetes mellitus (HCC) 09/16/2015   Past Medical History:  Diagnosis Date  . Hyperlipidemia   . Hypertension   . Sleep apnea    Review of Systems  Constitutional: Positive for activity change, diaphoresis (at night) and fatigue.  HENT: Negative for congestion, dental problem, ear pain, facial swelling, hearing loss, postnasal drip, rhinorrhea, sinus pressure, sinus pain, sneezing and sore throat.   Eyes: Negative for pain, discharge, redness and itching.  Respiratory:  Positive for shortness of breath. Negative for cough, chest tightness and wheezing.   Cardiovascular: Positive for chest pain (same as previously). Negative for palpitations.  Gastrointestinal: Positive for diarrhea, nausea and vomiting. Negative for abdominal distention, abdominal pain and constipation.  Skin: Negative.  Negative for rash.  Neurological: Positive for dizziness and headaches. Negative for tremors and numbness.  Psychiatric/Behavioral: Positive for confusion and decreased concentration. Negative for agitation and sleep disturbance. The patient is not nervous/anxious.    Per HPI unless specifically indicated above    Objective:    BP (!) 141/87   Pulse 92   Temp (!) 97.1 F (36.2 C) (Oral)   SpO2 94%   Wt Readings from Last 3 Encounters:  12/18/19 266 lb 6.4 oz (120.8 kg)  07/25/18 265 lb (120.2 kg)  04/24/18 261 lb 12.8 oz (118.8 kg)    Physical Exam Vitals and nursing note reviewed.  Constitutional:  General: He is not in acute distress.    Appearance: Normal appearance. He is normal weight. He is not toxic-appearing.  HENT:     Head: Normocephalic and atraumatic.     Right Ear: External ear normal.     Left Ear: External ear normal.     Nose: Nose normal. No congestion or rhinorrhea.     Mouth/Throat:     Mouth: Mucous membranes are moist.     Pharynx: Oropharynx is clear. No oropharyngeal exudate.  Eyes:     General: No scleral icterus.    Extraocular Movements: Extraocular movements intact.  Pulmonary:     Effort: Pulmonary effort is normal. No respiratory distress.  Skin:    Coloration: Skin is not jaundiced or pale.  Neurological:     General: No focal deficit present.     Mental Status: He is alert and oriented to person, place, and time.     Motor: No weakness.  Psychiatric:        Mood and Affect: Mood normal.        Behavior: Behavior normal.        Thought Content: Thought content normal.        Judgment: Judgment normal.    Results  for orders placed or performed in visit on 12/18/19  CBC With Differential  Result Value Ref Range   WBC 10.3 3.4 - 10.8 x10E3/uL   RBC 5.50 4.14 - 5.80 x10E6/uL   Hemoglobin 15.6 13.0 - 17.7 g/dL   Hematocrit 37.8 58.8 - 51.0 %   MCV 85 79 - 97 fL   MCH 28.4 26.6 - 33.0 pg   MCHC 33.5 31.5 - 35.7 g/dL   RDW 50.2 77.4 - 12.8 %   Neutrophils 56 Not Estab. %   Lymphs 30 Not Estab. %   Monocytes 10 Not Estab. %   Eos 2 Not Estab. %   Basos 1 Not Estab. %   Neutrophils Absolute 5.8 1.4 - 7.0 x10E3/uL   Lymphocytes Absolute 3.1 0.7 - 3.1 x10E3/uL   Monocytes Absolute 1.1 (H) 0.1 - 0.9 x10E3/uL   EOS (ABSOLUTE) 0.2 0.0 - 0.4 x10E3/uL   Basophils Absolute 0.1 0.0 - 0.2 x10E3/uL   Immature Granulocytes 1 Not Estab. %   Immature Grans (Abs) 0.1 0.0 - 0.1 x10E3/uL  POCT Urinalysis Dipstick  Result Value Ref Range   Color, UA yellow    Clarity, UA clear    Glucose, UA Positive (A) Negative   Bilirubin, UA negative    Ketones, UA trace    Spec Grav, UA 1.015 1.010 - 1.025   Blood, UA negative    pH, UA 5.0 5.0 - 8.0   Protein, UA Negative Negative   Urobilinogen, UA 0.2 0.2 or 1.0 E.U./dL   Nitrite, UA negative    Leukocytes, UA Negative Negative   Appearance     Odor        Assessment & Plan:   Problem List Items Addressed This Visit      Other   History of 2019 novel coronavirus disease (COVID-19) - Primary    Symptoms ongoing.  It seems all symptoms pt is c/o today started with covid illness.  Given multiple compliants of symptoms that started with covid illness, will continue to monitor these symptoms.  Advised patient to get chest x-ray today to rule out residual pneumonia.  Advised that he can take 1 Tylenol qhs to help prevent fever/night sweats.  Encouraged to rest, stay hydrated, listen to body when  he needs rest, and give self time to fully heal from viral illness.      Relevant Orders   DG Chest 2 View       Follow up plan: Return if symptoms worsen or fail to  improve.

## 2020-01-11 NOTE — Telephone Encounter (Signed)
No. He will likely hear Monday

## 2020-01-11 NOTE — Telephone Encounter (Signed)
Copied from CRM [id number]. Topic: General - Other >> Jan 11, 2020  4:40 PM Laural Benes, [place] C wrote: Reason for CRM: pt would like to know if provider has his imaging results back yet?   Please advise.

## 2020-01-11 NOTE — Assessment & Plan Note (Addendum)
Symptoms ongoing.  It seems all symptoms pt is c/o today started with covid illness.  Given multiple compliants of symptoms that started with covid illness, will continue to monitor these symptoms.  Advised patient to get chest x-ray today to rule out residual pneumonia.  Advised that he can take 1 Tylenol qhs to help prevent fever/night sweats.  Encouraged to rest, stay hydrated, listen to body when he needs rest, and give self time to fully heal from viral illness.

## 2020-01-11 NOTE — Telephone Encounter (Signed)
Patient notified

## 2020-01-14 ENCOUNTER — Telehealth: Payer: Self-pay | Admitting: Nurse Practitioner

## 2020-01-14 MED ORDER — AZITHROMYCIN 250 MG PO TABS: ORAL_TABLET | ORAL | 0 refills | Status: DC

## 2020-01-14 NOTE — Telephone Encounter (Signed)
Patient notified

## 2020-01-14 NOTE — Telephone Encounter (Signed)
Please let Casey Hunter know that his chest x-ray did show some lingering pneumonia.  Since he is still have some symptoms of pneumonia, I would recommend treatment with antibiotics.  I sent in antibiotics to [facility].

## 2020-01-16 ENCOUNTER — Telehealth: Payer: Self-pay

## 2020-01-16 ENCOUNTER — Encounter: Payer: Self-pay | Admitting: Family Medicine

## 2020-01-16 ENCOUNTER — Telehealth (INDEPENDENT_AMBULATORY_CARE_PROVIDER_SITE_OTHER): Payer: BC Managed Care – PPO | Admitting: Family Medicine

## 2020-01-16 VITALS — BP 145/85 | HR 92 | Temp 97.0°F

## 2020-01-16 DIAGNOSIS — E785 Hyperlipidemia, unspecified: Secondary | ICD-10-CM

## 2020-01-16 DIAGNOSIS — E1159 Type 2 diabetes mellitus with other circulatory complications: Secondary | ICD-10-CM | POA: Diagnosis not present

## 2020-01-16 DIAGNOSIS — E1169 Type 2 diabetes mellitus with other specified complication: Secondary | ICD-10-CM | POA: Diagnosis not present

## 2020-01-16 DIAGNOSIS — I10 Essential (primary) hypertension: Secondary | ICD-10-CM | POA: Diagnosis not present

## 2020-01-16 DIAGNOSIS — Z8616 Personal history of COVID-19: Secondary | ICD-10-CM

## 2020-01-16 DIAGNOSIS — E1142 Type 2 diabetes mellitus with diabetic polyneuropathy: Secondary | ICD-10-CM

## 2020-01-16 DIAGNOSIS — I152 Hypertension secondary to endocrine disorders: Secondary | ICD-10-CM

## 2020-01-16 MED ORDER — BUDESONIDE-FORMOTEROL FUMARATE 160-4.5 MCG/ACT IN AERO: 2.0000 | INHALATION_SPRAY | Freq: Two times a day (BID) | RESPIRATORY_TRACT | 3 refills | Status: DC

## 2020-01-16 NOTE — Telephone Encounter (Signed)
Received PA request for Budesonide-Formoterol Fumarate 160 - 4.5.   Suggested alternatives are: Symbicort 160-4.5 Dulera 200-5 Advair HFA 115-21 Breo Ellipta 100-25 Flovent HFA 110 Trelegy Ellipta 100-62.5-25 Breztri Aerosphere  Do you want to send in an alternative or attempt PA for medication?  Key: [id number]

## 2020-01-16 NOTE — Telephone Encounter (Signed)
I wrote for symbicort and they are suggesting symbicort? Will they cover the brand name only? If so- please just have the pharmacy fill the brand name only.

## 2020-01-16 NOTE — Telephone Encounter (Signed)
Since it's still coming up as needing a PA for some reason, please do PA

## 2020-01-16 NOTE — Assessment & Plan Note (Signed)
Notes that he has not been eating well. Will recheck levels and adjust medication as needed. Await results.  

## 2020-01-16 NOTE — Telephone Encounter (Signed)
PA approved.   Called and let patient know of approval.

## 2020-01-16 NOTE — Assessment & Plan Note (Signed)
Notes that he has not been eating well. Will recheck levels and adjust medication as needed. Await results.

## 2020-01-16 NOTE — Progress Notes (Signed)
BP (!) 145/85   Pulse 92   Temp (!) 97 F (36.1 C)   SpO2 93%    Subjective:    Patient ID: Page Spiro., male    DOB: Dec 30, 1955, 64 y.o.   MRN: [id number]  HPI: Casey Hunter. is a 64 y.o. male  Chief Complaint  Patient presents with  . Follow-up    Patient states that he is some better, but still having night sweats, dizziness, confusion and fatigue, body aches and pains, headache at times    Jenny Reichmann notes that he is not doing significantly better. He was seen 5 days ago and had an x-ray which shows resolving covid pneumonia. He notes that he is very tired and sore still. He has brain fog. Has been having cold sweats. No fever. No coughing, continues with SOB. He states that some days he feels a little better, but then he'll go back to feeling worse again- it really depends on the day.  DIABETES Hypoglycemic episodes:no Polydipsia/polyuria: yes Visual disturbance: no Chest pain: no Paresthesias: no Glucose Monitoring: yes  Accucheck frequency: occasionally Taking Insulin?: no Blood Pressure Monitoring: not checking Retinal Examination: Not up to Date Foot Exam: Not up to Date Diabetic Education: Completed Pneumovax: Up to Date Influenza: Up to Date Aspirin: yes  HYPERTENSION / HYPERLIPIDEMIA Satisfied with current treatment? yes Duration of hypertension: chronic BP monitoring frequency: not checking BP medication side effects: no Past BP meds: olmesartan, amlodipine, HCTZ Duration of hyperlipidemia: chronic Cholesterol medication side effects: no Cholesterol supplements: none Past cholesterol medications: lovastatin Medication compliance: excellent compliance Aspirin: yes Recent stressors: no Recurrent headaches: no Visual changes: no Palpitations: no Dyspnea: no Chest pain: no Lower extremity edema: no Dizzy/lightheaded: no  No other concerns or complaints at this time.   Relevant past medical, surgical, family and social history reviewed and  updated as indicated. Interim medical history since our last visit reviewed. Allergies and medications reviewed and updated.  Review of Systems  Constitutional: Positive for chills and fatigue. Negative for activity change, appetite change, diaphoresis, fever and unexpected weight change.  HENT: Negative.   Eyes: Negative.   Respiratory: Positive for shortness of breath. Negative for apnea, cough, choking, chest tightness, wheezing and stridor.   Cardiovascular: Negative.   Gastrointestinal: Negative.   Musculoskeletal: Positive for back pain and myalgias. Negative for arthralgias, gait problem, joint swelling, neck pain and neck stiffness.  Skin: Negative.   Neurological: Negative.   Psychiatric/Behavioral: Negative.     Per HPI unless specifically indicated above     Objective:    BP (!) 145/85   Pulse 92   Temp (!) 97 F (36.1 C)   SpO2 93%   Wt Readings from Last 3 Encounters:  12/18/19 266 lb 6.4 oz (120.8 kg)  07/25/18 265 lb (120.2 kg)  04/24/18 261 lb 12.8 oz (118.8 kg)    Physical Exam Vitals and nursing note reviewed.  Constitutional:      General: He is not in acute distress.    Appearance: Normal appearance. He is not ill-appearing, toxic-appearing or diaphoretic.  HENT:     Head: Normocephalic and atraumatic.     Right Ear: External ear normal.     Left Ear: External ear normal.     Nose: Nose normal.     Mouth/Throat:     Mouth: Mucous membranes are moist.     Pharynx: Oropharynx is clear.  Eyes:     General: No scleral icterus.  Right eye: No discharge.        Left eye: No discharge.     Conjunctiva/sclera: Conjunctivae normal.     Pupils: Pupils are equal, round, and reactive to light.  Pulmonary:     Effort: Pulmonary effort is normal. No respiratory distress.     Comments: Speaking in full sentences Musculoskeletal:        General: Normal range of motion.     Cervical back: Normal range of motion.  Skin:    Coloration: Skin is not  jaundiced or pale.     Findings: No bruising, erythema, lesion or rash.  Neurological:     Mental Status: He is alert and oriented to person, place, and time. Mental status is at baseline.  Psychiatric:        Mood and Affect: Mood normal.        Behavior: Behavior normal.        Thought Content: Thought content normal.        Judgment: Judgment normal.     Results for orders placed or performed in visit on 12/18/19  CBC With Differential  Result Value Ref Range   WBC 10.3 3.4 - 10.8 x10E3/uL   RBC 5.50 4.14 - 5.80 x10E6/uL   Hemoglobin 15.6 13.0 - 17.7 g/dL   Hematocrit 04.8 88.9 - 51.0 %   MCV 85 79 - 97 fL   MCH 28.4 26.6 - 33.0 pg   MCHC 33.5 31.5 - 35.7 g/dL   RDW 16.9 45.0 - 38.8 %   Neutrophils 56 Not Estab. %   Lymphs 30 Not Estab. %   Monocytes 10 Not Estab. %   Eos 2 Not Estab. %   Basos 1 Not Estab. %   Neutrophils Absolute 5.8 1.4 - 7.0 x10E3/uL   Lymphocytes Absolute 3.1 0.7 - 3.1 x10E3/uL   Monocytes Absolute 1.1 (H) 0.1 - 0.9 x10E3/uL   EOS (ABSOLUTE) 0.2 0.0 - 0.4 x10E3/uL   Basophils Absolute 0.1 0.0 - 0.2 x10E3/uL   Immature Granulocytes 1 Not Estab. %   Immature Grans (Abs) 0.1 0.0 - 0.1 x10E3/uL  POCT Urinalysis Dipstick  Result Value Ref Range   Color, UA yellow    Clarity, UA clear    Glucose, UA Positive (A) Negative   Bilirubin, UA negative    Ketones, UA trace    Spec Grav, UA 1.015 1.010 - 1.025   Blood, UA negative    pH, UA 5.0 5.0 - 8.0   Protein, UA Negative Negative   Urobilinogen, UA 0.2 0.2 or 1.0 E.U./dL   Nitrite, UA negative    Leukocytes, UA Negative Negative   Appearance     Odor        Assessment & Plan:   Problem List Items Addressed This Visit      Cardiovascular and Mediastinum   Hypertension associated with type 2 diabetes mellitus (HCC)    Running a little high. Has not been eating well. Will work on [employer] and continue current regimen. Call with any concerns.       Relevant Orders   CBC with  Differential/Platelet   Microalbumin, Urine Waived   Comprehensive metabolic panel     Endocrine   Hyperlipidemia associated with type 2 diabetes mellitus (HCC)    Notes that he has not been eating well. Will recheck levels and adjust medication as needed. Await results.       Relevant Orders   CBC with Differential/Platelet   Lipid Panel w/o Chol/HDL Ratio  Comprehensive metabolic panel   Diabetic peripheral neuropathy associated with type 2 diabetes mellitus (HCC) - Primary    Notes that he has not been eating well. Will recheck levels and adjust medication as needed. Await results.       Relevant Orders   Bayer DCA Hb A1c Waived   CBC with Differential/Platelet   Comprehensive metabolic panel     Other   History of 2019 novel coronavirus disease (COVID-19)    Continues to slowly recover. X-ray improving. Will start symbicort to help with SOB and keep him out of work. Recheck lungs in about 2 weeks. Call with any concerns.       Relevant Orders   CBC with Differential/Platelet   Comprehensive metabolic panel   D-Dimer, Quantitative       Follow up plan: Return in about 2 weeks (around 01/30/2020) for follow up.      . This visit was completed via MyChart due to the restrictions of the COVID-19 pandemic. All issues as above were discussed and addressed. Physical exam was done as above through visual confirmation on MyChart. If it was felt that the patient should be evaluated in the office, they were directed there. The patient verbally consented to this visit. . Location of the patient: home . Location of the provider: home . Those involved with this call:  . Provider: Olevia Perches, DO . CMA: Tiffany Reel, CMA . Front Desk/Registration: Adela Ports  . Time spent on call: 25 minutes with patient face to face via video conference. More than 50% of this time was spent in counseling and coordination of care. 40 minutes total spent in review of patient's record  and preparation of their chart.

## 2020-01-16 NOTE — Telephone Encounter (Signed)
[facility] and explained what was going on. They state that the brand Symbicort is still coming up as needing a PA.

## 2020-01-16 NOTE — Assessment & Plan Note (Signed)
Continues to slowly recover. X-ray improving. Will start symbicort to help with SOB and keep him out of work. Recheck lungs in about 2 weeks. Call with any concerns.

## 2020-01-16 NOTE — Assessment & Plan Note (Signed)
Running a little high. Has not been eating well. Will work on [employer] and continue current regimen. Call with any concerns.

## 2020-01-16 NOTE — Telephone Encounter (Signed)
PA initiated and submitted via Cover My Meds.

## 2020-01-18 ENCOUNTER — Other Ambulatory Visit: Payer: Self-pay | Admitting: Family Medicine

## 2020-01-18 ENCOUNTER — Other Ambulatory Visit: Payer: BC Managed Care – PPO

## 2020-01-18 ENCOUNTER — Other Ambulatory Visit: Payer: Self-pay

## 2020-01-18 DIAGNOSIS — U071 COVID-19: Secondary | ICD-10-CM

## 2020-01-18 DIAGNOSIS — Z8616 Personal history of COVID-19: Secondary | ICD-10-CM | POA: Diagnosis not present

## 2020-01-18 DIAGNOSIS — I10 Essential (primary) hypertension: Secondary | ICD-10-CM | POA: Diagnosis not present

## 2020-01-18 DIAGNOSIS — E1142 Type 2 diabetes mellitus with diabetic polyneuropathy: Secondary | ICD-10-CM | POA: Diagnosis not present

## 2020-01-18 DIAGNOSIS — E1169 Type 2 diabetes mellitus with other specified complication: Secondary | ICD-10-CM | POA: Diagnosis not present

## 2020-01-18 DIAGNOSIS — E1159 Type 2 diabetes mellitus with other circulatory complications: Secondary | ICD-10-CM

## 2020-01-18 DIAGNOSIS — I152 Hypertension secondary to endocrine disorders: Secondary | ICD-10-CM

## 2020-01-18 DIAGNOSIS — E785 Hyperlipidemia, unspecified: Secondary | ICD-10-CM

## 2020-01-18 LAB — MICROALBUMIN, URINE WAIVED
Creatinine, Urine Waived: 50 mg/dL (ref 10–300)
Microalb, Ur Waived: 10 mg/L (ref 0–19)
Microalb/Creat Ratio: <30 mg/g (ref <30)

## 2020-01-18 LAB — BAYER DCA HB A1C WAIVED: HB A1C (BAYER DCA - WAIVED): 9.2 % — ABNORMAL HIGH (ref <7.0)

## 2020-01-18 MED ORDER — TRULICITY 3 MG/0.5ML ~~LOC~~ SOAJ: 3.0000 mg | SUBCUTANEOUS | 1 refills | Status: DC

## 2020-01-19 LAB — CBC WITH DIFFERENTIAL/PLATELET
Basophils Absolute: 0.1 10*3/uL (ref 0.0–0.2)
Basos: 1 %
EOS (ABSOLUTE): 0.2 10*3/uL (ref 0.0–0.4)
Eos: 3 %
Hematocrit: 49.2 % (ref 37.5–51.0)
Hemoglobin: 16.3 g/dL (ref 13.0–17.7)
Immature Grans (Abs): 0 10*3/uL (ref 0.0–0.1)
Immature Granulocytes: 0 %
Lymphocytes Absolute: 2 10*3/uL (ref 0.7–3.1)
Lymphs: 23 %
MCH: 28.5 pg (ref 26.6–33.0)
MCHC: 33.1 g/dL (ref 31.5–35.7)
MCV: 86 fL (ref 79–97)
Monocytes Absolute: 0.7 10*3/uL (ref 0.1–0.9)
Monocytes: 8 %
Neutrophils Absolute: 5.9 10*3/uL (ref 1.4–7.0)
Neutrophils: 65 %
Platelets: 298 10*3/uL (ref 150–450)
RBC: 5.72 x10E6/uL (ref 4.14–5.80)
RDW: 14 % (ref 11.6–15.4)
WBC: 8.9 10*3/uL (ref 3.4–10.8)

## 2020-01-19 LAB — LIPID PANEL W/O CHOL/HDL RATIO
Cholesterol, Total: 203 mg/dL — ABNORMAL HIGH (ref 100–199)
HDL: 49 mg/dL (ref >39)
LDL Chol Calc (NIH): 131 mg/dL — ABNORMAL HIGH (ref 0–99)
Triglycerides: 130 mg/dL (ref 0–149)
VLDL Cholesterol Cal: 23 mg/dL (ref 5–40)

## 2020-01-19 LAB — COMPREHENSIVE METABOLIC PANEL
ALT: 22 IU/L (ref 0–44)
AST: 17 IU/L (ref 0–40)
Albumin/Globulin Ratio: 1.2 (ref 1.2–2.2)
Albumin: 4.3 g/dL (ref 3.8–4.8)
Alkaline Phosphatase: 80 IU/L (ref 39–117)
BUN/Creatinine Ratio: 16 (ref 10–24)
BUN: 14 mg/dL (ref 8–27)
Bilirubin Total: 0.6 mg/dL (ref 0.0–1.2)
CO2: 18 mmol/L — ABNORMAL LOW (ref 20–29)
Calcium: 10 mg/dL (ref 8.6–10.2)
Chloride: 102 mmol/L (ref 96–106)
Creatinine, Ser: 0.89 mg/dL (ref 0.76–1.27)
GFR calc Af Amer: 104 mL/min/{1.73_m2} (ref >59)
GFR calc non Af Amer: 90 mL/min/{1.73_m2} (ref >59)
Globulin, Total: 3.6 g/dL (ref 1.5–4.5)
Glucose: 187 mg/dL — ABNORMAL HIGH (ref 65–99)
Potassium: 4.4 mmol/L (ref 3.5–5.2)
Sodium: 141 mmol/L (ref 134–144)
Total Protein: 7.9 g/dL (ref 6.0–8.5)

## 2020-01-19 LAB — D-DIMER, QUANTITATIVE: D-DIMER: 1.59 mg/L FEU — ABNORMAL HIGH (ref 0.00–0.49)

## 2020-01-21 ENCOUNTER — Telehealth: Payer: Self-pay | Admitting: Family Medicine

## 2020-01-21 ENCOUNTER — Telehealth: Payer: Self-pay

## 2020-01-21 DIAGNOSIS — E1142 Type 2 diabetes mellitus with diabetic polyneuropathy: Secondary | ICD-10-CM

## 2020-01-21 NOTE — Telephone Encounter (Signed)
Referral to [facility] pharmacy sent today Please let him know that Catie will be calling him.

## 2020-01-21 NOTE — Telephone Encounter (Signed)
Called and left patient a VM asking for him to please return my call.  

## 2020-01-21 NOTE — Telephone Encounter (Signed)
Pt returning call. Please advise. °

## 2020-01-21 NOTE — Chronic Care Management (AMB) (Signed)
  Care Management   Note  01/21/2020 Name: Casey Hunter. MRN: [id number] DOB: 1956-03-11  [name]. is a 64 y.o. year old male who is a primary care patient of Maddux, [place], DO. I reached out to [name]. by phone today in response to a referral sent by [name]'s health plan.    [name] was given information about care management services today including:  1. Care management services include personalized support from designated clinical staff supervised by his physician, including individualized plan of care and coordination with other care providers 2. 24/7 contact phone numbers for assistance for urgent and routine care needs. 3. The patient may stop care management services at any time by phone call to the office staff.  Patient agreed to services and verbal consent obtained.   Follow up plan: Telephone appointment with care management team member scheduled for:02/22/2020  Elisha Ponder, LPN Health Advisor, Embedded Care Coordination [facility] Health Care Management ??[email or web address] ??[phone]

## 2020-01-21 NOTE — Telephone Encounter (Signed)
Fax from pharmacy.  Patient's copay for Trulicity is still $150. (I did get medication approved).  They are unable to order. Patient wants to know if there is something cheaper he can take.

## 2020-01-21 NOTE — Telephone Encounter (Signed)
Patient notified

## 2020-01-23 NOTE — Telephone Encounter (Addendum)
Form and records faxed 01/23/2020. Patient notified.

## 2020-01-29 ENCOUNTER — Ambulatory Visit
Admission: RE | Admit: 2020-01-29 | Discharge: 2020-01-29 | Disposition: A | Discharge status: Home / Self Care | Payer: BC Managed Care – PPO | Source: Ambulatory Visit | Attending: Family Medicine | Admitting: Family Medicine

## 2020-01-29 ENCOUNTER — Encounter: Payer: Self-pay | Admitting: Family Medicine

## 2020-01-29 ENCOUNTER — Ambulatory Visit (INDEPENDENT_AMBULATORY_CARE_PROVIDER_SITE_OTHER): Payer: BC Managed Care – PPO | Admitting: Family Medicine

## 2020-01-29 ENCOUNTER — Other Ambulatory Visit: Payer: Self-pay

## 2020-01-29 VITALS — BP 121/78 | HR 94 | Temp 97.5°F

## 2020-01-29 DIAGNOSIS — Z8616 Personal history of COVID-19: Secondary | ICD-10-CM | POA: Diagnosis not present

## 2020-01-29 DIAGNOSIS — M25562 Pain in left knee: Secondary | ICD-10-CM

## 2020-01-29 DIAGNOSIS — M25561 Pain in right knee: Secondary | ICD-10-CM

## 2020-01-29 DIAGNOSIS — M1712 Unilateral primary osteoarthritis, left knee: Secondary | ICD-10-CM | POA: Diagnosis not present

## 2020-01-29 DIAGNOSIS — R066 Hiccough: Secondary | ICD-10-CM

## 2020-01-29 DIAGNOSIS — M1711 Unilateral primary osteoarthritis, right knee: Secondary | ICD-10-CM | POA: Diagnosis not present

## 2020-01-29 MED ORDER — CYCLOBENZAPRINE HCL 10 MG PO TABS: 10.0000 mg | ORAL_TABLET | Freq: Every day | ORAL | 0 refills | Status: DC

## 2020-01-29 NOTE — Assessment & Plan Note (Signed)
Continues to slowly improve. Seeing pulmonology tomorrow. Continue inhalers. Call with any concerns.

## 2020-01-29 NOTE — Progress Notes (Signed)
BP 121/78 (BP Location: Left Arm, Patient Position: Sitting, Cuff Size: Normal)   Pulse 94   Temp (!) 97.5 F (36.4 C) (Oral)   SpO2 95%    Subjective:    Patient ID: Casey Spiro., male    DOB: 27-May-1956, 64 y.o.   MRN: [id number]  HPI: Casey Mahler. is a 64 y.o. male  Chief Complaint  Patient presents with  . COVID   Casey Hunter presents today for follow up on his COVID. Casey Hunter has been instructed that Casey Hunter cannot return to work until Casey Hunter is 100%. They cannot accommodate him at 1/2 time or partial time. Casey Hunter states that Casey Hunter continues to have a lot of discomfort in his epigastric region feeling really tight. Casey Hunter's seeing pulmonology tomorrow. Casey Hunter also notes that since being sick, Casey Hunter's had cramping and pain in both his knees and having cramping in his calves. Casey Hunter continues to be very tired and SOB. Having some occasional cold sweats at night. No fevers. Very little cough. Casey Hunter is feeling better, but notes that it is taking a long time. No other concerns or complaints at this time.   Relevant past medical, surgical, family and social history reviewed and updated as indicated. Interim medical history since our last visit reviewed. Allergies and medications reviewed and updated.  Review of Systems  Constitutional: Positive for chills and fatigue. Negative for activity change, appetite change, diaphoresis, fever and unexpected weight change.  Respiratory: Negative.   Cardiovascular: Negative.   Musculoskeletal: Positive for arthralgias, gait problem and myalgias. Negative for back pain, joint swelling, neck pain and neck stiffness.  Skin: Negative.   Neurological: Positive for weakness. Negative for dizziness, tremors, seizures, syncope, facial asymmetry, speech difficulty, light-headedness, numbness and headaches.  Psychiatric/Behavioral: Negative.     Per HPI unless specifically indicated above     Objective:    BP 121/78 (BP Location: Left Arm, Patient Position: Sitting, Cuff Size: Normal)    Pulse 94   Temp (!) 97.5 F (36.4 C) (Oral)   SpO2 95%   Wt Readings from Last 3 Encounters:  01/30/20 266 lb (120.7 kg)  12/18/19 266 lb 6.4 oz (120.8 kg)  07/25/18 265 lb (120.2 kg)    Physical Exam Vitals and nursing note reviewed.  Constitutional:      General: Casey Hunter is not in acute distress.    Appearance: Normal appearance. Casey Hunter is not ill-appearing, toxic-appearing or diaphoretic.  HENT:     Head: Normocephalic and atraumatic.     Right Ear: External ear normal.     Left Ear: External ear normal.     Nose: Nose normal.     Mouth/Throat:     Mouth: Mucous membranes are moist.     Pharynx: Oropharynx is clear.  Eyes:     General: No scleral icterus.       Right eye: No discharge.        Left eye: No discharge.     Extraocular Movements: Extraocular movements intact.     Conjunctiva/sclera: Conjunctivae normal.     Pupils: Pupils are equal, round, and reactive to light.  Cardiovascular:     Rate and Rhythm: Normal rate and regular rhythm.     Pulses: Normal pulses.     Heart sounds: Normal heart sounds. No murmur. No friction rub. No gallop.   Pulmonary:     Effort: Pulmonary effort is normal. No respiratory distress.     Breath sounds: Normal breath sounds. No stridor. No wheezing, rhonchi or rales.  Chest:     Chest wall: No tenderness.  Musculoskeletal:        General: Normal range of motion.     Cervical back: Normal range of motion and neck supple.  Skin:    General: Skin is warm and dry.     Capillary Refill: Capillary refill takes less than 2 seconds.     Coloration: Skin is not jaundiced or pale.     Findings: No bruising, erythema, lesion or rash.  Neurological:     General: No focal deficit present.     Mental Status: Casey Hunter is alert and oriented to person, place, and time. Mental status is at baseline.  Psychiatric:        Mood and Affect: Mood normal.        Behavior: Behavior normal.        Thought Content: Thought content normal.        Judgment:  Judgment normal.     Results for orders placed or performed in visit on 01/18/20  D-Dimer, Quantitative  Result Value Ref Range   D-DIMER 1.59 (H) 0.00 - 0.49 mg/L FEU  Comprehensive metabolic panel  Result Value Ref Range   Glucose 187 (H) 65 - 99 mg/dL   BUN 14 8 - 27 mg/dL   Creatinine, Ser 4.40 0.76 - 1.27 mg/dL   GFR calc non Af Amer 90 >59 mL/min/1.73   GFR calc Af Amer 104 >59 mL/min/1.73   BUN/Creatinine Ratio 16 10 - 24   Sodium 141 134 - 144 mmol/L   Potassium 4.4 3.5 - 5.2 mmol/L   Chloride 102 96 - 106 mmol/L   CO2 18 (L) 20 - 29 mmol/L   Calcium 10.0 8.6 - 10.2 mg/dL   Total Protein 7.9 6.0 - 8.5 g/dL   Albumin 4.3 3.8 - 4.8 g/dL   Globulin, Total 3.6 1.5 - 4.5 g/dL   Albumin/Globulin Ratio 1.2 1.2 - 2.2   Bilirubin Total 0.6 0.0 - 1.2 mg/dL   Alkaline Phosphatase 80 39 - 117 IU/L   AST 17 0 - 40 IU/L   ALT 22 0 - 44 IU/L  Microalbumin, Urine Waived  Result Value Ref Range   Microalb, Ur Waived 10 0 - 19 mg/L   Creatinine, Urine Waived 50 10 - 300 mg/dL   Microalb/Creat Ratio <30 <30 mg/g  Lipid Panel w/o Chol/HDL Ratio  Result Value Ref Range   Cholesterol, Total 203 (H) 100 - 199 mg/dL   Triglycerides 347 0 - 149 mg/dL   HDL 49 >42 mg/dL   VLDL Cholesterol Cal 23 5 - 40 mg/dL   LDL Chol Calc (NIH) 595 (H) 0 - 99 mg/dL  CBC with Differential/Platelet  Result Value Ref Range   WBC 8.9 3.4 - 10.8 x10E3/uL   RBC 5.72 4.14 - 5.80 x10E6/uL   Hemoglobin 16.3 13.0 - 17.7 g/dL   Hematocrit 63.8 75.6 - 51.0 %   MCV 86 79 - 97 fL   MCH 28.5 26.6 - 33.0 pg   MCHC 33.1 31.5 - 35.7 g/dL   RDW 43.3 29.5 - 18.8 %   Platelets 298 150 - 450 x10E3/uL   Neutrophils 65 Not Estab. %   Lymphs 23 Not Estab. %   Monocytes 8 Not Estab. %   Eos 3 Not Estab. %   Basos 1 Not Estab. %   Neutrophils Absolute 5.9 1.4 - 7.0 x10E3/uL   Lymphocytes Absolute 2.0 0.7 - 3.1 x10E3/uL   Monocytes Absolute 0.7 0.1 - 0.9 x10E3/uL  EOS (ABSOLUTE) 0.2 0.0 - 0.4 x10E3/uL   Basophils  Absolute 0.1 0.0 - 0.2 x10E3/uL   Immature Granulocytes 0 Not Estab. %   Immature Grans (Abs) 0.0 0.0 - 0.1 x10E3/uL  Bayer DCA Hb A1c Waived  Result Value Ref Range   HB A1C (BAYER DCA - WAIVED) 9.2 (H) <7.0 %      Assessment & Plan:   Problem List Items Addressed This Visit      Other   History of 2019 novel coronavirus disease (COVID-19) - Primary    Continues to slowly improve. Seeing pulmonology tomorrow. Continue inhalers. Call with any concerns.        Other Visit Diagnoses    Spasm of diaphragm       Will start him on some flexeril to see if it helps with the spasm. Seeing pulmonology tomorrow. Call with any concerns.    Acute pain of both knees       Will obtain x-rays of his knees. Await results. Treat as needed.    Relevant Orders   DG Knee Complete 4 Views Left (Completed)   DG Knee Complete 4 Views Right (Completed)       Follow up plan: Return in about 3 weeks (around 02/19/2020).

## 2020-01-30 ENCOUNTER — Encounter: Payer: Self-pay | Admitting: Internal Medicine

## 2020-01-30 ENCOUNTER — Ambulatory Visit (INDEPENDENT_AMBULATORY_CARE_PROVIDER_SITE_OTHER): Payer: BC Managed Care – PPO

## 2020-01-30 ENCOUNTER — Ambulatory Visit (INDEPENDENT_AMBULATORY_CARE_PROVIDER_SITE_OTHER): Payer: BC Managed Care – PPO | Admitting: Internal Medicine

## 2020-01-30 DIAGNOSIS — R0609 Other forms of dyspnea: Secondary | ICD-10-CM

## 2020-01-30 DIAGNOSIS — R0600 Dyspnea, unspecified: Secondary | ICD-10-CM | POA: Diagnosis not present

## 2020-01-30 LAB — CBC WITH DIFFERENTIAL/PLATELET
Basophils Absolute: 0.1 10*3/uL (ref 0.0–0.1)
Basophils Relative: 1 % (ref 0.0–3.0)
Eosinophils Absolute: 0.5 10*3/uL (ref 0.0–0.7)
Eosinophils Relative: 4 % (ref 0.0–5.0)
HCT: 46.3 % (ref 39.0–52.0)
Hemoglobin: 15.6 g/dL (ref 13.0–17.0)
Lymphocytes Relative: 23.3 % (ref 12.0–46.0)
Lymphs Abs: 2.6 10*3/uL (ref 0.7–4.0)
MCHC: 33.8 g/dL (ref 30.0–36.0)
MCV: 85.1 fl (ref 78.0–100.0)
Monocytes Absolute: 0.8 10*3/uL (ref 0.1–1.0)
Monocytes Relative: 6.9 % (ref 3.0–12.0)
Neutro Abs: 7.3 10*3/uL (ref 1.4–7.7)
Neutrophils Relative %: 64.8 % (ref 43.0–77.0)
Platelets: 269 10*3/uL (ref 150.0–400.0)
RBC: 5.44 Mil/uL (ref 4.22–5.81)
RDW: 14.8 % (ref 11.5–15.5)
WBC: 11.3 10*3/uL — ABNORMAL HIGH (ref 4.0–10.5)

## 2020-01-30 LAB — BASIC METABOLIC PANEL
BUN: 21 mg/dL (ref 6–23)
CO2: 22 mEq/L (ref 19–32)
Calcium: 9.6 mg/dL (ref 8.4–10.5)
Chloride: 100 mEq/L (ref 96–112)
Creatinine, Ser: 0.8 mg/dL (ref 0.40–1.50)
GFR: 97.2 mL/min (ref >60.00)
Glucose, Bld: 159 mg/dL — ABNORMAL HIGH (ref 70–99)
Potassium: 3.7 mEq/L (ref 3.5–5.1)
Sodium: 134 mEq/L — ABNORMAL LOW (ref 135–145)

## 2020-01-30 LAB — D-DIMER, QUANTITATIVE: D-Dimer, Quant: 1.07 mcg/mL FEU — ABNORMAL HIGH (ref <0.50)

## 2020-01-30 LAB — TROPONIN I (HIGH SENSITIVITY): High Sens Troponin I: 6 ng/L (ref 2–17)

## 2020-01-30 LAB — SEDIMENTATION RATE: Sed Rate: 45 mm/hr — ABNORMAL HIGH (ref 0–20)

## 2020-01-30 LAB — BRAIN NATRIURETIC PEPTIDE: Pro B Natriuretic peptide (BNP): 19 pg/mL (ref 0.0–100.0)

## 2020-01-30 NOTE — Progress Notes (Signed)
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 zpak      History of Present Illness  01/30/2020  Pulmonary/ 1st office eval/Casey Hunter  Chief Complaint  Patient presents with  . Pulmonary Consult    Referred by Dr Olevia Perches. Pt states having chest discomfort since dx with covid in Jan 2020. 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 He uses his albuterol inhaler about 3 x per wk on average.   Dyspnea:  Can only walk a block or two fatigue and sob and no cp with ex  Cough: none  Sleep: able to lie flat / no pain  SABA use: no better  Pain is less freq but will last sev hours / ? Some better with deep breath   Past Medical History:  Diagnosis Date  . Hyperlipidemia   . Hypertension   . Sleep apnea     Outpatient Medications Prior to Visit  Medication Sig Dispense Refill  . albuterol (VENTOLIN HFA) 108 (90 Base) MCG/ACT inhaler Inhale 2 puffs into the lungs every 6 (six) hours as needed for up to 7 days for wheezing or shortness of breath. 18 g 8  . albuterol (VENTOLIN HFA) 108 (90 Base) MCG/ACT inhaler Inhale 2 puffs into the lungs every 6 (six) hours as needed for wheezing or shortness of breath. 18 g 0  . amLODipine (NORVASC) 10 MG tablet TAKE 1 TABLET DAILY. 30 tablet 5  . aspirin 81 MG tablet Take 81 mg by mouth daily.    . budesonide-formoterol (SYMBICORT) 160-4.5 MCG/ACT inhaler Inhale 2 puffs into the lungs 2 (two) times daily. 1 Inhaler 3  . colesevelam (WELCHOL) 625 MG tablet Take 3 tablets (1,875 mg total) by mouth 2 (two) times daily with a meal. Takes 3 tables twice daily 540 tablet 4  . Continuous Blood Gluc Receiver (FREESTYLE LIBRE 14 DAY READER) DEVI USE AS DIRECTED 1 Device 12  .  Continuous Blood Gluc Sensor (FREESTYLE LIBRE 14 DAY SENSOR) MISC Inject 1 each into the skin See admin instructions. 2 each 12  . cyclobenzaprine (FLEXERIL) 10 MG tablet Take 1 tablet (10 mg total) by mouth at bedtime. 30 tablet 0  . dapagliflozin propanediol (FARXIGA) 10 MG TABS tablet Take 10 mg by mouth daily. 90 tablet 4  . Dulaglutide (TRULICITY) 3 MG/0.5ML SOPN Inject 3 mg into the skin once a week. 12 pen 1  . ezetimibe (ZETIA) 10 MG tablet Take 1 tablet (10 mg total) by mouth daily. 90 tablet 4  . glucose blood test strip 1 each by Other route as needed for other. Use as instructed    . lovastatin (MEVACOR) 20 MG tablet Take 1 tablet (20 mg total) by mouth at bedtime. 90 tablet 4  . meloxicam (MOBIC) 15 MG tablet Take 1 tablet (15 mg total) by mouth daily. 90 tablet 4  . metFORMIN (GLUCOPHAGE) 1000 MG tablet Take 1 tablet (1,000 mg total) by mouth 2 (two) times daily with a meal. 180 tablet 4  . niacin (NIASPAN) 1000 MG CR tablet Take 1 tablet (1,000 mg total) by mouth at bedtime. 90 tablet 4  . Olmesartan-amLODIPine-HCTZ 40-10-25 MG TABS  Take 1 tablet by mouth daily. 90 tablet 4  . olmesartan-hydrochlorothiazide (BENICAR HCT) 40-25 MG tablet TAKE 1 TABLET DAILY. 30 tablet 5  . ondansetron (ZOFRAN ODT) 8 MG disintegrating tablet Take 1 tablet (8 mg total) by mouth every 8 (eight) hours as needed for nausea or vomiting. 20 tablet 0  . pregabalin (LYRICA) 50 MG capsule Take 1 capsule (50 mg total) by mouth 3 (three) times daily. 270 capsule 1  . sildenafil (REVATIO) 20 MG tablet Take 1 tablet (20 mg total) by mouth as needed. 1-5 prn 50 tablet 12  . sitaGLIPtin (JANUVIA) 100 MG tablet Take 1 tablet (100 mg total) by mouth daily. 90 tablet 4  . sucralfate (CARAFATE) 1 g tablet Take 1 tablet (1 g total) by mouth 4 (four) times daily -  with meals and at bedtime. 120 tablet 1      Objective:     BP 118/68 (BP Location: Left Arm, Cuff Size: Normal)   Pulse 95   Temp 97.6 F (36.4 C)  (Temporal)   Ht 6\' 2"  (1.88 m)   Wt 266 lb (120.7 kg)   SpO2 95% Comment: on RA  BMI 34.15 kg/m   SpO2: 95 %(on RA)            I personally reviewed images and agree with radiology impression as follows:  CXR PA and Lateral:  01/11/20 Decreased hazy curvilinear mid to lower left lung opacities, compatible with resolving COVID-19 pneumonia. No acute interval cardiopulmonary disease.    Assessment   No problem-specific Assessment & Plan notes found for this encounter.     Casey Gully, MD 01/30/2020

## 2020-01-30 NOTE — Patient Instructions (Signed)
Pepcid 20 mg after bfast and supper and stop all inhalers  Classic    pain pattern suggests ibs:  very limited distribution of pain locations, daytime, not usually exacerbated by exercise  or coughing, worse in sitting position, frequently associated with generalized abd bloating, not as likely to be present supine due to the dome effect of the diaphragm which  is  canceled in that position. Frequently these patients have had multiple negative GI workups and CT scans.  Treatment consists of avoiding foods that cause gas (especially boiled eggs, mexcican food but especially  beans and undercooked vegetables like  spinach and some salads)  and citrucel 1 heaping tsp twice daily with a large glass of water.  Pain should improve w/in 2 weeks and if not then consider further GI work up.      Please remember to go to the lab and x-ray department   for your tests - we will call you with the results when they are available.  Make sure you check your oxygen saturations at highest level of activity to be sure it stays over 90%       Return in 3 weeks - sooner if condition worsens on above over the counter meds

## 2020-01-31 ENCOUNTER — Telehealth: Payer: Self-pay | Admitting: *Deleted

## 2020-01-31 ENCOUNTER — Encounter: Payer: Self-pay | Admitting: Internal Medicine

## 2020-01-31 DIAGNOSIS — R0609 Other forms of dyspnea: Secondary | ICD-10-CM

## 2020-01-31 DIAGNOSIS — R0600 Dyspnea, unspecified: Secondary | ICD-10-CM

## 2020-01-31 NOTE — Progress Notes (Signed)
Spoke with pt and notified of results per Dr. Wert. Pt verbalized understanding and denied any questions. 

## 2020-01-31 NOTE — Telephone Encounter (Signed)
Spoke with patient  - turns out he was placed on carafate during N and V phase of covid and stopped it prior to onset of his ant cp onset.   Ok with rx as outlined already. F/u can be in April as long as trending better - o/w will call.

## 2020-01-31 NOTE — Telephone Encounter (Signed)
-----   Message from Nyoka Cowden, MD sent at 01/31/2020  7:37 AM EST ----- After review of entire record also recommend echo next available dx doe

## 2020-01-31 NOTE — Telephone Encounter (Signed)
Spoke with the pt  He was okay with scheduling ECHO  He wants to know if MW really thinks there is something wrong with his heart  He also states that MW wanted him back in 3 wks and he did not have anything until 4-5 wks and he wants to know if this is a big deal. Please advise thanks!

## 2020-01-31 NOTE — Progress Notes (Addendum)
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 zpak > no change so referred to pulmonary clinic 01/30/2020 by [name]     History of Present Illness  01/30/2020  Pulmonary/ 1st office eval/Keirra Zeimet  Chief Complaint  Patient presents with  . Pulmonary Consult    Referred by Dr Park Liter. Pt states having chest discomfort since dx with covid in Jan 2020. 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 He uses his albuterol inhaler about 3 x per wk on average.   Dyspnea:  Can only walk a block or two fatigue and sob but  no cp with ex  Cough: none  Sleep: able to lie flat / no pain  SABA use: no better p rx  Pain is less freq last few weeks but still can last up to  sev hours / ? Some better with deep breath never supine/ tends to wax and wane and ? Worse with spicy foods which he can no longer tolerate nor any form of [place] food but still eating beans   No obvious day to day or daytime variability or assoc excess/ purulent sputum or mucus plugs or hemoptysis  or chest tightness, subjective wheeze or overt sinus or hb symptoms.   Sleeping ok flat without nocturnal  or early am exacerbation  of respiratory  c/o's or need for noct saba. Also denies any obvious fluctuation of symptoms with weather or environmental changes or other aggravating or alleviating factors except as outlined above   No unusual exposure hx or h/o childhood pna/ asthma or knowledge of premature birth.  Current Allergies, Complete Past Medical History, Past Surgical History, Family History, and Social  History were reviewed in [facility] record.  ROS  The following are not active complaints unless bolded Hoarseness, sore throat, dysphagia, dental problems, itching, sneezing,  nasal congestion or discharge of excess mucus or purulent secretions, ear ache,   fever, chills, sweats, unintended wt loss or wt gain, classically pleuritic or exertional cp,  orthopnea pnd or arm/hand swelling  or leg swelling, presyncope, palpitations, abdominal pain, anorexia, nausea, vomiting, diarrhea  or change in bowel habits or change in bladder habits, change in stools or change in urine, dysuria, hematuria,  rash, arthralgias, visual complaints, headache, numbness, weakness or ataxia or problems with walking or coordination,  change in mood or  memory.            Past Medical History:  Diagnosis Date  . Hyperlipidemia   . Hypertension   . Sleep apnea     Outpatient Medications Prior to Visit  Medication Sig Dispense Refill  . albuterol (VENTOLIN HFA) 108 (90 Base) MCG/ACT inhaler Inhale 2 puffs into the lungs every 6 (six) hours as needed for up to 7 days for wheezing or shortness of breath. 18 g 8  . albuterol (VENTOLIN HFA) 108 (90 Base) MCG/ACT inhaler Inhale 2 puffs into the lungs every 6 (six) hours as  needed for wheezing or shortness of breath. 18 g 0  . amLODipine (NORVASC) 10 MG tablet TAKE 1 TABLET DAILY. 30 tablet 5  . aspirin 81 MG tablet Take 81 mg by mouth daily.    . budesonide-formoterol (SYMBICORT) 160-4.5 MCG/ACT inhaler Inhale 2 puffs into the lungs 2 (two) times daily. 1 Inhaler 3  . colesevelam (WELCHOL) 625 MG tablet Take 3 tablets (1,875 mg total) by mouth 2 (two) times daily with a meal. Takes 3 tables twice daily 540 tablet 4  . Continuous Blood Gluc Receiver (FREESTYLE LIBRE 14 DAY READER) DEVI USE AS DIRECTED 1 Device 12  . Continuous Blood Gluc Sensor (FREESTYLE LIBRE 14 DAY SENSOR) MISC Inject 1 each into the skin See admin instructions. 2 each 12  .  cyclobenzaprine (FLEXERIL) 10 MG tablet Take 1 tablet (10 mg total) by mouth at bedtime. 30 tablet 0  . dapagliflozin propanediol (FARXIGA) 10 MG TABS tablet Take 10 mg by mouth daily. 90 tablet 4  . Dulaglutide (TRULICITY) 3 KP/5.3ZS SOPN Inject 3 mg into the skin once a week. 12 pen 1  . ezetimibe (ZETIA) 10 MG tablet Take 1 tablet (10 mg total) by mouth daily. 90 tablet 4  . glucose blood test strip 1 each by Other route as needed for other. Use as instructed    . lovastatin (MEVACOR) 20 MG tablet Take 1 tablet (20 mg total) by mouth at bedtime. 90 tablet 4  . meloxicam (MOBIC) 15 MG tablet Take 1 tablet (15 mg total) by mouth daily. 90 tablet 4  . metFORMIN (GLUCOPHAGE) 1000 MG tablet Take 1 tablet (1,000 mg total) by mouth 2 (two) times daily with a meal. 180 tablet 4  . niacin (NIASPAN) 1000 MG CR tablet Take 1 tablet (1,000 mg total) by mouth at bedtime. 90 tablet 4  . Olmesartan-amLODIPine-HCTZ 40-10-25 MG TABS Take 1 tablet by mouth daily. 90 tablet 4  . olmesartan-hydrochlorothiazide (BENICAR HCT) 40-25 MG tablet TAKE 1 TABLET DAILY. 30 tablet 5  . ondansetron (ZOFRAN ODT) 8 MG disintegrating tablet Take 1 tablet (8 mg total) by mouth every 8 (eight) hours as needed for nausea or vomiting. 20 tablet 0  . pregabalin (LYRICA) 50 MG capsule Take 1 capsule (50 mg total) by mouth 3 (three) times daily. 270 capsule 1  . sildenafil (REVATIO) 20 MG tablet Take 1 tablet (20 mg total) by mouth as needed. 1-5 prn 50 tablet 12  . sitaGLIPtin (JANUVIA) 100 MG tablet Take 1 tablet (100 mg total) by mouth daily. 90 tablet 4  . sucralfate (CARAFATE) 1 g tablet  stopped prior to onset of cp        Objective:     BP 118/68 (BP Location: Left Arm, Cuff Size: Normal)   Pulse 95   Temp 97.6 F (36.4 C) (Temporal)   Ht _0  (1.88 m)   Wt 266 lb (120.7 kg)   SpO2 95% Comment: on RA  BMI 34.15 kg/m   SpO2: 95 %(on RA)   Mod obese pleasant amb wm nad   HEENT : pt wearing mask not removed for  exam due to covid -19 concerns.    NECK :  without JVD/Nodes/TM/ nl carotid upstrokes bilaterally   LUNGS: no acc muscle use,  Nl contour chest which is clear to A and P bilaterally without cough on insp or exp maneuvers   CV:  RRR  no s3 or murmur or increase in P2, and no edema   ABD:  Obese soft and  nontender with nl inspiratory excursion in the supine position. No bruits or organomegaly appreciated, bowel sounds nl  MS:  Nl gait/ ext warm without deformities, calf tenderness, cyanosis or clubbing No obvious joint restrictions   SKIN: warm and dry without lesions    NEURO:  alert, approp, nl sensorium with  no motor or cerebellar deficits apparent.           I personally reviewed images and agree with radiology impression as follows:  CXR PA and Lateral:  01/11/20 Decreased hazy curvilinear mid to lower left lung opacities, compatible with resolving COVID-19 pneumonia. No acute interval cardiopulmonary disease.   CXR PA and Lateral:   01/30/2020 :    I personally reviewed images and agree with radiology impression as follows:    No active cardiopulmonary disease.  Labs ordered/ reviewed:      Chemistry      Component Value Date/Time   NA 134 (L) 01/30/2020 1111   NA 141 01/18/2020 0822   NA 139 11/22/2012 0903   K 3.7 01/30/2020 1111   K 3.8 11/22/2012 0903   CL 100 01/30/2020 1111   CL 106 11/22/2012 0903   CO2 22 01/30/2020 1111   CO2 24 11/22/2012 0903   BUN 21 01/30/2020 1111   BUN 14 01/18/2020 0822   BUN 17 11/22/2012 0903   CREATININE 0.80 01/30/2020 1111   CREATININE 0.94 11/22/2012 0903      Component Value Date/Time   CALCIUM 9.6 01/30/2020 1111   CALCIUM 9.2 11/22/2012 0903   ALKPHOS 80 01/18/2020 0822   ALKPHOS 107 11/22/2012 0903   AST 17 01/18/2020 0822   AST 28 04/24/2018 0820   AST 20 11/22/2012 0903   ALT 22 01/18/2020 0822   ALT 26 04/24/2018 0820   ALT 33 11/22/2012 0903   BILITOT 0.6 01/18/2020 0822   BILITOT 0.4 11/22/2012  0903        Lab Results  Component Value Date   WBC 11.3 (H) 01/30/2020   HGB 15.6 01/30/2020   HCT 46.3 01/30/2020   MCV 85.1 01/30/2020   PLT 269.0 01/30/2020       EOS                                                              0.5                                     01/30/2020   Lab Results  Component Value Date   DDIMER 1.07 (H) 01/30/2020       D Dimer                                                         1.59                                  01/19/20      Lab Results  Component Value Date   PROBNP 19.0 01/30/2020  Lab Results  Component Value Date   ESRSEDRATE 45 (H) 01/30/2020          Assessment   DOE (dyspnea on exertion) Onset jan 2021 with covid 19  - assoc with atypical cp developed during the infection present mostly sitting/ absent supine/ better with deep breathing/ no worse with ex - 01/30/2020   Walked RA x two laps =  approx 581f @ brisk pace - stopped due to end of study, no sob/ no change cp  with sats of 93 % at the end of the study and no acute ekg changes    He has recovered nicely from covid 19 pna with lingering fatigue > sob and a cp pattern that might be concerning for pericarditis with elevated esr though note has no pleuritic features and no rub heard and always resolves in supine position more c/w IBS =   subdiaphragmatic pain pattern,   Stereotypical  with a very limited distribution of pain locations, daytime, not usually exacerbated by exercise  or coughing, worse in sitting position, frequently associated with generalized abd bloating, not as likely to be present supine due to the dome effect of the diaphragm which  is  canceled in that position.    Treatment consists of avoiding foods that cause gas (especially boiled eggs, mexcican food but especially  beans and undercooked vegetables like  spinach and some salads)  and citrucel 1 heaping tsp twice daily with a large glass of water.  Pain should improve w/in 2 weeks and if not then  consider further GI work up.     He is at risk of dvt/ pe but this doesn't cause midline pain and his acute elevation in d dimer is expected from covid and trending down and no sob with exertion today so very unlikely here.  rec Trial of pepcid/citrucel x 2 weeks since has a chronic pattern and proceed with elective echo to r/o pericarditis then  consider formal cards w/u.   Discussed in detail all the  indications, usual  risks and alternatives  relative to the benefits with patient who agrees to proceed with w/u as outlined.            Each maintenance medication was reviewed in detail including emphasizing most importantly the difference between maintenance and prns and under what circumstances the prns are to be triggered using an action plan format where appropriate.  Total time for H and P, chart review, counseling,  directly observing portions of ambulatory 02 saturation study/  and generating customized AVS unique to this new pt office visit / charting = 60 min          MChristinia Gully MD 01/30/2020

## 2020-01-31 NOTE — Assessment & Plan Note (Addendum)
Onset jan 2021 with covid 19  - assoc with atypical cp developed during the infection present mostly sitting/ absent supine/ better with deep breathing/ no worse with ex - 01/30/2020   Walked RA x two laps =  approx 521f @ brisk pace - stopped due to end of study, no sob/ no change cp  with sats of 93 % at the end of the study and no acute ekg changes    He has recovered nicely from covid 19 pna with lingering fatigue > sob and a cp pattern that might be concerning for pericarditis with elevated esr though note has no pleuritic features and no rub heard and always resolves in supine position more c/w IBS =   subdiaphragmatic pain pattern,   Stereotypical  with a very limited distribution of pain locations, daytime, not usually exacerbated by exercise  or coughing, worse in sitting position, frequently associated with generalized abd bloating, not as likely to be present supine due to the dome effect of the diaphragm which  is  canceled in that position.    Treatment consists of avoiding foods that cause gas (especially boiled eggs, mexcican food but especially  beans and undercooked vegetables like  spinach and some salads)  and citrucel 1 heaping tsp twice daily with a large glass of water.  Pain should improve w/in 2 weeks and if not then consider further GI work up.     He is at risk of dvt/ pe but this doesn't cause midline pain and his acute elevation in d dimer is expected from covid and trending down and no sob with exertion today so very unlikely here.  rec Trial of pepcid/citrucel x 2 weeks since has a chronic pattern and proceed with elective echo to r/o pericarditis then  consider formal cards w/u.   Discussed in detail all the  indications, usual  risks and alternatives  relative to the benefits with patient who agrees to proceed with w/u as outlined.         Each maintenance medication was reviewed in detail including emphasizing most importantly the difference between maintenance and  prns and under what circumstances the prns are to be triggered using an action plan format where appropriate.  Total time for H and P, chart review, counseling,  directly observing portions of ambulatory 02 saturation study/  and generating customized AVS unique to this new pt office visit / charting = 60 min

## 2020-02-05 ENCOUNTER — Telehealth: Payer: Self-pay

## 2020-02-05 NOTE — Telephone Encounter (Signed)
Paperwork placed in folder for signature, will fax when completed.  Copied from CRM [id number]. Topic: General - Other >> Jan 21, 2020  8:29 AM Gwenlyn Fudge wrote: Reason for CRM: Pt called stating that PCP was supposed to send medical records to [employer] so that he can continue to get his short term disability. Please advise. >> Feb 05, 2020  9:13 AM Gwenlyn Fudge wrote: Pt called stating that [employer] still has not received this paperwork and he is needing it to receive his check. Please advise.  >> Jan 21, 2020 11:52 AM Pablo Ledger, CMA wrote: Marybelle Killings has been received. Placed in Dr. Henriette Combs folder for signature.  >> Jan 21, 2020  8:38 AM Harriet Pho wrote: Sitting paperwork from [employer] in the back for review

## 2020-02-05 NOTE — Telephone Encounter (Signed)
Results sent via mychart. Please read off results.

## 2020-02-05 NOTE — Telephone Encounter (Signed)
Forms have been faxed to [facility].

## 2020-02-05 NOTE — Telephone Encounter (Signed)
Pt stating he has not heard anything regarding his most recent blood work or imaging he had done.

## 2020-02-05 NOTE — Telephone Encounter (Signed)
Spoke with pt and advised of results. Pt stated he needs to be called with results as he does not use MyChart and only signed up for his video visit.

## 2020-02-08 NOTE — Telephone Encounter (Signed)
Casey Hunter called back requesting another fax of the office notes from this visit on January 29 2020  Fax: [phone] Best contact:  [phone] Claim number: [id number]

## 2020-02-08 NOTE — Telephone Encounter (Signed)
Note printed and faxed as requested.

## 2020-02-12 ENCOUNTER — Ambulatory Visit: Payer: BC Managed Care – PPO | Admitting: Pharmacist

## 2020-02-12 DIAGNOSIS — E1142 Type 2 diabetes mellitus with diabetic polyneuropathy: Secondary | ICD-10-CM

## 2020-02-12 DIAGNOSIS — E785 Hyperlipidemia, unspecified: Secondary | ICD-10-CM

## 2020-02-12 DIAGNOSIS — E1169 Type 2 diabetes mellitus with other specified complication: Secondary | ICD-10-CM

## 2020-02-12 DIAGNOSIS — I152 Hypertension secondary to endocrine disorders: Secondary | ICD-10-CM

## 2020-02-12 DIAGNOSIS — E1159 Type 2 diabetes mellitus with other circulatory complications: Secondary | ICD-10-CM

## 2020-02-12 NOTE — Chronic Care Management (AMB) (Signed)
Chronic Care Management   Note  02/12/2020 Name: Casey Hunter. MRN: [id number] DOB: 1956-02-22   Subjective:  Casey Hunter. is a 64 y.o. year old male who is a primary care patient of Rae, Sutcliffe, DO. The CCM team was consulted for assistance with chronic disease management and care coordination needs.    Contacted patient for medication management review  Review of patient status, including review of consultants reports, laboratory and other test data, was performed as part of comprehensive evaluation and provision of chronic care management services.   SDOH (Social Determinants of Health) assessments and interventions performed:  yes  Objective:  Lab Results  Component Value Date   CREATININE 0.80 01/30/2020   CREATININE 0.89 01/18/2020   CREATININE 0.83 08/24/2019    Lab Results  Component Value Date   HGBA1C 9.2 (H) 01/18/2020       Component Value Date/Time   CHOL 203 (H) 01/18/2020 0822   CHOL 150 04/24/2018 0820   CHOL 166 11/22/2012 0903   TRIG 130 01/18/2020 0822   TRIG 174 (H) 04/24/2018 0820   TRIG 97 11/22/2012 0903   HDL 49 01/18/2020 0822   HDL 40 11/22/2012 0903   CHOLHDL 4.6 08/24/2019 0835   VLDL 35 (H) 04/24/2018 0820   VLDL 19 11/22/2012 0903   LDLCALC 131 (H) 01/18/2020 0822   LDLCALC 107 (H) 11/22/2012 0903    Clinical ASCVD: No  The 10-year ASCVD risk score Mikey Bussing DC Jr., et al., 2013) is: 22.3%   Values used to calculate the score:     Age: 25 years     Sex: Male     Is Non-Hispanic African American: No     Diabetic: Yes     Tobacco smoker: No     Systolic Blood Pressure: 578 mmHg     Is BP treated: Yes     HDL Cholesterol: 49 mg/dL     Total Cholesterol: 203 mg/dL    BP Readings from Last 3 Encounters:  01/30/20 118/68  01/29/20 121/78  01/16/20 (!) 145/85    Allergies  Allergen Reactions  . Crestor [Rosuvastatin Calcium]     Myalgias  . Lipitor [Atorvastatin]     Myalgia  . Penicillin G Potassium [Penicillin  G]   . Sulfur     Medications Reviewed Today    Reviewed by De Hollingshead, [facility] (Pharmacist) on 02/12/20 at [place] List Status: <None>  Medication Order Taking? Sig Documenting Provider Last Dose Status Informant  amLODipine (NORVASC) 10 MG tablet [id number] Yes TAKE 1 TABLET DAILY. Guadalupe Maple, MD Taking Active   aspirin 81 MG tablet [id number] Yes Take 81 mg by mouth daily. [provider] Taking Active   colesevelam [facility]) 625 MG tablet [id number] Yes Take 3 tablets (1,875 mg total) by mouth 2 (two) times daily with a meal. Takes 3 tables twice daily Crissman, Jeannette How, MD Taking Active   Continuous Blood Gluc Receiver (FREESTYLE LIBRE 14 DAY READER) DEVI [id number] Yes USE AS DIRECTED Crissman, Jeannette How, MD Taking Active   Continuous Blood Gluc Sensor (FREESTYLE LIBRE [place]) [place] [id number] Yes Inject 1 each into the skin See admin instructions. Johnson, Megan P, DO Taking Active   cyclobenzaprine (FLEXERIL) 10 MG tablet [id number] Yes Take 1 tablet (10 mg total) by mouth at bedtime. Johnson, Megan P, DO Taking Active   dapagliflozin propanediol (FARXIGA) 10 MG TABS tablet [id number] Yes Take 10 mg by mouth daily. Guadalupe Maple, MD  Taking Active   Dulaglutide (TRULICITY) 3 YD/7.4JO SOPN [id number] Yes Inject 3 mg into the skin once a week. Park Liter P, DO Taking Active   ezetimibe (ZETIA) 10 MG tablet [id number] Yes Take 1 tablet (10 mg total) by mouth daily. Guadalupe Maple, MD Taking Active   glucose blood test strip [id number] Yes 1 each by Other route as needed for other. Use as instructed [provider] Taking Active   lovastatin (MEVACOR) 20 MG tablet [id number] Yes Take 1 tablet (20 mg total) by mouth at bedtime. Guadalupe Maple, MD Taking Active   meloxicam (MOBIC) 15 MG tablet [id number] Yes Take 1 tablet (15 mg total) by mouth daily. Guadalupe Maple, MD Taking Active   metFORMIN (GLUCOPHAGE) 1000 MG tablet [id number] Yes Take 1 tablet (1,000 mg  total) by mouth 2 (two) times daily with a meal. Crissman, Jeannette How, MD Taking Active   niacin (NIASPAN) 1000 MG CR tablet [id number] Yes Take 1 tablet (1,000 mg total) by mouth at bedtime. Guadalupe Maple, MD Taking Active   olmesartan-hydrochlorothiazide [facility] HCT) 40-25 MG tablet [id number] Yes TAKE 1 TABLET DAILY. Guadalupe Maple, MD Taking Active   pregabalin (LYRICA) 50 MG capsule [id number] Yes Take 1 capsule (50 mg total) by mouth 3 (three) times daily. Guadalupe Maple, MD Taking Active   sildenafil (REVATIO) 20 MG tablet [id number] Yes Take 1 tablet (20 mg total) by mouth as needed. 1-5 prn Guadalupe Maple, MD Taking Active   sitaGLIPtin (JANUVIA) 100 MG tablet [id number] Yes Take 1 tablet (100 mg total) by mouth daily. Guadalupe Maple, MD Taking Active            Assessment:   Goals Addressed            This Visit's Progress     Patient Stated   . PharmD "I just take so many medications" (pt-stated)       CARE PLAN ENTRY (see longtitudinal plan of care for additional care plan information)  Current Barriers:  . Diabetes: uncontrolled, complicated by chronic medical conditions including HTN, HLD, OA, psoriasis, s/p COVID, most recent A1c 9.2% o Notes that he feels he is still recovering from [place]. Reports that he feels he can go back to work, but does have a very physical job. Worries about having to get an ECHO and if he will have HF d/t COVID. Denies any use of inhalers recently, notes that Dr. Melvyn Novas "told me to throw in the trash" o Notes that he is "not good with technology" and doesn't utilize [place] about why he is on "so many medications" . Most recent eGFR: >90 mL/min . Current antihyperglycemic regimen: metformin 5638 mg BID, Trulicity 3 mg weekly, Farxiga 10 mg daily, Januvia 100 mg daily  o Utilizing copay card for Trulicity, cost is now $93/TDSKA . Current blood glucose readings: Utilizing FreeStyle Libre CGM, though not scanning Q6H o Fasting:  180-200 o Midnight - 2 am: 100-115 . Cardiovascular risk reduction: o Current hypertensive regimen: Amlodipine 10 mg daily, olmesartan/HCTZ 40/25 mg daily; BP at goal on last check o Current hyperlipidemia regimen: Welchol 1875 mg TID, lovastatin 20 mg daily, ezetimibe 10 mg daily, niacin 1000 mg daily; last LDL not at goal, was 131, TG controlled at 130  - Hx severe muscle pain w/ rosuvastatin 10 mg twice weekly, atorvastatin, dose unknown - Notes that he doesn't like taking niacin because of flushing. Denies hx using fenofibrate o Current antiplatelet regimen: ASA 81  mg  . Osteoarthritis: meloxicam 15 mg daily, pregabalin 50 mg TID, Cyclobenzaprine 10 mg daily,  Pharmacist Clinical Goal(s):  Marland Kitchen Over the next 90 days, patient will work with PharmD and primary care provider to address optimized medication management  Interventions: . Comprehensive medication review performed, medication list updated in electronic medical record . Discussed utilization of copay cards for all brand name medications moving forward. He verbalized understanding.  . Discussed scanning at least Q6H to review all glucose readings over 24 hours. Talked through how to see daily trends to see if he is dipping low over night (eg Somogyi effect causing higher AM readings vs Dawn phenomenon). Sent LibreView app sign up email. Patient notes he'll try to sign up for this so that glucose readings can be reviewed remotely.  . Recommend d/c Januvia d/t duplicative therapy w/ Trulicity and to reduce pill burden. Recommended he discuss w/ Dr. Wynetta Emery at upcoming appointment . Reviewed current HLD regimen. Greater LDL reduction is needed. Discussed PCSK9 therapy to target LDL, patient notes he is amenable to this. Consider d/c niacin, follow TG to determine need for fenofibrate or Lovaza.  Patient Self Care Activities:  . Patient will check blood glucose Q6H , document, and provide at future appointments . Patient will take  medications as prescribed . Patient will report any questions or concerns to provider   Initial goal documentation        Plan: - Scheduled f/u call 04/01/20  Catie Darnelle Maffucci, PharmD, [place] [phone]

## 2020-02-12 NOTE — Patient Instructions (Addendum)
Casey Hunter,   It was great talking with you today!  Here is what each medication is for:   Diabetes: metformin 1000 mg twice daily, Trulicity 3 mg weekly, Farxiga 10 mg daily, Januvia 100 mg daily   Blood pressure: amlodipine 10 mg daily, benazepril/hydrochlorothiazide 40/25 mg daily  Cholesterol: lovastatin 20 mg daily, ezetimibe 10 mg daily, niacin 1000 mg daily  Pain: pregabalin 50 mg TID, meloxicam 15 mg daily, cyclobenzaprine (muscle relaxer)  Stroke Prevention: aspirin 81 mg daily  Talk with Dr. Wynetta Emery about how to consolidate your diabetes and cholesterol regimens.   I've scheduled a follow up call May 11 at 1 pm. Let me know if this needs to be rescheduled.  Visit Information  Goals Addressed            This Visit's Progress     Patient Stated   . PharmD "I just take so many medications" (pt-stated)       CARE PLAN ENTRY (see longtitudinal plan of care for additional care plan information)  Current Barriers:  . Diabetes: uncontrolled, complicated by chronic medical conditions including HTN, HLD, OA, psoriasis, s/p COVID, most recent A1c 9.2% o Notes that he feels he is still recovering from [place]. Reports that he feels he can go back to work, but does have a very physical job. Worries about having to get an ECHO and if he will have HF d/t COVID. Denies any use of inhalers recently, notes that Dr. Melvyn Novas "told me to throw in the trash" o Notes that he is "not good with technology" and doesn't utilize [place] about why he is on "so many medications" . Most recent eGFR: >90 mL/min . Current antihyperglycemic regimen: metformin 1941 mg BID, Trulicity 3 mg weekly, Farxiga 10 mg daily, Januvia 100 mg daily  o Utilizing copay card for Trulicity, cost is now $74/YCXKG . Current blood glucose readings: Utilizing FreeStyle Libre CGM, though not scanning Q6H o Fasting: 180-200 o Midnight - 2 am: 100-115 . Cardiovascular risk reduction: o Current hypertensive  regimen: Amlodipine 10 mg daily, olmesartan/HCTZ 40/25 mg daily; BP at goal on last check o Current hyperlipidemia regimen: Welchol 1875 mg TID, lovastatin 20 mg daily, ezetimibe 10 mg daily, niacin 1000 mg daily; last LDL not at goal, was 131, TG controlled at 130  - Hx severe muscle pain w/ rosuvastatin 10 mg twice weekly, atorvastatin, dose unknown - Notes that he doesn't like taking niacin because of flushing. Denies hx using fenofibrate o Current antiplatelet regimen: ASA 81 mg  . Osteoarthritis: meloxicam 15 mg daily, pregabalin 50 mg TID, Cyclobenzaprine 10 mg daily,  Pharmacist Clinical Goal(s):  Marland Kitchen Over the next 90 days, patient will work with PharmD and primary care provider to address optimized medication management  Interventions: . Comprehensive medication review performed, medication list updated in electronic medical record . Discussed utilization of copay cards for all brand name medications moving forward. He verbalized understanding.  . Discussed scanning at least Q6H to review all glucose readings over 24 hours. Talked through how to see daily trends to see if he is dipping low over night (eg Somogyi effect causing higher AM readings vs Dawn phenomenon). Sent LibreView app sign up email. Patient notes he'll try to sign up for this so that glucose readings can be reviewed remotely.  . Recommend d/c Januvia d/t duplicative therapy w/ Trulicity and to reduce pill burden. Recommended he discuss w/ Dr. Wynetta Emery at upcoming appointment . Reviewed current HLD regimen. Greater LDL reduction is  needed. Discussed PCSK9 therapy to target LDL, patient notes he is amenable to this. Consider d/c niacin, follow TG to determine need for fenofibrate or Lovaza.  Patient Self Care Activities:  . Patient will check blood glucose Q6H , document, and provide at future appointments . Patient will take medications as prescribed . Patient will report any questions or concerns to provider   Initial goal  documentation        The patient verbalized understanding of instructions provided today and agreed to receive a mailed copy of patient instruction and/or educational materials.  Plan: - Scheduled f/u call 04/01/20  Catie Darnelle Maffucci, PharmD, [place] [phone]

## 2020-02-13 ENCOUNTER — Ambulatory Visit (INDEPENDENT_AMBULATORY_CARE_PROVIDER_SITE_OTHER): Payer: BC Managed Care – PPO

## 2020-02-13 ENCOUNTER — Other Ambulatory Visit: Payer: Self-pay

## 2020-02-13 DIAGNOSIS — R0600 Dyspnea, unspecified: Secondary | ICD-10-CM

## 2020-02-13 DIAGNOSIS — R0609 Other forms of dyspnea: Secondary | ICD-10-CM

## 2020-02-13 MED ORDER — PERFLUTREN LIPID MICROSPHERE
1.0000 mL | INTRAVENOUS | Status: AC | PRN
  Administered 2020-02-13: 2 mL via INTRAVENOUS

## 2020-02-21 ENCOUNTER — Ambulatory Visit (INDEPENDENT_AMBULATORY_CARE_PROVIDER_SITE_OTHER): Payer: BC Managed Care – PPO | Admitting: Family Medicine

## 2020-02-21 ENCOUNTER — Other Ambulatory Visit: Payer: Self-pay

## 2020-02-21 ENCOUNTER — Encounter: Payer: Self-pay | Admitting: Family Medicine

## 2020-02-21 VITALS — BP 123/79 | HR 93 | Temp 97.9°F | Ht 74.0 in | Wt 264.6 lb

## 2020-02-21 DIAGNOSIS — Z8616 Personal history of COVID-19: Secondary | ICD-10-CM

## 2020-02-22 ENCOUNTER — Telehealth: Payer: BC Managed Care – PPO

## 2020-02-24 NOTE — Progress Notes (Signed)
BP 123/79 (BP Location: Left Arm, Patient Position: Sitting, Cuff Size: Normal)   Pulse 93   Temp 97.9 F (36.6 C) (Oral)   Ht 6\' 2"  (1.88 m)   Wt 264 lb 9.6 oz (120 kg)   SpO2 96%   BMI 33.97 kg/m    Subjective:    Patient ID: Page Spiro., male    DOB: 12/13/1955, 64 y.o.   MRN: [id number]  HPI: Casey Hunter. is a 64 y.o. male  Chief Complaint  Patient presents with  . COVID19 Follow-up   Feeling much much better. Significantly less tired. Less SOB. Has seen pulmonology and has been doing well with them. No fevers. No chills. No chest pain. He does have some cramps in his legs and continues with knee pain. He is otherwise feeling well. He feels like he can start work again- but thinks he can only work 3 days a week. No other concerns or complaints at this time.   Relevant past medical, surgical, family and social history reviewed and updated as indicated. Interim medical history since our last visit reviewed. Allergies and medications reviewed and updated.  Review of Systems  Constitutional: Positive for fatigue. Negative for activity change, appetite change, chills, diaphoresis, fever and unexpected weight change.  HENT: Negative.   Respiratory: Positive for shortness of breath. Negative for apnea, cough, choking, chest tightness, wheezing and stridor.   Cardiovascular: Negative.   Musculoskeletal: Negative.   Psychiatric/Behavioral: Negative.    Per HPI unless specifically indicated above     Objective:    BP 123/79 (BP Location: Left Arm, Patient Position: Sitting, Cuff Size: Normal)   Pulse 93   Temp 97.9 F (36.6 C) (Oral)   Ht 6\' 2"  (1.88 m)   Wt 264 lb 9.6 oz (120 kg)   SpO2 96%   BMI 33.97 kg/m   Wt Readings from Last 3 Encounters:  02/21/20 264 lb 9.6 oz (120 kg)  01/30/20 266 lb (120.7 kg)  12/18/19 266 lb 6.4 oz (120.8 kg)    Physical Exam Vitals and nursing note reviewed.  Constitutional:      General: He is not in acute distress.  Appearance: Normal appearance. He is not ill-appearing, toxic-appearing or diaphoretic.  HENT:     Head: Normocephalic and atraumatic.     Right Ear: External ear normal.     Left Ear: External ear normal.     Nose: Nose normal.     Mouth/Throat:     Mouth: Mucous membranes are moist.     Pharynx: Oropharynx is clear.  Eyes:     General: No scleral icterus.       Right eye: No discharge.        Left eye: No discharge.     Extraocular Movements: Extraocular movements intact.     Conjunctiva/sclera: Conjunctivae normal.     Pupils: Pupils are equal, round, and reactive to light.  Cardiovascular:     Rate and Rhythm: Normal rate and regular rhythm.     Pulses: Normal pulses.     Heart sounds: Normal heart sounds. No murmur. No friction rub. No gallop.   Pulmonary:     Effort: Pulmonary effort is normal. No respiratory distress.     Breath sounds: Normal breath sounds. No stridor. No wheezing, rhonchi or rales.  Chest:     Chest wall: No tenderness.  Musculoskeletal:        General: Normal range of motion.     Cervical back: Normal  range of motion and neck supple.  Skin:    General: Skin is warm and dry.     Capillary Refill: Capillary refill takes less than 2 seconds.     Coloration: Skin is not jaundiced or pale.     Findings: No bruising, erythema, lesion or rash.  Neurological:     General: No focal deficit present.     Mental Status: He is alert and oriented to person, place, and time. Mental status is at baseline.  Psychiatric:        Mood and Affect: Mood normal.        Behavior: Behavior normal.        Thought Content: Thought content normal.        Judgment: Judgment normal.     Results for orders placed or performed in visit on 01/30/20  D-dimer, quantitative (not at [facility])  Result Value Ref Range   D-Dimer, Quant 1.07 (H) <0.50 mcg/mL FEU  Sedimentation rate  Result Value Ref Range   Sed Rate 45 (H) 0 - 20 mm/hr  CBC with Differential/Platelet  Result Value  Ref Range   WBC 11.3 (H) 4.0 - 10.5 K/uL   RBC 5.44 4.22 - 5.81 Mil/uL   Hemoglobin 15.6 13.0 - 17.0 g/dL   HCT 41.9 37.9 - 02.4 %   MCV 85.1 78.0 - 100.0 fl   MCHC 33.8 30.0 - 36.0 g/dL   RDW 09.7 35.3 - 29.9 %   Platelets 269.0 150.0 - 400.0 K/uL   Neutrophils Relative % 64.8 43.0 - 77.0 %   Lymphocytes Relative 23.3 12.0 - 46.0 %   Monocytes Relative 6.9 3.0 - 12.0 %   Eosinophils Relative 4.0 0.0 - 5.0 %   Basophils Relative 1.0 0.0 - 3.0 %   Neutro Abs 7.3 1.4 - 7.7 K/uL   Lymphs Abs 2.6 0.7 - 4.0 K/uL   Monocytes Absolute 0.8 0.1 - 1.0 K/uL   Eosinophils Absolute 0.5 0.0 - 0.7 K/uL   Basophils Absolute 0.1 0.0 - 0.1 K/uL  Brain natriuretic peptide  Result Value Ref Range   Pro B Natriuretic peptide (BNP) 19.0 0.0 - 100.0 pg/mL  Basic metabolic panel  Result Value Ref Range   Sodium 134 (L) 135 - 145 mEq/L   Potassium 3.7 3.5 - 5.1 mEq/L   Chloride 100 96 - 112 mEq/L   CO2 22 19 - 32 mEq/L   Glucose, Bld 159 (H) 70 - 99 mg/dL   BUN 21 6 - 23 mg/dL   Creatinine, Ser 2.42 0.40 - 1.50 mg/dL   GFR 68.34 >19.62 mL/min   Calcium 9.6 8.4 - 10.5 mg/dL  Troponin I (High Sensitivity)  Result Value Ref Range   High Sens Troponin I 6 2 - 17 ng/L      Assessment & Plan:   Problem List Items Addressed This Visit      Other   History of 2019 novel coronavirus disease (COVID-19) - Primary    Continues to improve. OK to go back to work 3 days a week. Recheck 1 month. Call with any concerns.           Follow up plan: Return in about 4 weeks (around 03/20/2020).

## 2020-02-24 NOTE — Assessment & Plan Note (Signed)
Continues to improve. OK to go back to work 3 days a week. Recheck 1 month. Call with any concerns.

## 2020-03-03 ENCOUNTER — Encounter: Payer: Self-pay | Admitting: Internal Medicine

## 2020-03-03 ENCOUNTER — Other Ambulatory Visit: Payer: Self-pay

## 2020-03-03 ENCOUNTER — Ambulatory Visit (INDEPENDENT_AMBULATORY_CARE_PROVIDER_SITE_OTHER): Payer: BC Managed Care – PPO | Admitting: Internal Medicine

## 2020-03-03 DIAGNOSIS — R0600 Dyspnea, unspecified: Secondary | ICD-10-CM | POA: Diagnosis not present

## 2020-03-03 DIAGNOSIS — R0609 Other forms of dyspnea: Secondary | ICD-10-CM

## 2020-03-03 NOTE — Progress Notes (Addendum)
Page Spiro., male    DOB: [birth year]     MRN: [id number]   Brief patient profile:  55 yowm never smoker works as Engineer, building services s resp limitations then 1st week of January 2021 fatigue sob N and V but no cough dx covid19 ib  Jan 11 in [place] never needed 02 and rx with pred/ benlizumab/carafate  then developed anterior midline lower cp off carafate Jan 26th  so did cxr "it was pna" rx  abx rx with more abx >  no change so second round ? zpak > no change so referred to pulmonary clinic 01/30/2020 by [name]     History of Present Illness  01/30/2020  Pulmonary/ 1st office eval/Casey Hunter  Chief Complaint  Patient presents with  . Pulmonary Consult    Referred by Dr Park Liter. Pt states having chest discomfort since dx with covid in Jan 2020. 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 He uses his albuterol inhaler about 3 x per wk on average.   Dyspnea:  Can only walk a block or two fatigue and sob but  no cp with ex  Cough: none  Sleep: able to lie flat / no pain  SABA use: no better p rx  Pain is less freq last few weeks but still can last up to  sev hours / ? Some better with deep breath never supine/ tends to wax and wane and ? Worse with spicy foods which he can no longer tolerate nor any form of [place] food but still eating beans  rec Pepcid 20 mg after bfast and supper and stop all inhalers citrucel 1 heaping tsp twice daily with a large glass of water.  Pain should improve w/in 2 weeks and if not then consider further GI work up.    Return in 3 weeks - sooner if condition worsens on above over the counter meds    03/03/2020  f/u ov/Casey Hunter re: post covid doe Chief Complaint  Patient presents with  . Follow-up    Breathing is some better. He c/o lack of energy.   Dyspnea:  Feels breathing back to nl  Cough: none  Sleeping: able to lie flat/ not waking up due to resp problem SABA use: none  02: none  Upper chest pain resolved but having  periumbilical pain s change bowel or urinary symptoms mostly daytime, no effect on appetite,  1-2 on scale of 10     No obvious day to day or daytime variability or assoc excess/ purulent sputum or mucus plugs or hemoptysis or cp or chest tightness, subjective wheeze or overt sinus or hb symptoms.   sleeping without nocturnal  or early am exacerbation  of respiratory  c/o's or need for noct saba. Also denies any obvious fluctuation of symptoms with weather or environmental changes or other aggravating or alleviating factors except as outlined above   No unusual exposure hx or h/o childhood pna/ asthma or knowledge of premature birth.  Current Allergies, Complete Past Medical History, Past Surgical History, Family History, and Social History were reviewed in [employer] record.  ROS  The following are not active complaints unless bolded Hoarseness, sore throat, dysphagia, dental problems, itching, sneezing,  nasal congestion or discharge of excess mucus or purulent secretions, ear ache,   fever, chills, sweats, unintended wt loss or wt gain, classically pleuritic or exertional cp,  orthopnea pnd or arm/hand swelling  or leg swelling, presyncope,  palpitations, abdominal pain, anorexia, nausea, vomiting, diarrhea  or change in bowel habits or change in bladder habits, change in stools or change in urine, dysuria, hematuria,  rash, arthralgias, visual complaints, headache, numbness, weakness or ataxia or problems with walking or coordination,  change in mood or  memory.        Current Meds  Medication Sig  . amLODipine (NORVASC) 10 MG tablet TAKE 1 TABLET DAILY.  Marland Kitchen aspirin 81 MG tablet Take 81 mg by mouth daily.  . colesevelam (WELCHOL) 625 MG tablet Take 3 tablets (1,875 mg total) by mouth 2 (two) times daily with a meal. Takes 3 tables twice daily  . Continuous Blood Gluc Receiver (FREESTYLE LIBRE 14 DAY READER) DEVI USE AS DIRECTED  . Continuous Blood Gluc Sensor  (FREESTYLE LIBRE 14 DAY SENSOR) MISC Inject 1 each into the skin See admin instructions.  . cyclobenzaprine (FLEXERIL) 10 MG tablet Take 1 tablet (10 mg total) by mouth at bedtime.  . dapagliflozin propanediol (FARXIGA) 10 MG TABS tablet Take 10 mg by mouth daily.  . Dulaglutide (TRULICITY) 3 MG/0.5ML SOPN Inject 3 mg into the skin once a week.  . ezetimibe (ZETIA) 10 MG tablet Take 1 tablet (10 mg total) by mouth daily.  Marland Kitchen lovastatin (MEVACOR) 20 MG tablet Take 1 tablet (20 mg total) by mouth at bedtime.  . meloxicam (MOBIC) 15 MG tablet Take 1 tablet (15 mg total) by mouth daily.  . metFORMIN (GLUCOPHAGE) 1000 MG tablet Take 1 tablet (1,000 mg total) by mouth 2 (two) times daily with a meal.  . niacin (NIASPAN) 1000 MG CR tablet Take 1 tablet (1,000 mg total) by mouth at bedtime.  Marland Kitchen olmesartan-hydrochlorothiazide (BENICAR HCT) 40-25 MG tablet TAKE 1 TABLET DAILY.  Marland Kitchen pregabalin (LYRICA) 50 MG capsule Take 1 capsule (50 mg total) by mouth 3 (three) times daily.  . sildenafil (REVATIO) 20 MG tablet Take 1 tablet (20 mg total) by mouth as needed. 1-5 prn                     Past Medical History:  Diagnosis Date  . Hyperlipidemia   . Hypertension   . Sleep apnea        Objective:     Wt Readings from Last 3 Encounters:  03/03/20 268 lb (121.6 kg)  02/21/20 264 lb 9.6 oz (120 kg)  01/30/20 266 lb (120.7 kg)     Vital signs reviewed - Note on arrival 02 sats  98% on RA       obese pleasant wm nad   HEENT : pt wearing mask not removed for exam due to covid -19 concerns.    NECK :  without JVD/Nodes/TM/ nl carotid upstrokes bilaterally   LUNGS: no acc muscle use,  Nl contour chest which is clear to A and P bilaterally without cough on insp or exp maneuvers   CV:  RRR  no s3 or murmur or increase in P2, and no edema   ABD:  Obese soft and nontender with nl inspiratory excursion in the supine position. No bruits or organomegaly appreciated, bowel sounds nl  MS:  Nl gait/  ext warm without deformities, calf tenderness, cyanosis or clubbing No obvious joint restrictions   SKIN: warm and dry without lesions    NEURO:  alert, approp, nl sensorium with  no motor or cerebellar deficits apparent.            Assessment

## 2020-03-03 NOTE — Patient Instructions (Signed)
Be as active as you can be and call if losing ground with exertion.  Contact your PCP re abdominal pain   Pulmonary follow up is as needed

## 2020-03-04 ENCOUNTER — Encounter: Payer: Self-pay | Admitting: Internal Medicine

## 2020-03-04 NOTE — Assessment & Plan Note (Addendum)
Onset jan 2021 with covid 19  - assoc with atypical cp developed during the infection present mostly sitting/ absent supine/ better with deep breathing/ no worse with ex - 01/30/2020   Walked RA x two laps =  approx 527ft @ brisk pace - stopped due to end of study, no sob/ no change cp  with sats of 93 % at the end of the study and no acute ekg changes  - Echo  02/13/2020  wnl with  no pericadial effusion - 03/03/2020   Walked RA x two laps =  approx 559ft @ fast pace - stopped due to end of study s sob  with sats of 92 % at the end of the study.  All acute symptoms have resolved with no residual cough, sob or cp so pulmonary f/u is prn   Advised: Make sure you check your oxygen saturations at highest level of activity to be sure it stays over 90% and call if losing ground with ex/ sats.  Referred back to PCP with residual sporadic low grade abd pain ? IBS?           Each maintenance medication was reviewed in detail including emphasizing most importantly the difference between maintenance and prns and under what circumstances the prns are to be triggered using an action plan format where appropriate.  Total time for H and P, chart review, counseling,  directly observing portions of ambulatory 02 saturation study/  and generating customized AVS unique to this office visit / charting = 20 min

## 2020-03-20 ENCOUNTER — Ambulatory Visit (INDEPENDENT_AMBULATORY_CARE_PROVIDER_SITE_OTHER): Payer: BC Managed Care – PPO | Admitting: Family Medicine

## 2020-03-20 ENCOUNTER — Encounter: Payer: Self-pay | Admitting: Family Medicine

## 2020-03-20 ENCOUNTER — Other Ambulatory Visit: Payer: Self-pay

## 2020-03-20 VITALS — BP 94/66 | HR 98 | Temp 97.9°F | Wt 270.0 lb

## 2020-03-20 DIAGNOSIS — M25562 Pain in left knee: Secondary | ICD-10-CM

## 2020-03-20 DIAGNOSIS — M25561 Pain in right knee: Secondary | ICD-10-CM

## 2020-03-20 DIAGNOSIS — Z8616 Personal history of COVID-19: Secondary | ICD-10-CM | POA: Diagnosis not present

## 2020-03-20 DIAGNOSIS — I952 Hypotension due to drugs: Secondary | ICD-10-CM

## 2020-03-20 MED ORDER — AMLODIPINE BESYLATE 5 MG PO TABS: 5.0000 mg | ORAL_TABLET | Freq: Every day | ORAL | 1 refills | Status: DC

## 2020-03-20 NOTE — Progress Notes (Signed)
BP 94/66 (BP Location: Left Arm, Patient Position: Sitting, Cuff Size: Large)   Pulse 98   Temp 97.9 F (36.6 C) (Oral)   Wt 270 lb (122.5 kg)   SpO2 95%   BMI 34.67 kg/m    Subjective:    Patient ID: Page Spiro., male    DOB: 01/17/1956, 64 y.o.   MRN: [id number]  HPI: Casey Hunter. is a 64 y.o. male  Chief Complaint  Patient presents with  . Follow-up    covid   Has been going back to work. He notes that he was very tired after 1.5 days a for the first 2 weeks, then started to feel very tired after 2.5 days after the 2nd 2 weeks, still feeling very tired, but slowly getting better and feeling more like himself. He continues with brain fog and leg pain. Doing better after his moderna vaccine.   HYPERTENSION Hypertension status: overtreated  Satisfied with current treatment? no Duration of hypertension: chronic BP monitoring frequency:  a few times a week BP range: 90s/50s-60s BP medication side effects:  no Medication compliance: excellent compliance Aspirin: yes Recurrent headaches: no Visual changes: no Palpitations: no Dyspnea: no Chest pain: no Lower extremity edema: no Dizzy/lightheaded: yes   Relevant past medical, surgical, family and social history reviewed and updated as indicated. Interim medical history since our last visit reviewed. Allergies and medications reviewed and updated.  Review of Systems  Constitutional: Positive for fatigue. Negative for activity change, appetite change, chills, diaphoresis, fever and unexpected weight change.  HENT: Negative.   Respiratory: Negative.   Cardiovascular: Negative.   Musculoskeletal: Positive for back pain and myalgias. Negative for arthralgias, gait problem, joint swelling, neck pain and neck stiffness.  Skin: Negative.   Neurological: Negative.   Psychiatric/Behavioral: Negative.     Per HPI unless specifically indicated above     Objective:    BP 94/66 (BP Location: Left Arm, Patient  Position: Sitting, Cuff Size: Large)   Pulse 98   Temp 97.9 F (36.6 C) (Oral)   Wt 270 lb (122.5 kg)   SpO2 95%   BMI 34.67 kg/m   Wt Readings from Last 3 Encounters:  03/20/20 270 lb (122.5 kg)  03/03/20 268 lb (121.6 kg)  02/21/20 264 lb 9.6 oz (120 kg)    Physical Exam Vitals and nursing note reviewed.  Constitutional:      General: He is not in acute distress.    Appearance: Normal appearance. He is not ill-appearing, toxic-appearing or diaphoretic.  HENT:     Head: Normocephalic and atraumatic.     Right Ear: External ear normal.     Left Ear: External ear normal.     Nose: Nose normal.     Mouth/Throat:     Mouth: Mucous membranes are moist.     Pharynx: Oropharynx is clear.  Eyes:     General: No scleral icterus.       Right eye: No discharge.        Left eye: No discharge.     Extraocular Movements: Extraocular movements intact.     Conjunctiva/sclera: Conjunctivae normal.     Pupils: Pupils are equal, round, and reactive to light.  Cardiovascular:     Rate and Rhythm: Normal rate and regular rhythm.     Pulses: Normal pulses.     Heart sounds: Normal heart sounds. No murmur. No friction rub. No gallop.   Pulmonary:     Effort: Pulmonary effort is normal. No  respiratory distress.     Breath sounds: Normal breath sounds. No stridor. No wheezing, rhonchi or rales.  Chest:     Chest wall: No tenderness.  Musculoskeletal:        General: Normal range of motion.     Cervical back: Normal range of motion and neck supple.  Skin:    General: Skin is warm and dry.     Capillary Refill: Capillary refill takes less than 2 seconds.     Coloration: Skin is not jaundiced or pale.     Findings: No bruising, erythema, lesion or rash.  Neurological:     General: No focal deficit present.     Mental Status: He is alert and oriented to person, place, and time. Mental status is at baseline.  Psychiatric:        Mood and Affect: Mood normal.        Behavior: Behavior  normal.        Thought Content: Thought content normal.        Judgment: Judgment normal.     Results for orders placed or performed in visit on 01/30/20  D-dimer, quantitative (not at [facility])  Result Value Ref Range   D-Dimer, Quant 1.07 (H) <0.50 mcg/mL FEU  Sedimentation rate  Result Value Ref Range   Sed Rate 45 (H) 0 - 20 mm/hr  CBC with Differential/Platelet  Result Value Ref Range   WBC 11.3 (H) 4.0 - 10.5 K/uL   RBC 5.44 4.22 - 5.81 Mil/uL   Hemoglobin 15.6 13.0 - 17.0 g/dL   HCT 45.8 09.9 - 83.3 %   MCV 85.1 78.0 - 100.0 fl   MCHC 33.8 30.0 - 36.0 g/dL   RDW 82.5 05.3 - 97.6 %   Platelets 269.0 150.0 - 400.0 K/uL   Neutrophils Relative % 64.8 43.0 - 77.0 %   Lymphocytes Relative 23.3 12.0 - 46.0 %   Monocytes Relative 6.9 3.0 - 12.0 %   Eosinophils Relative 4.0 0.0 - 5.0 %   Basophils Relative 1.0 0.0 - 3.0 %   Neutro Abs 7.3 1.4 - 7.7 K/uL   Lymphs Abs 2.6 0.7 - 4.0 K/uL   Monocytes Absolute 0.8 0.1 - 1.0 K/uL   Eosinophils Absolute 0.5 0.0 - 0.7 K/uL   Basophils Absolute 0.1 0.0 - 0.1 K/uL  Brain natriuretic peptide  Result Value Ref Range   Pro B Natriuretic peptide (BNP) 19.0 0.0 - 100.0 pg/mL  Basic metabolic panel  Result Value Ref Range   Sodium 134 (L) 135 - 145 mEq/L   Potassium 3.7 3.5 - 5.1 mEq/L   Chloride 100 96 - 112 mEq/L   CO2 22 19 - 32 mEq/L   Glucose, Bld 159 (H) 70 - 99 mg/dL   BUN 21 6 - 23 mg/dL   Creatinine, Ser 7.34 0.40 - 1.50 mg/dL   GFR 19.37 >90.24 mL/min   Calcium 9.6 8.4 - 10.5 mg/dL  Troponin I (High Sensitivity)  Result Value Ref Range   High Sens Troponin I 6 2 - 17 ng/L      Assessment & Plan:   Problem List Items Addressed This Visit      Other   History of 2019 novel coronavirus disease (COVID-19) - Primary    Continues to recover. Still very tired. Will hold on increasing going back to work. Will keep him at 3 days a week and recheck 1 month. Call with any concerns.        Other Visit Diagnoses  Hypotension  due to drugs       Will cut his amlodipine in 1/2 and recheck 1 month. Call with any concerns.    Relevant Medications   amLODipine (NORVASC) 5 MG tablet   Acute pain of both knees       Discused referral to ortho. He would like to hold for now. Conitnue to monitor. Call with any concerns.        Follow up plan: Return in about 4 weeks (around 04/17/2020).

## 2020-03-20 NOTE — Assessment & Plan Note (Signed)
Continues to recover. Still very tired. Will hold on increasing going back to work. Will keep him at 3 days a week and recheck 1 month. Call with any concerns.

## 2020-04-01 ENCOUNTER — Ambulatory Visit: Payer: BC Managed Care – PPO | Admitting: Pharmacist

## 2020-04-01 DIAGNOSIS — E785 Hyperlipidemia, unspecified: Secondary | ICD-10-CM

## 2020-04-01 DIAGNOSIS — E1142 Type 2 diabetes mellitus with diabetic polyneuropathy: Secondary | ICD-10-CM

## 2020-04-01 DIAGNOSIS — I152 Hypertension secondary to endocrine disorders: Secondary | ICD-10-CM

## 2020-04-01 DIAGNOSIS — I10 Essential (primary) hypertension: Secondary | ICD-10-CM

## 2020-04-01 DIAGNOSIS — E1169 Type 2 diabetes mellitus with other specified complication: Secondary | ICD-10-CM

## 2020-04-01 DIAGNOSIS — M170 Bilateral primary osteoarthritis of knee: Secondary | ICD-10-CM

## 2020-04-01 DIAGNOSIS — E1159 Type 2 diabetes mellitus with other circulatory complications: Secondary | ICD-10-CM

## 2020-04-01 DIAGNOSIS — E119 Type 2 diabetes mellitus without complications: Secondary | ICD-10-CM

## 2020-04-01 NOTE — Chronic Care Management (AMB) (Signed)
Chronic Care Management   Follow Up Note   04/01/2020 Name: Casey Hunter. MRN: [id number] DOB: 08-04-1956  Referred by: Valerie Roys, DO Reason for referral : Chronic Care Management (Medication Management)   Casey Hunter. is a 64 y.o. year old male who is a primary care patient of Trino, Higinbotham, DO. The CCM team was consulted for assistance with chronic disease management and care coordination needs.    Contacted patient for medication management review.   Review of patient status, including review of consultants reports, relevant laboratory and other test results, and collaboration with appropriate care team members and the patient's provider was performed as part of comprehensive patient evaluation and provision of chronic care management services.    SDOH (Social Determinants of Health) assessments performed: Yes See Care Plan activities for detailed interventions related to [facility])     Outpatient Encounter Medications as of 04/01/2020  Medication Sig Note  . acetaminophen (TYLENOL) 650 MG CR tablet Take 1,300 mg by mouth in the morning and at bedtime.   Marland Kitchen amLODipine (NORVASC) 5 MG tablet Take 1 tablet (5 mg total) by mouth daily. 04/01/2020: Still taking amlodipine 10 mg   . aspirin 81 MG tablet Take 81 mg by mouth daily.   . colesevelam (WELCHOL) 625 MG tablet Take 3 tablets (1,875 mg total) by mouth 2 (two) times daily with a meal. Takes 3 tables twice daily   . Continuous Blood Gluc Receiver (FREESTYLE LIBRE 14 DAY READER) DEVI USE AS DIRECTED   . Continuous Blood Gluc Sensor (FREESTYLE LIBRE 14 DAY SENSOR) MISC Inject 1 each into the skin See admin instructions.   . cyclobenzaprine (FLEXERIL) 10 MG tablet Take 1 tablet (10 mg total) by mouth at bedtime.   . dapagliflozin propanediol (FARXIGA) 10 MG TABS tablet Take 10 mg by mouth daily.   . Dulaglutide (TRULICITY) 3 MA/2.6JF SOPN Inject 3 mg into the skin once a week.   . ezetimibe (ZETIA) 10 MG tablet Take 1  tablet (10 mg total) by mouth daily.   Marland Kitchen lovastatin (MEVACOR) 20 MG tablet Take 1 tablet (20 mg total) by mouth at bedtime.   . meloxicam (MOBIC) 15 MG tablet Take 1 tablet (15 mg total) by mouth daily.   . metFORMIN (GLUCOPHAGE) 1000 MG tablet Take 1 tablet (1,000 mg total) by mouth 2 (two) times daily with a meal.   . niacin (NIASPAN) 1000 MG CR tablet Take 1 tablet (1,000 mg total) by mouth at bedtime.   Marland Kitchen olmesartan-hydrochlorothiazide (BENICAR HCT) 40-25 MG tablet TAKE 1 TABLET DAILY.   Marland Kitchen pregabalin (LYRICA) 50 MG capsule Take 1 capsule (50 mg total) by mouth 3 (three) times daily.   . sildenafil (REVATIO) 20 MG tablet Take 1 tablet (20 mg total) by mouth as needed. 1-5 prn    No facility-administered encounter medications on file as of 04/01/2020.     Objective:   Goals Addressed            This Visit's Progress     Patient Stated   . PharmD "I just take so many medications" (pt-stated)       CARE PLAN ENTRY (see longtitudinal plan of care for additional care plan information)  Current Barriers:  . Diabetes: uncontrolled, complicated by chronic medical conditions including HTN, HLD, OA, psoriasis, s/p COVID, most recent A1c 9.2% o Reports that he wasn't sure where the lower dose of amlodipine was called in, so he hasn't been taking it - has continued  to take amlodipine 10 mg daily. Reports home readings remain well controlled - SBP 120-130. Unsure why his BP at clinic last time was so low o Continues to report little energy, fatigue, and "brain fog". Frustrated by how slow his recovery process has been. 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 Most recent eGFR: >90 mL/min . Current antihyperglycemic regimen: metformin 5409 mg BID, Trulicity 3 mg weekly, Farxiga 10 mg daily . Current blood glucose readings: Utilizing FreeStyle Libre CGM Average Glucose: 14 day = 162 Time in Goal (though he has set  goal at 80-130) - Time in range 70-180: 87% - Time above range: 12% - Time below range: 1% Per his report: Bedtime readings 130-140, Fast[place] 160s . Cardiovascular risk reduction: o Current hypertensive regimen: Amlodipine 10 mg daily (reduced to 5 mg at last office appointment, but he has not been doing that); olmesartan/HCTZ 40/25 mg daily;  o Current hyperlipidemia regimen: Welchol 1875 mg TID, lovastatin 20 mg daily, ezetimibe 10 mg daily, niacin 1000 mg daily; last LDL not at goal, was 131, TG controlled at 130  - Hx severe muscle pain w/ rosuvastatin 10 mg twice weekly, atorvastatin, dose unknown - Notes that he doesn't like taking niacin because of flushing. Denies hx using fenofibrate o Current antiplatelet regimen: ASA 81 mg  . Osteoarthritis: meloxicam 15 mg daily, pregabalin 50 mg TID, Cyclobenzaprine 10 mg daily, reports continued issues w/ pain in his feet and occasionally moving up his left leg, describes as a sensation of his foot falling asleep. Also notes arthritis pain in his knees. Wonders what can be done about that.   Pharmacist Clinical Goal(s):  Marland Kitchen Over the next 90 days, patient will work with PharmD and primary care provider to address optimized medication management  Interventions: . Comprehensive medication review performed, medication list updated in electronic medical record . Inter-disciplinary care team collaboration (see longitudinal plan of care) . Encouraged to continue to communicate concerns about fatigue w/ care providers. Denies any LEE or orthopnea that would possibly indicate cardiac complications from COVID.  Marland Kitchen Discussed to adjust CGM goal to 80-180, as 80-130 is our goal for fasting, but <180 is our goal for post meals. He will do this. He will look at his email and try to sign up to submit readings remotely through [facility] for my viewing. Either way, average ~160 corresponds to A1c  7-7.5%. May need further escalation of Trulicity therapy.  . Patient  reports normal BP readings on home meter w/ amlodipine 10 mg daily. Denies s/sx hypotension. Encouraged to bring home meter to clinic for comparison with next appointment, but to continue amlodipine 10 mg daily for now, and discuss w/ Dr. Wynetta Emery. Other recent clinic readings were more similar to home readings in the SBPs 120s range . Reviewed current reports of arthritis and neuropathic pain. Encouraged to discuss pros and cons of pregabalin dose increase to 75 mg TID w/ Dr. Wynetta Emery. Would not want to cause additional sedation, given current brain fog reports, but patient reports that neuropathic pain is constant. Also counseled on max APAP dosing of 4 g daily - he reports he had previously been taking APAP 1300 mg QAM and QPM, but was afraid this was too much. Encouraged to restart this regimen, knowing he could take another dose in the middle of the day as needed (max 6 APAP 650 mg tabs daily). He verbalized understanding.   Patient Self Care  Activities:  . Patient will check blood glucose Q6H , document, and provide at future appointments . Patient will take medications as prescribed . Patient will report any questions or concerns to provider   Please see past updates related to this goal by clicking on the "Past Updates" button in the selected goal          Plan:  - Scheduled f/u call in ~ 8 weeks (~ 4 weeks after upcoming PCP visit)  Catie Darnelle Maffucci, PharmD, [place] [phone]

## 2020-04-01 NOTE — Patient Instructions (Signed)
Visit Information  Goals Addressed            This Visit's Progress     Patient Stated   . PharmD "I just take so many medications" (pt-stated)       CARE PLAN ENTRY (see longtitudinal plan of care for additional care plan information)  Current Barriers:  . Diabetes: uncontrolled, complicated by chronic medical conditions including HTN, HLD, OA, psoriasis, s/p COVID, most recent A1c 9.2% o Reports that he wasn't sure where the lower dose of amlodipine was called in, so he hasn't been taking it - has continued to take amlodipine 10 mg daily. Reports home readings remain well controlled - SBP 120-130. Unsure why his BP at clinic last time was so low o Continues to report little energy, fatigue, and "brain fog". Frustrated by how slow his recovery process has been. 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 Most recent eGFR: >90 mL/min . Current antihyperglycemic regimen: metformin 3329 mg BID, Trulicity 3 mg weekly, Farxiga 10 mg daily . Current blood glucose readings: Utilizing FreeStyle Libre CGM Average Glucose: 14 day = 162 Time in Goal (though he has set goal at 80-130) - Time in range 70-180: 87% - Time above range: 12% - Time below range: 1% Per his report: Bedtime readings 130-140, Fastings 160s . Cardiovascular risk reduction: o Current hypertensive regimen: Amlodipine 10 mg daily (reduced to 5 mg at last office appointment, but he has not been doing that); olmesartan/HCTZ 40/25 mg daily;  o Current hyperlipidemia regimen: Welchol 1875 mg TID, lovastatin 20 mg daily, ezetimibe 10 mg daily, niacin 1000 mg daily; last LDL not at goal, was 131, TG controlled at 130  - Hx severe muscle pain w/ rosuvastatin 10 mg twice weekly, atorvastatin, dose unknown - Notes that he doesn't like taking niacin because of flushing. Denies hx using fenofibrate o Current antiplatelet regimen: ASA 81 mg  . Osteoarthritis:  meloxicam 15 mg daily, pregabalin 50 mg TID, Cyclobenzaprine 10 mg daily, reports continued issues w/ pain in his feet and occasionally moving up his left leg, describes as a sensation of his foot falling asleep. Also notes arthritis pain in his knees. Wonders what can be done about that.   Pharmacist Clinical Goal(s):  Marland Kitchen Over the next 90 days, patient will work with PharmD and primary care provider to address optimized medication management  Interventions: . Comprehensive medication review performed, medication list updated in electronic medical record . Inter-disciplinary care team collaboration (see longitudinal plan of care) . Encouraged to continue to communicate concerns about fatigue w/ care providers. Denies any LEE or orthopnea that would possibly indicate cardiac complications from COVID.  Marland Kitchen Discussed to adjust CGM goal to 80-180, as 80-130 is our goal for fasting, but <180 is our goal for post meals. He will do this. He will look at his email and try to sign up to submit readings remotely through [facility] for my viewing. Either way, average ~160 corresponds to A1c  7-7.5%. May need further escalation of Trulicity therapy.  . Patient reports normal BP readings on home meter w/ amlodipine 10 mg daily. Denies s/sx hypotension. Encouraged to bring home meter to clinic for comparison with next appointment, but to continue amlodipine 10 mg daily for now, and discuss w/ Dr. Wynetta Emery. Other recent clinic readings were more similar to home readings in the SBPs 120s range . Reviewed current reports of arthritis and neuropathic pain. Encouraged  to discuss pros and cons of pregabalin dose increase to 75 mg TID w/ Dr. Wynetta Emery. Would not want to cause additional sedation, given current brain fog reports, but patient reports that neuropathic pain is constant. Also counseled on max APAP dosing of 4 g daily - he reports he had previously been taking APAP 1300 mg QAM and QPM, but was afraid this was too much.  Encouraged to restart this regimen, knowing he could take another dose in the middle of the day as needed (max 6 APAP 650 mg tabs daily). He verbalized understanding.   Patient Self Care Activities:  . Patient will check blood glucose Q6H , document, and provide at future appointments . Patient will take medications as prescribed . Patient will report any questions or concerns to provider   Please see past updates related to this goal by clicking on the "Past Updates" button in the selected goal         Patient verbalizes understanding of instructions provided today.   Plan:  - Scheduled f/u call in ~ 8 weeks (~ 4 weeks after upcoming PCP visit)  Catie Darnelle Maffucci, PharmD, [place] [phone]

## 2020-04-17 ENCOUNTER — Encounter: Payer: Self-pay | Admitting: Family Medicine

## 2020-04-17 ENCOUNTER — Other Ambulatory Visit: Payer: Self-pay

## 2020-04-17 ENCOUNTER — Ambulatory Visit (INDEPENDENT_AMBULATORY_CARE_PROVIDER_SITE_OTHER): Payer: BC Managed Care – PPO | Admitting: Family Medicine

## 2020-04-17 VITALS — BP 133/79 | HR 93 | Temp 98.1°F | Ht <= 58 in | Wt 270.0 lb

## 2020-04-17 DIAGNOSIS — I10 Essential (primary) hypertension: Secondary | ICD-10-CM | POA: Diagnosis not present

## 2020-04-17 DIAGNOSIS — E1142 Type 2 diabetes mellitus with diabetic polyneuropathy: Secondary | ICD-10-CM | POA: Diagnosis not present

## 2020-04-17 DIAGNOSIS — E1159 Type 2 diabetes mellitus with other circulatory complications: Secondary | ICD-10-CM

## 2020-04-17 DIAGNOSIS — Z8616 Personal history of COVID-19: Secondary | ICD-10-CM | POA: Diagnosis not present

## 2020-04-17 DIAGNOSIS — I152 Hypertension secondary to endocrine disorders: Secondary | ICD-10-CM

## 2020-04-17 LAB — BAYER DCA HB A1C WAIVED: HB A1C (BAYER DCA - WAIVED): 7.9 % — ABNORMAL HIGH (ref <7.0)

## 2020-04-17 NOTE — Progress Notes (Signed)
BP 133/79 (BP Location: Left Arm, Patient Position: Sitting, Cuff Size: Large)   Pulse 93   Temp 98.1 F (36.7 C) (Oral)   Ht (!) 6" (0.152 m)   Wt 270 lb (122.5 kg)   SpO2 95%   BMI 5273.08 kg/m    Subjective:    Patient ID: Casey Greenspan., male    DOB: 1956/09/07, 64 y.o.   MRN: [id number]  HPI: Casey Chausse. is a 64 y.o. male  Chief Complaint  Patient presents with  . corona virus  . Diabetes  . Hypertension   Got his 2nd COVID shot last week and felt terrible. The heat is really bothering him. He has been getting dizzy and light headed. He notes that he is having problems with being active and doing his job because of that. Has been doing well with the 3 days of work besides the heat and would like to stay there for now. He's afraid that he's going to have an accident or pass out with the heat and how he's feeling.   DIABETES Hypoglycemic episodes:no Polydipsia/polyuria: yes Visual disturbance: no Chest pain: no Paresthesias: yes Glucose Monitoring: yes  Accucheck frequency: Daily Taking Insulin?: no Blood Pressure Monitoring: not checking Retinal Examination: Not up to Date Foot Exam: Up to Date Diabetic Education: Completed Pneumovax: Up to Date Influenza: Up to Date Aspirin: yes   HYPERTENSION Hypertension status: controlled  Satisfied with current treatment? yes Duration of hypertension: chronic BP monitoring frequency:  not checking BP medication side effects:  no Medication compliance: excellent compliance Aspirin: yes Recurrent headaches: no Visual changes: no Palpitations: no Dyspnea: no Chest pain: no Lower extremity edema: no Dizzy/lightheaded: yes   Relevant past medical, surgical, family and social history reviewed and updated as indicated. Interim medical history since our last visit reviewed. Allergies and medications reviewed and updated.  Review of Systems  Constitutional: Negative.   Respiratory: Negative.     Cardiovascular: Negative.   Gastrointestinal: Negative.   Musculoskeletal: Positive for arthralgias and myalgias. Negative for back pain, gait problem, joint swelling, neck pain and neck stiffness.  Skin: Negative.   Psychiatric/Behavioral: Positive for confusion and decreased concentration. Negative for agitation, behavioral problems, dysphoric mood, hallucinations, self-injury, sleep disturbance and suicidal ideas. The patient is not nervous/anxious and is not hyperactive.     Per HPI unless specifically indicated above     Objective:    BP 133/79 (BP Location: Left Arm, Patient Position: Sitting, Cuff Size: Large)   Pulse 93   Temp 98.1 F (36.7 C) (Oral)   Ht (!) 6" (0.152 m)   Wt 270 lb (122.5 kg)   SpO2 95%   BMI 5273.08 kg/m   Wt Readings from Last 3 Encounters:  04/17/20 270 lb (122.5 kg)  03/20/20 270 lb (122.5 kg)  03/03/20 268 lb (121.6 kg)    Physical Exam Vitals and nursing note reviewed.  Constitutional:      General: He is not in acute distress.    Appearance: Normal appearance. He is not ill-appearing, toxic-appearing or diaphoretic.  HENT:     Head: Normocephalic and atraumatic.     Right Ear: External ear normal.     Left Ear: External ear normal.     Nose: Nose normal.     Mouth/Throat:     Mouth: Mucous membranes are moist.     Pharynx: Oropharynx is clear.  Eyes:     General: No scleral icterus.       Right eye:  No discharge.        Left eye: No discharge.     Extraocular Movements: Extraocular movements intact.     Conjunctiva/sclera: Conjunctivae normal.     Pupils: Pupils are equal, round, and reactive to light.  Cardiovascular:     Rate and Rhythm: Normal rate and regular rhythm.     Pulses: Normal pulses.     Heart sounds: Normal heart sounds. No murmur. No friction rub. No gallop.   Pulmonary:     Effort: Pulmonary effort is normal. No respiratory distress.     Breath sounds: Normal breath sounds. No stridor. No wheezing, rhonchi or  rales.  Chest:     Chest wall: No tenderness.  Musculoskeletal:        General: Normal range of motion.     Cervical back: Normal range of motion and neck supple.  Skin:    General: Skin is warm and dry.     Capillary Refill: Capillary refill takes less than 2 seconds.     Coloration: Skin is not jaundiced or pale.     Findings: No bruising, erythema, lesion or rash.  Neurological:     General: No focal deficit present.     Mental Status: He is alert and oriented to person, place, and time. Mental status is at baseline.  Psychiatric:        Mood and Affect: Mood normal.        Behavior: Behavior normal.        Thought Content: Thought content normal.        Judgment: Judgment normal.     Results for orders placed or performed in visit on 04/17/20  Bayer DCA Hb A1c Waived  Result Value Ref Range   HB A1C (BAYER DCA - WAIVED) 7.9 (H) <5.2 %  Basic metabolic panel  Result Value Ref Range   Glucose 115 (H) 65 - 99 mg/dL   BUN 18 8 - 27 mg/dL   Creatinine, Ser 0.79 0.76 - 1.27 mg/dL   GFR calc non Af Amer 95 >59 mL/min/1.73   GFR calc Af Amer 110 >59 mL/min/1.73   BUN/Creatinine Ratio 23 10 - 24   Sodium 135 134 - 144 mmol/L   Potassium 4.3 3.5 - 5.2 mmol/L   Chloride 102 96 - 106 mmol/L   CO2 19 (L) 20 - 29 mmol/L   Calcium 9.1 8.6 - 10.2 mg/dL      Assessment & Plan:   Problem List Items Addressed This Visit      Cardiovascular and Mediastinum   Hypertension associated with type 2 diabetes mellitus ([place])    Under good control on current regimen. Continue current regimen. Continue to monitor. Call with any concerns. Refills given. Labs drawn today.         Endocrine   Diabetic peripheral neuropathy associated with type 2 diabetes mellitus ([place]) - Primary    Doing much better with A1c of 7.9 down from 9.2. Will continue current regimen and continue to monitor. Work on diet and exercise. If still >7 next visit, will increase truliticy. Refills given. Call with any  concerns.       Relevant Orders   Bayer DCA Hb A1c Waived (Completed)   Basic metabolic panel (Completed)     Other   History of 2019 novel coronavirus disease (COVID-19)    Continues with long-haul symptoms. We will get him into the post-covid clinic to see if there is anything they can do to help. Will continue him with  3 days a week at work and recheck 1 month to see if that can be increased. Call with any concerns. Continue to monitor.           Follow up plan: Return in about 4 weeks (around 05/15/2020).

## 2020-04-18 ENCOUNTER — Encounter: Payer: Self-pay | Admitting: Family Medicine

## 2020-04-18 LAB — BASIC METABOLIC PANEL
BUN/Creatinine Ratio: 23 (ref 10–24)
BUN: 18 mg/dL (ref 8–27)
CO2: 19 mmol/L — ABNORMAL LOW (ref 20–29)
Calcium: 9.1 mg/dL (ref 8.6–10.2)
Chloride: 102 mmol/L (ref 96–106)
Creatinine, Ser: 0.79 mg/dL (ref 0.76–1.27)
GFR calc Af Amer: 110 mL/min/{1.73_m2} (ref >59)
GFR calc non Af Amer: 95 mL/min/{1.73_m2} (ref >59)
Glucose: 115 mg/dL — ABNORMAL HIGH (ref 65–99)
Potassium: 4.3 mmol/L (ref 3.5–5.2)
Sodium: 135 mmol/L (ref 134–144)

## 2020-04-18 NOTE — Assessment & Plan Note (Signed)
Doing much better with A1c of 7.9 down from 9.2. Will continue current regimen and continue to monitor. Work on diet and exercise. If still >7 next visit, will increase truliticy. Refills given. Call with any concerns.

## 2020-04-18 NOTE — Assessment & Plan Note (Signed)
Continues with long-haul symptoms. We will get him into the post-covid clinic to see if there is anything they can do to help. Will continue him with 3 days a week at work and recheck 1 month to see if that can be increased. Call with any concerns. Continue to monitor.

## 2020-04-18 NOTE — Assessment & Plan Note (Signed)
Under good control on current regimen. Continue current regimen. Continue to monitor. Call with any concerns. Refills given. Labs drawn today.   

## 2020-04-24 ENCOUNTER — Other Ambulatory Visit: Payer: Self-pay

## 2020-04-24 ENCOUNTER — Ambulatory Visit (INDEPENDENT_AMBULATORY_CARE_PROVIDER_SITE_OTHER): Payer: BC Managed Care – PPO | Admitting: Nurse Practitioner

## 2020-04-24 ENCOUNTER — Encounter: Payer: Self-pay | Admitting: Psychology

## 2020-04-24 VITALS — BP 114/74 | HR 90 | Temp 97.7°F | Ht 74.0 in | Wt 280.0 lb

## 2020-04-24 DIAGNOSIS — R5383 Other fatigue: Secondary | ICD-10-CM | POA: Diagnosis not present

## 2020-04-24 DIAGNOSIS — R413 Other amnesia: Secondary | ICD-10-CM

## 2020-04-24 DIAGNOSIS — R200 Anesthesia of skin: Secondary | ICD-10-CM

## 2020-04-24 DIAGNOSIS — M2550 Pain in unspecified joint: Secondary | ICD-10-CM | POA: Diagnosis not present

## 2020-04-24 DIAGNOSIS — R42 Dizziness and giddiness: Secondary | ICD-10-CM

## 2020-04-24 DIAGNOSIS — Z8616 Personal history of COVID-19: Secondary | ICD-10-CM | POA: Diagnosis not present

## 2020-04-24 DIAGNOSIS — R5381 Other malaise: Secondary | ICD-10-CM

## 2020-04-24 MED ORDER — MECLIZINE HCL 12.5 MG PO TABS: 12.5000 mg | ORAL_TABLET | Freq: Three times a day (TID) | ORAL | 0 refills | Status: DC | PRN

## 2020-04-24 NOTE — Patient Instructions (Addendum)
Memory Loss Brain Fog Numbness to lower extremities Dizziness:  Will refer to neuropsychology  Hand out given on brain fog and memory loss   Multiple joint pain Fatigue:  Will refer for physical therapy  Will check labs - concerned for autoimmune disorder   Vertigo:  Stay well hydrated  Will order meclizine as needed  Follow up:  Follow up in 2 months or sooner if needed

## 2020-04-24 NOTE — Progress Notes (Signed)
@Patient  ID: Casey Spiro., male    DOB: 02/03/1956, 64 y.o.   MRN: [id number]  Chief Complaint  Patient presents with  . Post COVID    Tested positive 11/2019. Brain fog, confusion, cold sweats  . Joint Pain    knees, wrist and shoulders  . Dizziness  . Fatigue    Works 3 days a week.     Referring provider: Valerie Roys, DO   64 year old male with history of hypertension, diabetes, hyperlipidemia, psoriasis, sleep apnea.  Patient was diagnosed with Covid in January 2021.  HPI  Patient presents today for post Covid care clinic visit.  Patient was diagnosed with Covid in January and did receive monoclonal antibody infusion.  He describes his symptoms as severe at that time.  He was never hospitalized but states that he probably should have went to the hospital.  Patient continues to have ongoing symptoms.  Today he complains of multiple joint pain, numbness and tingling to lower extremities, dizziness, memory loss, fatigue.  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  He has reduced his work schedule to 3 days a week.  He is concerned about safety on the job site due to his memory loss.  He has to check over his work 3 or 4 times to make sure he did everything correctly.  Patient states that he went to pick up his grandson at his house the other day and got lost.  When he realized what he was doing and he was over 3 miles away from where he was supposed to be.  Patient states that he did not have any memory issues before getting Covid.  Patient also states that a good portion of his long-term memory is gone.  For example - he cannot remember places he has been in the past or vacations.  Patient denies any significant shortness of breath or cough.  Patient is following up regularly with PCP for hypertension and diabetes.Denies f/c/s, n/v/d, hemoptysis, PND, chest pain or edema.     Allergies  Allergen Reactions  . Crestor [Rosuvastatin Calcium]     Myalgias  .  Lipitor [Atorvastatin]     Myalgia  . Penicillin G Potassium [Penicillin G]   . Sulfur     Immunization History  Administered Date(s) Administered  . Influenza,inj,Quad PF,6+ Mos 09/17/2015, 09/21/2017  . Influenza-Unspecified 10/09/2016, 09/17/2018, 09/23/2019  . Moderna SARS-COVID-2 Vaccination 03/13/2020, 04/10/2020  . Pneumococcal Polysaccharide-23 12/05/2006  . Td 05/13/2005  . Tdap 01/08/2016    Past Medical History:  Diagnosis Date  . Hyperlipidemia   . Hypertension   . Sleep apnea     Tobacco History: Social History   Tobacco Use  Smoking Status Never Smoker  Smokeless Tobacco Never Used   Counseling given: Not Answered   Outpatient Encounter Medications as of 04/24/2020  Medication Sig  . acetaminophen (TYLENOL) 650 MG CR tablet Take 1,300 mg by mouth in the morning and at bedtime.  Marland Kitchen amLODipine (NORVASC) 5 MG tablet Take 1 tablet (5 mg total) by mouth daily.  Marland Kitchen aspirin 81 MG tablet Take 81 mg by mouth daily.  . colesevelam (WELCHOL) 625 MG tablet Take 3 tablets (1,875 mg total) by mouth 2 (two) times daily with a meal. Takes 3 tables twice daily  . Continuous Blood Gluc Receiver (FREESTYLE LIBRE 14 DAY READER) DEVI USE AS DIRECTED  . Continuous Blood Gluc Sensor (FREESTYLE LIBRE 14 DAY SENSOR) MISC Inject 1 each into the skin See admin instructions.  Marland Kitchen  cyclobenzaprine (FLEXERIL) 10 MG tablet Take 1 tablet (10 mg total) by mouth at bedtime.  . dapagliflozin propanediol (FARXIGA) 10 MG TABS tablet Take 10 mg by mouth daily.  . Dulaglutide (TRULICITY) 3 MG/0.5ML SOPN Inject 3 mg into the skin once a week.  . ezetimibe (ZETIA) 10 MG tablet Take 1 tablet (10 mg total) by mouth daily.  Marland Kitchen lovastatin (MEVACOR) 20 MG tablet Take 1 tablet (20 mg total) by mouth at bedtime.  . meloxicam (MOBIC) 15 MG tablet Take 1 tablet (15 mg total) by mouth daily.  . metFORMIN (GLUCOPHAGE) 1000 MG tablet Take 1 tablet (1,000 mg total) by mouth 2 (two) times daily with a meal.  . niacin  (NIASPAN) 1000 MG CR tablet Take 1 tablet (1,000 mg total) by mouth at bedtime.  Marland Kitchen olmesartan-hydrochlorothiazide (BENICAR HCT) 40-25 MG tablet TAKE 1 TABLET DAILY.  Marland Kitchen pregabalin (LYRICA) 50 MG capsule Take 1 capsule (50 mg total) by mouth 3 (three) times daily.  . sildenafil (REVATIO) 20 MG tablet Take 1 tablet (20 mg total) by mouth as needed. 1-5 prn  . meclizine (ANTIVERT) 12.5 MG tablet Take 1 tablet (12.5 mg total) by mouth 3 (three) times daily as needed for dizziness.   No facility-administered encounter medications on file as of 04/24/2020.     Review of Systems  Review of Systems  Constitutional: Negative.  Negative for chills and fever.  HENT: Negative.   Respiratory: Negative for cough, shortness of breath and wheezing.   Cardiovascular: Negative.  Negative for chest pain, palpitations and leg swelling.  Gastrointestinal: Negative.   Musculoskeletal: Positive for arthralgias.  Allergic/Immunologic: Negative.   Neurological: Positive for dizziness and numbness (lower extremities).  Psychiatric/Behavioral: Positive for decreased concentration.       Short and long term memory loss       Physical Exam  BP 114/74 (BP Location: Left Arm, Patient Position: Sitting, Cuff Size: Large)   Pulse 90   Temp 97.7 F (36.5 C)   Ht 6\' 2"  (1.88 m)   Wt 280 lb (127 kg)   SpO2 95%   BMI 35.95 kg/m   Wt Readings from Last 5 Encounters:  04/24/20 280 lb (127 kg)  04/17/20 270 lb (122.5 kg)  03/20/20 270 lb (122.5 kg)  03/03/20 268 lb (121.6 kg)  02/21/20 264 lb 9.6 oz (120 kg)     Physical Exam Vitals and nursing note reviewed.  Constitutional:      General: He is not in acute distress.    Appearance: He is well-developed.  Cardiovascular:     Rate and Rhythm: Normal rate and regular rhythm.  Pulmonary:     Effort: Pulmonary effort is normal.     Breath sounds: Normal breath sounds.  Skin:    General: Skin is warm and dry.  Neurological:     Mental Status: He is  alert and oriented to person, place, and time.     Comments: Equal grips and strength bilateral. Slow to answer questions during exam.   Psychiatric:        Mood and Affect: Mood normal.        Behavior: Behavior normal.      Lab Results:  CBC    Component Value Date/Time   WBC 11.3 (H) 01/30/2020 1111   RBC 5.44 01/30/2020 1111   HGB 15.6 01/30/2020 1111   HGB 16.3 01/18/2020 0822   HCT 46.3 01/30/2020 1111   HCT 49.2 01/18/2020 0822   PLT 269.0 01/30/2020 1111   PLT  298 01/18/2020 0822   MCV 85.1 01/30/2020 1111   MCV 86 01/18/2020 0822   MCV 84 11/22/2012 0903   MCH 28.5 01/18/2020 0822   MCH 28.3 11/22/2012 0903   MCHC 33.8 01/30/2020 1111   RDW 14.8 01/30/2020 1111   RDW 14.0 01/18/2020 0822   RDW 13.6 11/22/2012 0903   LYMPHSABS 2.6 01/30/2020 1111   LYMPHSABS 2.0 01/18/2020 0822   MONOABS 0.8 01/30/2020 1111   EOSABS 0.5 01/30/2020 1111   EOSABS 0.2 01/18/2020 0822   BASOSABS 0.1 01/30/2020 1111   BASOSABS 0.1 01/18/2020 0822    BMET    Component Value Date/Time   NA 135 04/17/2020 1713   NA 139 11/22/2012 0903   K 4.3 04/17/2020 1713   K 3.8 11/22/2012 0903   CL 102 04/17/2020 1713   CL 106 11/22/2012 0903   CO2 19 (L) 04/17/2020 1713   CO2 24 11/22/2012 0903   GLUCOSE 115 (H) 04/17/2020 1713   GLUCOSE 159 (H) 01/30/2020 1111   GLUCOSE 151 (H) 11/22/2012 0903   BUN 18 04/17/2020 1713   BUN 17 11/22/2012 0903   CREATININE 0.79 04/17/2020 1713   CREATININE 0.94 11/22/2012 0903   CALCIUM 9.1 04/17/2020 1713   CALCIUM 9.2 11/22/2012 0903   GFRNONAA 95 04/17/2020 1713   GFRNONAA >60 11/22/2012 0903   GFRAA 110 04/17/2020 1713   GFRAA >60 11/22/2012 0903     Assessment & Plan:   History of COVID-19 Memory Loss Brain Fog Numbness to lower extremities Dizziness:  Will refer to neuropsychology  Hand out given on brain fog and memory loss   Multiple joint pain Fatigue:  Will refer for physical therapy  Will check labs - concerned for  autoimmune disorder   Vertigo:  Stay well hydrated  Will order meclizine as needed  Follow up:  Follow up in 2 months or sooner if needed       Casey Andrew, NP 04/25/2020

## 2020-04-25 ENCOUNTER — Encounter: Payer: Self-pay | Admitting: Physical Medicine and Rehabilitation

## 2020-04-25 DIAGNOSIS — R42 Dizziness and giddiness: Secondary | ICD-10-CM | POA: Insufficient documentation

## 2020-04-25 DIAGNOSIS — M2550 Pain in unspecified joint: Secondary | ICD-10-CM | POA: Insufficient documentation

## 2020-04-25 DIAGNOSIS — R5383 Other fatigue: Secondary | ICD-10-CM | POA: Insufficient documentation

## 2020-04-25 DIAGNOSIS — E1140 Type 2 diabetes mellitus with diabetic neuropathy, unspecified: Secondary | ICD-10-CM | POA: Insufficient documentation

## 2020-04-25 DIAGNOSIS — R5381 Other malaise: Secondary | ICD-10-CM | POA: Insufficient documentation

## 2020-04-25 NOTE — Assessment & Plan Note (Signed)
Memory Loss Brain Fog Numbness to lower extremities Dizziness:  Will refer to neuropsychology  Hand out given on brain fog and memory loss   Multiple joint pain Fatigue:  Will refer for physical therapy  Will check labs - concerned for autoimmune disorder   Vertigo:  Stay well hydrated  Will order meclizine as needed  Follow up:  Follow up in 2 months or sooner if needed   

## 2020-04-29 ENCOUNTER — Other Ambulatory Visit: Payer: Self-pay | Admitting: Nurse Practitioner

## 2020-04-29 DIAGNOSIS — L409 Psoriasis, unspecified: Secondary | ICD-10-CM

## 2020-04-29 DIAGNOSIS — Z8616 Personal history of COVID-19: Secondary | ICD-10-CM

## 2020-04-29 DIAGNOSIS — M79605 Pain in left leg: Secondary | ICD-10-CM

## 2020-04-29 DIAGNOSIS — M2550 Pain in unspecified joint: Secondary | ICD-10-CM

## 2020-04-29 DIAGNOSIS — R700 Elevated erythrocyte sedimentation rate: Secondary | ICD-10-CM

## 2020-04-29 LAB — VITAMIN B12: Vitamin B-12: 282 pg/mL (ref 232–1245)

## 2020-04-29 LAB — THYROID PANEL WITH TSH
Free Thyroxine Index: 2 (ref 1.2–4.9)
T3 Uptake Ratio: 28 % (ref 24–39)
T4, Total: 7.1 ug/dL (ref 4.5–12.0)
TSH: 1.17 u[IU]/mL (ref 0.450–4.500)

## 2020-04-29 LAB — VITAMIN D 25 HYDROXY (VIT D DEFICIENCY, FRACTURES): Vit D, 25-Hydroxy: 25.4 ng/mL — ABNORMAL LOW (ref 30.0–100.0)

## 2020-04-29 LAB — C-REACTIVE PROTEIN: CRP: 2 mg/L (ref 0–10)

## 2020-04-29 LAB — RHEUMATOID FACTOR: Rheumatoid fact SerPl-aCnc: <10 IU/mL (ref 0.0–13.9)

## 2020-04-29 LAB — SEDIMENTATION RATE: Sed Rate: 35 mm/hr — ABNORMAL HIGH (ref 0–30)

## 2020-05-02 ENCOUNTER — Other Ambulatory Visit: Payer: Self-pay

## 2020-05-02 ENCOUNTER — Encounter: Payer: Self-pay | Admitting: Physical Medicine and Rehabilitation

## 2020-05-02 ENCOUNTER — Encounter
Payer: BC Managed Care – PPO | Attending: Physical Medicine and Rehabilitation | Admitting: Physical Medicine and Rehabilitation

## 2020-05-02 VITALS — BP 116/74 | HR 86 | Temp 97.3°F | Ht 74.0 in | Wt 273.0 lb

## 2020-05-02 DIAGNOSIS — Z8616 Personal history of COVID-19: Secondary | ICD-10-CM | POA: Diagnosis not present

## 2020-05-02 DIAGNOSIS — R413 Other amnesia: Secondary | ICD-10-CM | POA: Insufficient documentation

## 2020-05-02 DIAGNOSIS — R200 Anesthesia of skin: Secondary | ICD-10-CM | POA: Diagnosis not present

## 2020-05-02 DIAGNOSIS — R4584 Anhedonia: Secondary | ICD-10-CM | POA: Insufficient documentation

## 2020-05-02 DIAGNOSIS — R5383 Other fatigue: Secondary | ICD-10-CM

## 2020-05-02 MED ORDER — VITAMIN D (ERGOCALCIFEROL) 1.25 MG (50000 UNIT) PO CAPS
50000.0000 [IU] | ORAL_CAPSULE | ORAL | 0 refills | Status: DC
Start: 2020-05-02 — End: 2020-05-23

## 2020-05-02 MED ORDER — METHYLPHENIDATE HCL 5 MG PO TABS
5.0000 mg | ORAL_TABLET | Freq: Every day | ORAL | 0 refills | Status: DC | PRN
Start: 2020-05-02 — End: 2020-05-23

## 2020-05-02 NOTE — Progress Notes (Signed)
Subjective:    Patient ID: Casey Greenspan., male    DOB: 12-19-1955, 64 y.o.   MRN: [id number]  HPI  Casey Hunter is a 64 year old man who presents with leg numbness, joint pain. This started since he was diagnosed with COVID-19. He has also had vertigo episodes when he works outside. He works as Curator with heavy equipment. The dizziness started two weeks ago. He got dizzy when he was hot. That had never happened before.   His BP in office is 116/74. His Amlodipine was decreased to 5mg  two weeks ago.  His sugars have been uncontrolled after COVID-19. Last night his sugar was 165. He at supper at 6:30/7 and his sugar at night was 145.  He takes 1300mg  of Tylenol every morning and at night before bed. It bothers him to carry something heavy, fatigue bothers him.  He has to wear flame protective clothes as part of his work.   The only caffeine he drinks is unsweetened tea. He drinks no soft drinks. He drinks 6-10 glasses of water per day. When dizzy, he felt his head was spinning, not the world around him spinning. He never feels his vision blacking.   Pain Inventory Average Pain 7 Pain Right Now 6 My pain is aching  In the last 24 hours, has pain interfered with the following? General activity 6 Relation with others 0 Enjoyment of life 6 What TIME of day is your pain at its worst? all Sleep (in general) Good  Pain is worse with: some activites Pain improves with: medication Relief from Meds: 4  Mobility walk without assistance  Function employed # of hrs/week 24  Neuro/Psych No problems in this area  Prior Studies new pt  Physicians involved in your care new pt   Family History  Problem Relation Age of Onset  . Hypertension Mother   . Hyperlipidemia Mother   . Hyperlipidemia Father    Social History   Socioeconomic History  . Marital status: Single    Spouse name: Not on file  . Number of children: 2  . Years of education: Not on file  . Highest  education level: Not on file  Occupational History  . Not on file  Tobacco Use  . Smoking status: Never Smoker  . Smokeless tobacco: Never Used  Substance and Sexual Activity  . Alcohol use: No    Alcohol/week: 0.0 standard drinks  . Drug use: No  . Sexual activity: Not on file  Other Topics Concern  . Not on file  Social History Narrative   Lives alone   Social Determinants of Health   Financial Resource Strain:   . Difficulty of Paying Living Expenses:   Food Insecurity:   . Worried About in the Last Year:   . in the Last Year:   Transportation Needs:   . Programme researcher, broadcasting/film/video (Medical):   Barista Lack of Transportation (Non-Medical):   Physical Activity:   . Days of Exercise per Week:   . Minutes of Exercise per Session:   Stress:   . Feeling of Stress :   Social Connections:   . Frequency of Communication with Friends and Family:   . Frequency of Social Gatherings with Friends and Family:   . Attends Religious Services:   . Active Member of Clubs or Organizations:   . Attends Freight forwarder Meetings:   Marland Kitchen Marital Status:    Past Surgical History:  Procedure Laterality Date  .  HERNIA REPAIR     Past Medical History:  Diagnosis Date  . Hyperlipidemia   . Hypertension   . Sleep apnea    BP 116/74   Pulse 86   Temp (!) 97.3 F (36.3 C)   Ht 6\' 2"  (1.88 m)   Wt 273 lb (123.8 kg)   SpO2 93%   BMI 35.05 kg/m   Opioid Risk Score:   Fall Risk Score:  `1  Depression screen PHQ 2/9  Depression screen [facility] 2/9 05/02/2020 04/24/2020 12/06/2019 04/24/2018 01/03/2018 09/21/2017 06/01/2017  Decreased Interest 2 2 0 0 0 0 0  Down, Depressed, Hopeless 0 0 0 0 0 0 0  PHQ - 2 Score 2 2 0 0 0 0 0  Altered sleeping 0 - - - - - -  Tired, decreased energy 2 - - - - - -  Change in appetite 0 - - - - - -  Feeling bad or failure about yourself  0 - - - - - -  Trouble concentrating 0 - - - - - -  Moving slowly or fidgety/restless 1 - - - - -  -  Suicidal thoughts 0 - - - - - -  PHQ-9 Score 5 - - - - - -  Difficult doing work/chores Somewhat difficult - - - - - -    Review of Systems  Neurological: Positive for weakness and numbness.  All other systems reviewed and are negative.      Objective:   Physical Exam Gen: no distress, normal appearing HEENT: oral mucosa pink and moist, NCAT Cardio: Reg rate Chest: normal effort, normal rate of breathing Abd: soft, non-distended, obese.  Ext: no edema Skin: intact Neuro: AOx3. Some word forgetfullness.  Musculoskeletal: 5/5 strength throughout. Decreased sensation in left medial calf. Normal spine flexion and extension. Negative Romberg's test.  Psych: pleasant, normal affect. +anhedonia, fatigue    Assessment & Plan:  Casey Hunter is a 64 year old man who presents with diffuse joint pain, fatigue, anhedonia, dizziness while working, and low vitamin D level with history of COVID-19.  1) Long COVID syndrome:   -Discussed and educated regarding pathophysiology of COVID-19 infection and how this is the likely cause of his fatigue, diffuse joint pain, and anhedonia. He denies depression.  -Prescribed methyphenidate 5mg  to increase energy.  -Discussed the role of cardiopulmonary rehab. He currently does not lack in strength or endurance but is more suffering from fatigue which I hope the Methylphenidate will help with. Discussed to take in the morning with food.   2) Vitamin D deficiency: Prescribed 50,000U ergocalciferol weekly for 7 weeks. Explained that this can also related to low energy, anhedonia, and diffuse joint pain.  3) Hypotension: low in office and could be contributing to dizziness when he works in the heat. Check BP daily, log, and bring to next visit. Also check when feeling dizzy during work. He stays well hydrated. Decrease Amlodipine to 2.5mg  daily.   4) Diffuse joint pain: Stop Meloxicam as may be contributing to GI distress. Vitamin D prescription strength  supplementation may help. Steroids may be considered in future, but side effects discussed with patient. Not ideal given his diabetes.   5) Numbness in left medial calf may be related to viral infection of nerve. Unlikely secondary to spinal stenosis. May self resolve over time. Not greatly affecting function. Balance deficit during dizziness more likely secondary to low blood pressure.   60 minutes of face to face patient care time were spent during this visit  on medication review, education regarding long COVID syndrome, review and explanation of labs, explanation of diagnoses and treatment of low blood pressure related dizziness and long-COVID related fatigue. All questions were encouraged and answered. Follow up with me in 2 weeks.

## 2020-05-13 ENCOUNTER — Other Ambulatory Visit: Payer: Self-pay | Admitting: Family Medicine

## 2020-05-13 DIAGNOSIS — E1142 Type 2 diabetes mellitus with diabetic polyneuropathy: Secondary | ICD-10-CM

## 2020-05-13 NOTE — Telephone Encounter (Signed)
Requested Prescriptions  Pending Prescriptions Disp Refills  . FARXIGA 10 MG TABS tablet [Pharmacy Med Name: FARXIGA 10 MG TAB] 30 tablet     Sig: TAKE 1 TABLET BY MOUTH DAILY     Endocrinology:  Diabetes - SGLT2 Inhibitors Failed - 05/13/2020  8:41 AM      Failed - LDL in normal range and within 360 days    Ldl Cholesterol, Calc  Date Value Ref Range Status  11/22/2012 107 (H) 0 - 100 mg/dL Final   LDL Chol Calc (NIH)  Date Value Ref Range Status  01/18/2020 131 (H) 0 - 99 mg/dL Final         Passed - Cr in normal range and within 360 days    Creatinine  Date Value Ref Range Status  11/22/2012 0.94 0.60 - 1.30 mg/dL Final   Creatinine, Ser  Date Value Ref Range Status  04/17/2020 0.79 0.76 - 1.27 mg/dL Final         Passed - HBA1C is between 0 and 7.9 and within 180 days    HB A1C (BAYER DCA - WAIVED)  Date Value Ref Range Status  04/17/2020 7.9 (H) <7.0 % Final    Comment:                                          Diabetic Adult            <7.0                                       Healthy Adult        4.3 - 5.7                                                           (DCCT/NGSP) American Diabetes Association's Summary of Glycemic Recommendations for Adults with Diabetes: Hemoglobin A1c <7.0%. More stringent glycemic goals (A1c <6.0%) may further reduce complications at the cost of increased risk of hypoglycemia.          Passed - eGFR in normal range and within 360 days    EGFR (African American)  Date Value Ref Range Status  11/22/2012 >60  Final   GFR calc Af Amer  Date Value Ref Range Status  04/17/2020 110 >59 mL/min/1.73 Final    Comment:    **[facility] currently reports eGFR in compliance with the current**   recommendations of the [employer]. [facility] will   update reporting as new guidelines are published from the NKF-ASN   Task force.    EGFR (Non-African Amer.)  Date Value Ref Range Status  11/22/2012 >60  Final    Comment:    eGFR  values <75m/min/1.73 m2 may be an indication of chronic kidney disease (CKD). Calculated eGFR is useful in patients with stable renal function. The eGFR calculation will not be reliable in acutely ill patients when serum creatinine is changing rapidly. It is not useful in  patients on dialysis. The eGFR calculation may not be applicable to patients at the low and high extremes of body sizes, pregnant women, and vegetarians.    GFR calc  non Af Amer  Date Value Ref Range Status  04/17/2020 95 >59 mL/min/1.73 Final   GFR  Date Value Ref Range Status  01/30/2020 97.20 >60.00 mL/min Final         Passed - Valid encounter within last 6 months    Recent Outpatient Visits          3 weeks ago Diabetic peripheral neuropathy associated with type 2 diabetes mellitus ([place])   [place], Megan P, DO   1 month ago History of 2019 novel coronavirus disease (COVID-19)   [place], Kaufman, DO   2 months ago History of 2019 novel coronavirus disease (COVID-19)   [place], Ramsay, DO   3 months ago History of 2019 novel coronavirus disease (COVID-19)   [place], Megan P, DO   3 months ago Diabetic peripheral neuropathy associated with type 2 diabetes mellitus ([place])   [place], Griffith, DO      Future Appointments            In 1 week Johnson, Barb Merino, DO [facility] [place]   In 1 month  [facility] [place]   In 4 months Bo Merino, MD [facility]

## 2020-05-16 ENCOUNTER — Ambulatory Visit: Payer: BC Managed Care – PPO | Admitting: Pharmacist

## 2020-05-16 DIAGNOSIS — I152 Hypertension secondary to endocrine disorders: Secondary | ICD-10-CM

## 2020-05-16 DIAGNOSIS — E1159 Type 2 diabetes mellitus with other circulatory complications: Secondary | ICD-10-CM

## 2020-05-16 DIAGNOSIS — E1169 Type 2 diabetes mellitus with other specified complication: Secondary | ICD-10-CM

## 2020-05-16 DIAGNOSIS — E119 Type 2 diabetes mellitus without complications: Secondary | ICD-10-CM

## 2020-05-16 NOTE — Chronic Care Management (AMB) (Signed)
Chronic Care Management   Follow Up Note   05/16/2020 Name: Casey Hunter. MRN: [id number] DOB: Jun 15, 1956  Referred by: Valerie Roys, DO Reason for referral : Chronic Care Management (Medication Management)   Casey Hunter. is a 64 y.o. year old male who is a primary care patient of [name], [name], DO. The CCM team was consulted for assistance with chronic disease management and care coordination needs.    Contacted patient for medication management review.   Review of patient status, including review of consultants reports, relevant laboratory and other test results, and collaboration with appropriate care team members and the patient's provider was performed as part of comprehensive patient evaluation and provision of chronic care management services.    SDOH (Social Determinants of Health) assessments performed: Yes See Care Plan activities for detailed interventions related to [facility])     Outpatient Encounter Medications as of 05/16/2020  Medication Sig Note  . acetaminophen (TYLENOL) 650 MG CR tablet Take 1,300 mg by mouth in the morning and at bedtime.   Marland Kitchen amLODipine (NORVASC) 5 MG tablet Take 1 tablet (5 mg total) by mouth daily. 05/16/2020: 2.5 mg daily   . aspirin 81 MG tablet Take 81 mg by mouth daily.   . colesevelam (WELCHOL) 625 MG tablet Take 3 tablets (1,875 mg total) by mouth 2 (two) times daily with a meal. Takes 3 tables twice daily   . cyclobenzaprine (FLEXERIL) 10 MG tablet Take 1 tablet (10 mg total) by mouth at bedtime.   . Dulaglutide (TRULICITY) 3 MO/7.0BE SOPN Inject 3 mg into the skin once a week.   . ezetimibe (ZETIA) 10 MG tablet Take 1 tablet (10 mg total) by mouth daily.   Marland Kitchen FARXIGA 10 MG TABS tablet TAKE 1 TABLET BY MOUTH DAILY   . lovastatin (MEVACOR) 20 MG tablet Take 1 tablet (20 mg total) by mouth at bedtime.   . meclizine (ANTIVERT) 12.5 MG tablet Take 1 tablet (12.5 mg total) by mouth 3 (three) times daily as needed for dizziness.   .  metFORMIN (GLUCOPHAGE) 1000 MG tablet Take 1 tablet (1,000 mg total) by mouth 2 (two) times daily with a meal.   . methylphenidate (RITALIN) 5 MG tablet Take 1 tablet (5 mg total) by mouth daily as needed.   . niacin (NIASPAN) 1000 MG CR tablet Take 1 tablet (1,000 mg total) by mouth at bedtime.   Marland Kitchen olmesartan-hydrochlorothiazide (BENICAR HCT) 40-25 MG tablet TAKE 1 TABLET DAILY.   Marland Kitchen pregabalin (LYRICA) 50 MG capsule Take 1 capsule (50 mg total) by mouth 3 (three) times daily.   . Vitamin D, Ergocalciferol, (DRISDOL) 1.25 MG (50000 UNIT) CAPS capsule Take 1 capsule (50,000 Units total) by mouth every 7 (seven) days.   . Continuous Blood Gluc Receiver (FREESTYLE LIBRE 14 DAY READER) DEVI USE AS DIRECTED   . Continuous Blood Gluc Sensor (FREESTYLE LIBRE 14 DAY SENSOR) MISC Inject 1 each into the skin See admin instructions.   . meloxicam (MOBIC) 15 MG tablet Take 1 tablet (15 mg total) by mouth daily. (Patient not taking: Reported on 05/16/2020)   . sildenafil (REVATIO) 20 MG tablet Take 1 tablet (20 mg total) by mouth as needed. 1-5 prn    No facility-administered encounter medications on file as of 05/16/2020.     Objective:   Goals Addressed              This Visit's Progress     Patient Stated   .  PharmD "  I just take so many medications" (pt-stated)        CARE PLAN ENTRY (see longtitudinal plan of care for additional care plan information)  Current Barriers:  . Diabetes: uncontrolled, complicated by chronic medical conditions including HTN, HLD, OA, psoriasis, s/p COVID, most recent A1c 7.9% o Reports he just started on methylphenidate by Dr. Ranell Patrick. Denies much benefit in fatigue yet o Also started weekly Vitamin D 50,000 units.  . Most recent eGFR: >90 mL/min . Current antihyperglycemic regimen: metformin 2563 mg BID, Trulicity 3 mg weekly, Farxiga 10 mg daily . Current blood glucose readings: Utilizing FreeStyle Libre CGM Average Glucose: 7 day = 175; 14 day = 160; last 30  days 156 Time in Goal (80-180) - Time in range 70-180: 39% - Time above range: 61% - Time below range: 1% Reports a pattern of bedtime readings ~160-180, but readings 12 am -5 am increase to 190-200s . Current glucose readings:  o Breakfast: o Lunch:  o Supper: roasted chicken, sweet potato fries, macaroni and cheese; 2-3 hushpuppies, unsweet tea . Cardiovascular risk reduction: o Current hypertensive regimen: olmesartan/HCTZ 40/25 mg daily; amlodipine 2.5 mg daily (just reduced by Dr. Ranell Patrick), home BP readings 100-120s/60-70s o Current hyperlipidemia regimen: Welchol 1875 mg TID, lovastatin 20 mg daily, ezetimibe 10 mg daily, niacin 1000 mg daily; last LDL not at goal, was 131, TG controlled at 130  - Hx severe muscle pain w/ rosuvastatin 10 mg twice weekly, atorvastatin, dose unknown - Notes that he doesn't like taking niacin because of flushing. Denies hx using fenofibrate o Current antiplatelet regimen: ASA 81 mg  . Osteoarthritis: pregabalin 50 mg TID, cyclobenzaprine 10 mg daily; stopped meloxicam per Dr. Ranell Patrick to see if impacting GI upset  Pharmacist Clinical Goal(s):  Marland Kitchen Over the next 90 days, patient will work with PharmD and primary care provider to address optimized medication management  Interventions: . Comprehensive medication review performed, medication list updated in electronic medical record . Inter-disciplinary care team collaboration (see longitudinal plan of care) . Extensive discussion of new medications, mechanisms, side effects. Encouraged patient to continue to follow with post-COVID clinic  . Discussed concept of Dawn Phenomenon as reason for sugar increase in early hours of the morning.  . Discussed goal 2 hour post prandial <180. Discussed ways to reduce CHO content of meals to target post prandials <180. He verbalized understanding.  . Encouraged to continue to check home BP. Still at low end of normal. Consider d/c amlodipine (patient reports splitting 5 mg  into 2.5 is difficult) vs decrease HCTZ dose in olmesartan/HCTZ to 12.5 to reduce risk of dehydration-related hypotension/dizziness . Discussed cholesterol. Discussed goal LDL <70 given DM, risk factors. Given myalgias w/ rosuvastatin and atorvastatin, consider PCKS9i. Pending on-treatment LDL, may allow for d/c of ezetimibe. Would continue lovastatin d/t pleiotropic benefits of statins. May consider d/c niacin as well, TG very well controlled.   Patient Self Care Activities:  . Patient will check blood glucose Q6H , document, and provide at future appointments . Patient will take medications as prescribed . Patient will report any questions or concerns to provider   Please see past updates related to this goal by clicking on the "Past Updates" button in the selected goal          Plan:  - Scheduled f/u call in ~ 8-12 weeks  Catie Darnelle Maffucci, PharmD, [place] [phone]

## 2020-05-16 NOTE — Patient Instructions (Signed)
Visit Information  Goals Addressed              This Visit's Progress     Patient Stated   .  PharmD "I just take so many medications" (pt-stated)        CARE PLAN ENTRY (see longtitudinal plan of care for additional care plan information)  Current Barriers:  . Diabetes: uncontrolled, complicated by chronic medical conditions including HTN, HLD, OA, psoriasis, s/p COVID, most recent A1c 7.9% o Reports he just started on methylphenidate by Dr. Ranell Patrick. Denies much benefit in fatigue yet o Also started weekly Vitamin D 50,000 units.  . Most recent eGFR: >90 mL/min . Current antihyperglycemic regimen: metformin 7425 mg BID, Trulicity 3 mg weekly, Farxiga 10 mg daily . Current blood glucose readings: Utilizing FreeStyle Libre CGM Average Glucose: 7 day = 175; 14 day = 160; last 30 days 156 Time in Goal (80-180) - Time in range 70-180: 39% - Time above range: 61% - Time below range: 1% Reports a pattern of bedtime readings ~160-180, but readings 12 am -5 am increase to 190-200s . Current glucose readings:  o Breakfast: o Lunch:  o Supper: roasted chicken, sweet potato fries, macaroni and cheese; 2-3 hushpuppies, unsweet tea . Cardiovascular risk reduction: o Current hypertensive regimen: olmesartan/HCTZ 40/25 mg daily; amlodipine 2.5 mg daily (just reduced by Dr. Ranell Patrick), home BP readings 100-120s/60-70s o Current hyperlipidemia regimen: Welchol 1875 mg TID, lovastatin 20 mg daily, ezetimibe 10 mg daily, niacin 1000 mg daily; last LDL not at goal, was 131, TG controlled at 130  - Hx severe muscle pain w/ rosuvastatin 10 mg twice weekly, atorvastatin, dose unknown - Notes that he doesn't like taking niacin because of flushing. Denies hx using fenofibrate o Current antiplatelet regimen: ASA 81 mg  . Osteoarthritis: pregabalin 50 mg TID, cyclobenzaprine 10 mg daily; stopped meloxicam per Dr. Ranell Patrick to see if impacting GI upset  Pharmacist Clinical Goal(s):  Marland Kitchen Over the next 90 days,  patient will work with PharmD and primary care provider to address optimized medication management  Interventions: . Comprehensive medication review performed, medication list updated in electronic medical record . Inter-disciplinary care team collaboration (see longitudinal plan of care) . Extensive discussion of new medications, mechanisms, side effects. Encouraged patient to continue to follow with post-COVID clinic  . Discussed concept of Dawn Phenomenon as reason for sugar increase in early hours of the morning.  . Discussed goal 2 hour post prandial <180. Discussed ways to reduce CHO content of meals to target post prandials <180. He verbalized understanding.  . Encouraged to continue to check home BP. Still at low end of normal. Consider d/c amlodipine (patient reports splitting 5 mg into 2.5 is difficult) vs decrease HCTZ dose in olmesartan/HCTZ to 12.5 to reduce risk of dehydration-related hypotension/dizziness . Discussed cholesterol. Discussed goal LDL <70 given DM, risk factors. Given myalgias w/ rosuvastatin and atorvastatin, consider PCKS9i. Pending on-treatment LDL, may allow for d/c of ezetimibe. Would continue lovastatin d/t pleiotropic benefits of statins. May consider d/c niacin as well, TG very well controlled.   Patient Self Care Activities:  . Patient will check blood glucose Q6H , document, and provide at future appointments . Patient will take medications as prescribed . Patient will report any questions or concerns to provider   Please see past updates related to this goal by clicking on the "Past Updates" button in the selected goal         The patient verbalized understanding of instructions provided today  and declined a print copy of patient instruction materials.    Plan:  - Scheduled f/u call in ~ 8-12 weeks  Catie Darnelle Maffucci, PharmD, [place] [phone]

## 2020-05-22 DIAGNOSIS — E113393 Type 2 diabetes mellitus with moderate nonproliferative diabetic retinopathy without macular edema, bilateral: Secondary | ICD-10-CM | POA: Diagnosis not present

## 2020-05-23 ENCOUNTER — Ambulatory Visit: Payer: BC Managed Care – PPO | Admitting: Family Medicine

## 2020-05-23 ENCOUNTER — Other Ambulatory Visit: Payer: Self-pay

## 2020-05-23 ENCOUNTER — Encounter
Payer: BC Managed Care – PPO | Attending: Physical Medicine and Rehabilitation | Admitting: Physical Medicine and Rehabilitation

## 2020-05-23 ENCOUNTER — Ambulatory Visit (INDEPENDENT_AMBULATORY_CARE_PROVIDER_SITE_OTHER): Payer: BC Managed Care – PPO | Admitting: Family Medicine

## 2020-05-23 ENCOUNTER — Encounter: Payer: Self-pay | Admitting: Physical Medicine and Rehabilitation

## 2020-05-23 ENCOUNTER — Encounter: Payer: Self-pay | Admitting: Family Medicine

## 2020-05-23 VITALS — BP 132/86 | HR 88 | Temp 97.6°F | Ht 74.0 in | Wt 274.0 lb

## 2020-05-23 VITALS — BP 123/86 | HR 88 | Temp 97.8°F | Wt 272.2 lb

## 2020-05-23 DIAGNOSIS — R4584 Anhedonia: Secondary | ICD-10-CM | POA: Diagnosis not present

## 2020-05-23 DIAGNOSIS — R5383 Other fatigue: Secondary | ICD-10-CM | POA: Diagnosis not present

## 2020-05-23 DIAGNOSIS — R200 Anesthesia of skin: Secondary | ICD-10-CM

## 2020-05-23 DIAGNOSIS — Z8616 Personal history of COVID-19: Secondary | ICD-10-CM | POA: Diagnosis not present

## 2020-05-23 DIAGNOSIS — E1142 Type 2 diabetes mellitus with diabetic polyneuropathy: Secondary | ICD-10-CM

## 2020-05-23 DIAGNOSIS — E538 Deficiency of other specified B group vitamins: Secondary | ICD-10-CM

## 2020-05-23 DIAGNOSIS — R413 Other amnesia: Secondary | ICD-10-CM

## 2020-05-23 MED ORDER — METHYLPHENIDATE HCL 5 MG PO TABS: 10.0000 mg | ORAL_TABLET | Freq: Every day | ORAL | 0 refills | Status: DC | PRN

## 2020-05-23 MED ORDER — AMLODIPINE BESYLATE 2.5 MG PO TABS: 2.5000 mg | ORAL_TABLET | Freq: Every day | ORAL | 3 refills | Status: DC

## 2020-05-23 MED ORDER — CYANOCOBALAMIN 1000 MCG/ML IJ SOLN: 1000.0000 ug | INTRAMUSCULAR | Status: DC

## 2020-05-23 MED ORDER — VITAMIN D (ERGOCALCIFEROL) 1.25 MG (50000 UNIT) PO CAPS: 50000.0000 [IU] | ORAL_CAPSULE | ORAL | 0 refills | Status: DC

## 2020-05-23 MED ORDER — CYANOCOBALAMIN 1000 MCG/ML IJ SOLN
1000.0000 ug | INTRAMUSCULAR | Status: AC
Start: 2020-05-23 — End: 2020-06-13
  Administered 2020-05-23 – 2020-06-13 (×4): 1000 ug via INTRAMUSCULAR

## 2020-05-23 NOTE — Progress Notes (Signed)
Subjective:    Patient ID: Casey Hunter., male    DOB: 02-24-1956, 64 y.o.   MRN: [id number]  HPI  Mr. Tuman is a 63 year old man who presents for follow-up of left leg numbness, diffuse joint pains, and fatigue following COVID-19 infection.  He has also had vertigo episodes when he works outside. 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 The dizziness started six weeks ago and worries him. He wants to be able to continue working- has worked at current job for 35 years- but is nervous about falling. He has had 2 falls since last appointment and has to climb ladders for his work. He does not believe there is any alternative work he can do for this company. He does not feel that another company will hire him given his age.   His BP in office is 132/86. I advised him to cut his BP med (amlodipine) in half last visit to 2.5mg  and it is now much better controlled. He was checking his BP when it was very hot outside and most readings continue to be low and he continues to have symptoms of dizziness.  His sugars have been uncontrolled after COVID-19. They rose to dangerous levels when he received corticosteroids.   He takes 1300mg  of Tylenol every morning and at night before bed. It bothers him to carry something heavy, fatigue bothers him.  He has to wear flame protective clothes as part of his work.   The only caffeine he drinks is unsweetened tea. He drinks no soft drinks. He drinks 6-10 glasses of water per day. When dizzy, he felt his head was spinning, not the world around him spinning. He never feels his vision blacking.   Current pain is 6/10  He has been referred to rheumatology but RA <10 and CRP is normal.  He has been referred to neuropsych for cognitive testing.   He completed 4 days of 50,000 units of Vitamin D  He has not noted benefits with methylphenidate.   Pain Inventory Average Pain 6 Pain Right Now 6 My pain is dull and tingling  In  the last 24 hours, has pain interfered with the following? General activity 8 Relation with others 5 Enjoyment of life 8 What TIME of day is your pain at its worst? all Sleep (in general) Good  Pain is worse with: some activites Pain improves with: medication Relief from Meds: 5  Mobility walk without assistance  Function employed # of hrs/week 24 what is your job?  Neuro/Psych weakness tingling dizziness confusion  Prior Studies Any changes since last visit?  no  Physicians involved in your care Any changes since last visit?  no   Family History  Problem Relation Age of Onset  . Hypertension Mother   . Hyperlipidemia Mother   . Hyperlipidemia Father    Social History   Socioeconomic History  . Marital status: Single    Spouse name: Not on file  . Number of children: 2  . Years of education: Not on file  . Highest education level: Not on file  Occupational History  . Not on file  Tobacco Use  . Smoking status: Never Smoker  . Smokeless tobacco: Never Used  Substance and Sexual Activity  . Alcohol use: No    Alcohol/week: 0.0 standard drinks  . Drug use: No  . Sexual activity: Not on file  Other Topics Concern  . Not on file  Social History Narrative  Lives alone   Social Determinants of Health   Financial Resource Strain:   . Difficulty of Paying Living Expenses:   Food Insecurity:   . Worried About Programme researcher, broadcasting/film/video in the Last Year:   . Barista in the Last Year:   Transportation Needs:   . Freight forwarder (Medical):   Marland Kitchen Lack of Transportation (Non-Medical):   Physical Activity:   . Days of Exercise per Week:   . Minutes of Exercise per Session:   Stress:   . Feeling of Stress :   Social Connections:   . Frequency of Communication with Friends and Family:   . Frequency of Social Gatherings with Friends and Family:   . Attends Religious Services:   . Active Member of Clubs or Organizations:   . Attends  Banker Meetings:   Marland Kitchen Marital Status:    Past Surgical History:  Procedure Laterality Date  . HERNIA REPAIR     Past Medical History:  Diagnosis Date  . Hyperlipidemia   . Hypertension   . Sleep apnea    BP 132/86   Pulse 88   Temp 97.6 F (36.4 C)   Ht 6\' 2"  (1.88 m)   Wt 274 lb (124.3 kg)   SpO2 95%   BMI 35.18 kg/m   Opioid Risk Score:   Fall Risk Score:  `1  Depression screen PHQ 2/9  Depression screen [facility] 2/9 05/02/2020 04/24/2020 12/06/2019 04/24/2018 01/03/2018 09/21/2017 06/01/2017  Decreased Interest 2 2 0 0 0 0 0  Down, Depressed, Hopeless 0 0 0 0 0 0 0  PHQ - 2 Score 2 2 0 0 0 0 0  Altered sleeping 0 - - - - - -  Tired, decreased energy 2 - - - - - -  Change in appetite 0 - - - - - -  Feeling bad or failure about yourself  0 - - - - - -  Trouble concentrating 0 - - - - - -  Moving slowly or fidgety/restless 1 - - - - - -  Suicidal thoughts 0 - - - - - -  PHQ-9 Score 5 - - - - - -  Difficult doing work/chores Somewhat difficult - - - - - -    Review of Systems  Constitutional: Positive for fatigue.  HENT: Negative.   Eyes: Negative.   Respiratory: Negative.   Cardiovascular: Negative.   Gastrointestinal: Negative.   Endocrine: Negative.   Genitourinary: Negative.   Musculoskeletal: Positive for arthralgias and gait problem.  Skin: Negative.   Allergic/Immunologic: Negative.   Neurological: Positive for dizziness, weakness and numbness.  Hematological: Negative.   Psychiatric/Behavioral: Positive for confusion.  All other systems reviewed and are negative.      Objective:   Physical Exam Gen: no distress, normal appearing HEENT: oral mucosa pink and moist, NCAT Cardio: Reg rate Chest: normal effort, normal rate of breathing Abd: soft, non-distended Ext: no edema Skin: intact Neuro: AOx3. Some word forgetfullness.  Musculoskeletal: 5/5 strength throughout. Decreased sensation in left medial calf. Normal spine flexion and extension.  Negative Romberg's test.  Psych: pleasant, normal affect. +anhedonia, fatigue    Assessment & Plan:  Mr. Tienda is a 64 year old man who presents with diffuse joint pain, fatigue, anhedonia, dizziness while working, and low vitamin D level with history of COVID-19.  1) Long COVID Syndrome:  -Discussed and educated regarding pathophysiology of COVID-19 infection and how this is the likely cause of his fatigue, diffuse joint  pain, and anhedonia. He denies depression.  -Increase methyphenidate to 10mg  to increase energy.  -Discussed the role of cardiopulmonary rehab. He currently does not lack in strength or endurance but is more suffering from fatigue which I hope the Methylphenidate will help with. Discussed to take in the morning with food.   2) Vitamin D deficiency: Prescribed 50,000U ergocalciferol weekly for 7 weeks (needed 3 remaining pills which I sent today). Explained that this can also related to low energy, anhedonia, and diffuse joint pain.  3) Hypotension: better in office and could be contributing to dizziness when he works in the heat. Check BP daily, log, and bring to next visit. Also check when feeling dizzy during work. He stays well hydrated. Decrease Amlodipine to 2.5mg  daily (script provided) on days of no work- no not take medication of days he is working.   4) Diffuse joint pain: May restart Meloxicam. Vitamin D prescription strength supplementation may help. Steroids may be considered in future, but side effects discussed with patient. Not ideal given his diabetes and negative response to steroids in the past.   5) Numbness in left medial calf may be related to viral infection of nerve. Unlikely secondary to spinal stenosis. May self resolve over time. Not greatly affecting function. Balance deficit during dizziness more likely secondary to low blood pressure.   6) Cognitive deficits: May continue with plan for neuropsych testing as this may be helpful.  7) Impaired  mobility and ADLs: provided note recommending that he not climb ladders in work during hot summer days given hypotension and dizziness.   40 minutes of face to face patient care time were spent during this visit on medication review, education regarding long COVID syndrome, review and explanation of labs, explanation of diagnoses and treatment of low blood pressure related dizziness and long-COVID related fatigue, and discussion of cognition, difficulties at work, quality of life, and LLE numbness. All questions were encouraged and answered. Follow up with me in 4 weeks.

## 2020-05-23 NOTE — Progress Notes (Signed)
BP 123/86 (BP Location: Left Arm, Patient Position: Sitting, Cuff Size: Normal)   Pulse 88   Temp 97.8 F (36.6 C) (Oral)   Wt 272 lb 3.2 oz (123.5 kg)   SpO2 96%   BMI 34.95 kg/m    Subjective:    Patient ID: Casey Hunter., male    DOB: 04-03-1956, 64 y.o.   MRN: [id number]  HPI: Crimson Beer. is a 64 y.o. male  Chief Complaint  Patient presents with  . covid 19    follow up    Still really dizzy in the heat. His BP is dropping and he has almost felt like he is going to fall out a couple of times. He continues with numbness in his L leg and is also afraid that this is going to make him fall. He has been seeing the long-haul clinic for COVID. Has been working on the symptoms. He's scheduled to see neurology for the brain fog and his leg and was treated for vitamin D deficiency. He's also been started on ritalin to try to help with the brain fog. They are titrating the dose. He notes that his bad days are still really bad and he has several bad days following his days at work. The long haul clinic has recommended that he remain out of work during the hot weather due to hypotension. He is otherwise feeling well with no other concerns or complaints at this time.   Relevant past medical, surgical, family and social history reviewed and updated as indicated. Interim medical history since our last visit reviewed. Allergies and medications reviewed and updated.  Review of Systems  Constitutional: Positive for fatigue. Negative for activity change, appetite change, chills, diaphoresis, fever and unexpected weight change.  Respiratory: Negative.   Cardiovascular: Negative.   Gastrointestinal: Negative.   Musculoskeletal: Positive for arthralgias, back pain and myalgias. Negative for gait problem, joint swelling, neck pain and neck stiffness.  Skin: Negative.   Neurological: Positive for dizziness, light-headedness and numbness. Negative for tremors, seizures, syncope, facial  asymmetry, speech difficulty, weakness and headaches.  Hematological: Negative.   Psychiatric/Behavioral: Positive for confusion. Negative for agitation, behavioral problems, decreased concentration, dysphoric mood, hallucinations, self-injury, sleep disturbance and suicidal ideas. The patient is not nervous/anxious and is not hyperactive.     Per HPI unless specifically indicated above     Objective:    BP 123/86 (BP Location: Left Arm, Patient Position: Sitting, Cuff Size: Normal)   Pulse 88   Temp 97.8 F (36.6 C) (Oral)   Wt 272 lb 3.2 oz (123.5 kg)   SpO2 96%   BMI 34.95 kg/m   Wt Readings from Last 3 Encounters:  05/23/20 272 lb 3.2 oz (123.5 kg)  05/23/20 274 lb (124.3 kg)  05/02/20 273 lb (123.8 kg)    Physical Exam Vitals and nursing note reviewed.  Constitutional:      General: He is not in acute distress.    Appearance: Normal appearance. He is not ill-appearing, toxic-appearing or diaphoretic.  HENT:     Head: Normocephalic and atraumatic.     Right Ear: External ear normal.     Left Ear: External ear normal.     Nose: Nose normal.     Mouth/Throat:     Mouth: Mucous membranes are moist.     Pharynx: Oropharynx is clear.  Eyes:     General: No scleral icterus.       Right eye: No discharge.  Left eye: No discharge.     Extraocular Movements: Extraocular movements intact.     Conjunctiva/sclera: Conjunctivae normal.     Pupils: Pupils are equal, round, and reactive to light.  Cardiovascular:     Rate and Rhythm: Normal rate and regular rhythm.     Pulses: Normal pulses.     Heart sounds: Normal heart sounds. No murmur heard.  No friction rub. No gallop.   Pulmonary:     Effort: Pulmonary effort is normal. No respiratory distress.     Breath sounds: Normal breath sounds. No stridor. No wheezing, rhonchi or rales.  Chest:     Chest wall: No tenderness.  Musculoskeletal:        General: Normal range of motion.     Cervical back: Normal range of  motion and neck supple.  Skin:    General: Skin is warm and dry.     Capillary Refill: Capillary refill takes less than 2 seconds.     Coloration: Skin is not jaundiced or pale.     Findings: No bruising, erythema, lesion or rash.  Neurological:     General: No focal deficit present.     Mental Status: He is alert and oriented to person, place, and time. Mental status is at baseline.  Psychiatric:        Mood and Affect: Mood normal.        Behavior: Behavior normal.        Thought Content: Thought content normal.        Judgment: Judgment normal.     Results for orders placed or performed in visit on 04/24/20  Thyroid Panel With TSH  Result Value Ref Range   TSH 1.170 0.450 - 4.500 uIU/mL   T4, Total 7.1 4.5 - 12.0 ug/dL   T3 Uptake Ratio 28 24 - 39 %   Free Thyroxine Index 2.0 1.2 - 4.9  Vitamin D, 25-hydroxy  Result Value Ref Range   Vit D, 25-Hydroxy 25.4 (L) 30.0 - 100.0 ng/mL  Vitamin B12  Result Value Ref Range   Vitamin B-12 282 232 - 1,245 pg/mL  Rheumatoid factor  Result Value Ref Range   Rhuematoid fact SerPl-aCnc <10.0 0.0 - 13.9 IU/mL  Sedimentation Rate  Result Value Ref Range   Sed Rate 35 (H) 0 - 30 mm/hr  C-reactive protein  Result Value Ref Range   CRP 2 0 - 10 mg/L      Assessment & Plan:   Problem List Items Addressed This Visit      Other   History of COVID-19 - Primary    Has been having hypotension, continued neuropathy and extreme fatigue and brain fog. Following with the COVID-long haul clinic. Given his worsening symptoms during the summer heat, we are going to keep him out of work until the hot weather is over. Continue to monitor. Call with any concerns.        Other Visit Diagnoses    Vitamin B12 deficiency       On low end of normal. Will start B12 shots to help speed up his recovery. Call with any concerns.    Relevant Medications   cyanocobalamin ((VITAMIN B-12)) injection 1,000 mcg   cyanocobalamin ((VITAMIN B-12)) injection  1,000 mcg (Start on 06/20/2020 12:00 AM)       Follow up plan: Return End of August, DM follow up.

## 2020-05-23 NOTE — Patient Instructions (Signed)
Can take Naproxen 500mg  up to two times per day. Do not take with Meloxicam.

## 2020-05-23 NOTE — Assessment & Plan Note (Signed)
Has been having hypotension, continued neuropathy and extreme fatigue and brain fog. Following with the COVID-long haul clinic. Given his worsening symptoms during the summer heat, we are going to keep him out of work until the hot weather is over. Continue to monitor. Call with any concerns.

## 2020-05-24 ENCOUNTER — Encounter: Payer: Self-pay | Admitting: Physical Medicine and Rehabilitation

## 2020-05-30 ENCOUNTER — Ambulatory Visit (INDEPENDENT_AMBULATORY_CARE_PROVIDER_SITE_OTHER): Payer: BC Managed Care – PPO

## 2020-05-30 ENCOUNTER — Other Ambulatory Visit: Payer: Self-pay

## 2020-05-30 DIAGNOSIS — E538 Deficiency of other specified B group vitamins: Secondary | ICD-10-CM

## 2020-06-02 ENCOUNTER — Other Ambulatory Visit: Payer: Self-pay | Admitting: Family Medicine

## 2020-06-06 ENCOUNTER — Other Ambulatory Visit: Payer: Self-pay

## 2020-06-06 ENCOUNTER — Telehealth: Payer: Self-pay | Admitting: Family Medicine

## 2020-06-06 ENCOUNTER — Ambulatory Visit (INDEPENDENT_AMBULATORY_CARE_PROVIDER_SITE_OTHER): Payer: BC Managed Care – PPO

## 2020-06-06 DIAGNOSIS — E538 Deficiency of other specified B group vitamins: Secondary | ICD-10-CM

## 2020-06-06 NOTE — Telephone Encounter (Signed)
Patient is calling to check on the status of medical records to be sent [employer] Fax- [phone] And the claim Number [id number]  [employer] sent a request but the office has not responded as of yet.   Medical records are need to process long term disability Patient Call back number [phone]

## 2020-06-10 ENCOUNTER — Other Ambulatory Visit: Payer: Self-pay | Admitting: Family Medicine

## 2020-06-10 NOTE — Telephone Encounter (Signed)
Spoke with [employer] and the fax was confirmed for 06/05/20. [employer] came yesterday but the representative is going to fax over another copy to make sure it was received and they will follow up with [place] in 30 days if this has not been completed. Pt notified.

## 2020-06-10 NOTE — Telephone Encounter (Signed)
Requested Prescriptions  Pending Prescriptions Disp Refills  . TRULICITY 3 MG/0.5ML SOPN [Pharmacy Med Name: TRULICITY 3 MG/0.5 ML PEN] 12 pen 1    Sig: INJECT 3 MG INTO THE SKIN ONCE A WEEK.     Endocrinology:  Diabetes - GLP-1 Receptor Agonists Passed - 06/10/2020  8:26 PM      Passed - HBA1C is between 0 and 7.9 and within 180 days    HB A1C (BAYER DCA - WAIVED)  Date Value Ref Range Status  04/17/2020 7.9 (H) <7.0 % Final    Comment:                                          Diabetic Adult            <7.0                                       Healthy Adult        4.3 - 5.7                                                           (DCCT/NGSP) American Diabetes Association's Summary of Glycemic Recommendations for Adults with Diabetes: Hemoglobin A1c <7.0%. More stringent glycemic goals (A1c <6.0%) may further reduce complications at the cost of increased risk of hypoglycemia.          Passed - Valid encounter within last 6 months    Recent Outpatient Visits          2 weeks ago History of COVID-19   [facility] Kell, Megan P, DO   1 month ago Diabetic peripheral neuropathy associated with type 2 diabetes mellitus (HCC)   [facility] [place], [place], DO   2 months ago History of 2019 novel coronavirus disease (COVID-19)   [facility] [place], [place], DO   3 months ago History of 2019 novel coronavirus disease (COVID-19)   [facility] [place], [place], DO   4 months ago History of 2019 novel coronavirus disease (COVID-19)   [facility] Johnson, Megan P, DO      Future Appointments            In 2 weeks  Post-COVID [facility] at [place]   In 1 month Dorcas Carrow, DO [facility]   In 3 months Pollyann Savoy, MD [facility]

## 2020-06-11 ENCOUNTER — Other Ambulatory Visit: Payer: Self-pay | Admitting: Family Medicine

## 2020-06-11 DIAGNOSIS — E1142 Type 2 diabetes mellitus with diabetic polyneuropathy: Secondary | ICD-10-CM

## 2020-06-12 ENCOUNTER — Ambulatory Visit (INDEPENDENT_AMBULATORY_CARE_PROVIDER_SITE_OTHER): Payer: BC Managed Care – PPO | Admitting: Psychology

## 2020-06-12 ENCOUNTER — Ambulatory Visit: Payer: BC Managed Care – PPO | Admitting: Psychology

## 2020-06-12 ENCOUNTER — Other Ambulatory Visit: Payer: Self-pay

## 2020-06-12 ENCOUNTER — Encounter: Payer: Self-pay | Admitting: Psychology

## 2020-06-12 DIAGNOSIS — R4189 Other symptoms and signs involving cognitive functions and awareness: Secondary | ICD-10-CM

## 2020-06-12 DIAGNOSIS — G3184 Mild cognitive impairment, so stated: Secondary | ICD-10-CM

## 2020-06-12 DIAGNOSIS — R2990 Unspecified symptoms and signs involving the nervous system: Secondary | ICD-10-CM | POA: Insufficient documentation

## 2020-06-12 DIAGNOSIS — Z8616 Personal history of COVID-19: Secondary | ICD-10-CM

## 2020-06-12 DIAGNOSIS — U099 Post covid-19 condition, unspecified: Secondary | ICD-10-CM | POA: Insufficient documentation

## 2020-06-12 HISTORY — DX: Mild cognitive impairment of uncertain or unknown etiology: G31.84

## 2020-06-12 NOTE — Progress Notes (Signed)
   Psychometrician Note   Cognitive testing was administered to Casey Hunter. by Wallace Keller, B.S. (psychometrist) under the supervision of Casey Hunter, Ph.D., licensed psychologist on 06/12/20. Casey Hunter did not appear overtly distressed by the testing session per behavioral observation or responses across self-report questionnaires. Casey Hunter, Ph.D. checked in with Casey Hunter as needed to manage any distress related to testing procedures (if applicable). Rest breaks were offered.    The battery of tests administered was selected by Casey Hunter, Ph.D. with consideration to Casey Hunter current level of functioning, the nature of his symptoms, emotional and behavioral responses during interview, level of literacy, observed level of motivation/effort, and the nature of the referral question. This battery was communicated to the psychometrist. Communication between Casey Hunter, Ph.D. and the psychometrist was ongoing throughout the evaluation and Casey Hunter, Ph.D. was immediately accessible at all times. Casey Hunter, Ph.D. provided supervision to the psychometrist on the date of this service to the extent necessary to assure the quality of all services provided.    Casey Hunter. will return within approximately 1-2 weeks for an interactive feedback session with Casey Hunter at which time his test performances, clinical impressions, and treatment recommendations will be reviewed in detail. Casey Hunter understands he can contact our office should he require our assistance before this time.  A total of 160 minutes of billable time were spent face-to-face with Casey Hunter by the psychometrist. This includes both test administration and scoring time. Billing for these services is reflected in the clinical report generated by Casey Hunter, Ph.D..  This note reflects time spent with the psychometrician and does not include test scores or any  clinical interpretations made by Casey Hunter. The full report will follow in a separate note.

## 2020-06-12 NOTE — Patient Instructions (Signed)
Clinical Impression(s): During testing, frustration tolerance was poor at times and scores across stand-alone and embedded performance validity measures were variable. As such, the results of the current evaluation should be interpreted with a mild degree of caution. However, if taken at face value, Casey Hunter pattern of performance is suggestive of primary deficits surrounding processing speed, verbal fluency, and executive functioning (namely response inhibition and adaptability). Performance variability was also noted across attention/concentration, as well as memory measures. Regarding the latter, a relative weakness was exhibited across a shape learning task relative to generally intact verbal memory tasks. Performance was appropriate across visuomotor cognitive flexibility, receptive language, confrontation naming (functionally rather than normatively), and visuospatial abilities. Casey Hunter generally denied difficulties completing instrumental activities of daily living (ADLs) independently. As such, given evidence for cognitive dysfunction described above, he meets criteria for a Mild Neurocognitive Disorder (formerly "mild cognitive impairment") at the present time.  The etiology of these weaknesses is unclear. His pattern of performance is consistent with what would be expected with a primary vascular etiology and he has several cardiovascular conditions (i.e., hypertension, hyperlipidemia, diabetes) in his medical history. Neuroimaging was unavailable to characterize the likely presence and/or extent of small vessel ischemic changes. However, it is possible that vascular etiology represents the primary cause for cognitive changes and that these weaknesses were present but not recognized prior to his COVID-19 diagnosis. It is also possible that the mechanisms of the COVID-19 virus could have directly exacerbated prior vascular-based weaknesses (given that worst cases of COVID-19 have been associated  with diabetes and other cardiovascular conditions). However, the latter point is purely speculative at this time. At the very least, it is reasonable to assume that cognitive weaknesses have been exacerbated by common symptoms associated with COVID-19, including physical changes, increased stress, and likely physical deconditioning. Mood-related concerns (including depression) would also exacerbate these weaknesses. While Casey Hunter refused to complete a depression questionnaire, he did endorse several symptoms which are common in this condition (e.g., increase agitation and frustration, tendency to withdraw or isolate, desire to be left alone, increased introversion). Current performance does not suggest patterns consistent with more common types of neurodegenerative illness, including Alzheimer's disease or Lewy body dementia. Continued medical monitoring will be important moving forward.

## 2020-06-12 NOTE — Progress Notes (Addendum)
NEUROPSYCHOLOGICAL EVALUATION [place] Department of Neurology  Date of Evaluation: June 12, 2020  Reason for Referral:   Casey Hunter. is a 64 y.o. right-handed Caucasian male referred by Angus Seller, NP, to characterize his current cognitive functioning and assist with diagnostic clarity and treatment planning in the context of subjective cognitive decline following COVID-19.   Assessment and Plan:   Clinical Impression(s): During testing, frustration tolerance was poor at times and scores across stand-alone and embedded performance validity measures were variable. As such, the results of the current evaluation should be interpreted with a mild degree of caution. However, if taken at face value, Casey Hunter pattern of performance is suggestive of primary deficits surrounding processing speed, verbal fluency, and executive functioning (namely response inhibition and adaptability). Performance variability was also noted across attention/concentration, as well as memory measures. Regarding the latter, a relative weakness was exhibited across a shape learning task relative to generally intact verbal memory tasks. Performance was appropriate across visuomotor cognitive flexibility, receptive language, confrontation naming (functionally rather than normatively), and visuospatial abilities. Casey Hunter generally denied difficulties completing instrumental activities of daily living (ADLs) independently. As such, given evidence for cognitive dysfunction described above, he meets criteria for a Mild Neurocognitive Disorder (formerly "mild cognitive impairment") at the present time.  The etiology of these weaknesses is unclear. His pattern of performance is consistent with what would be expected with a primary vascular etiology and he has several conditions in his medical history (i.e., hypertension, hyperlipidemia, diabetes) which can influence cerebrovascular  functioning. Neuroimaging was unavailable to characterize the likely presence and/or extent of small vessel ischemic changes. It is possible that a vascular etiology represents the primary cause for cognitive changes and that these weaknesses were present but not recognized prior to his COVID-19 diagnosis. However, Casey Hunter was quite adamant that he has experienced substantial changes in his cognitive functioning since having COVID-19. It is possible that the mechanisms of the COVID-19 virus could have directly exacerbated prior vascular-based weaknesses (given that worst cases of COVID-19 have been associated with diabetes and other cardiovascular conditions). It is also possible that these changes are directly related to COVID-19 due to mechanisms (e.g., prolonged diminished oxygen saturation) that have not yet been uncovered due to the lack of longitudinal cognitive data. At the very least, it is reasonable to assume that cognitive weaknesses have been exacerbated by common symptoms associated with COVID-19, including physical changes, increased stress, and likely physical deconditioning. Current performance does not suggest patterns consistent with more common types of neurodegenerative illness, including Alzheimer's disease or Lewy body dementia. Continued medical monitoring will be important moving forward.  Recommendations: A repeat neuropsychological evaluation in 24-36 months (or sooner if functional decline is noted) is recommended to assess the trajectory of future cognitive decline should it occur. This will also aid in future efforts towards improved diagnostic clarity.  Unless there is a factor unknown to me which would prevent neuroimaging, obtaining a brain MRI would be beneficial to assess vascular changes in the brain and look for anatomical correlates for cognitive weakness.   While Casey Hunter denied a history of sleep apnea, his medical records suggest the opposite. If sleep apnea is  present and untreated, this condition would certainly contribute to ongoing cognitive weaknesses and likely worsen a vascular etiology. Untreated sleep apnea would put him at an increased risk for stroke, heart attack, and future cognitive decline. An updated laboratory sleep study may be warranted.  A combination  of medication and psychotherapy has been shown to be most effective at treating symptoms of anxiety and depression. As such, Casey Hunter is encouraged to speak with his prescribing physician regarding medication adjustments to optimally manage these symptoms. He could also consider engaging in short-term psychotherapy to address symptoms of psychiatric distress. He would benefit from an active and collaborative therapeutic environment, rather than one purely supportive in nature. Recommended treatment modalities include Cognitive Behavioral Therapy (CBT) or Acceptance and Commitment Therapy (ACT).  Casey Hunter is encouraged to attend to lifestyle factors for brain health (e.g., regular physical exercise, good nutrition habits, regular participation in cognitively-stimulating activities, and general stress management techniques), which are likely to have benefits for both emotional adjustment and cognition. In fact, in addition to promoting good general health, regular exercise incorporating aerobic activities (e.g., brisk walking, jogging, cycling, etc.) has been demonstrated to be a very effective treatment for depression and stress, with similar efficacy rates to both antidepressant medication and psychotherapy. Optimal control of vascular risk factors (including safe cardiovascular exercise and adherence to dietary recommendations) is encouraged.  When learning new information, he would benefit from information being broken up into small, manageable pieces. He may also find it helpful to articulate the material in his own words and in a context to promote encoding at the onset of a new task. This  material may need to be repeated multiple times to promote encoding.  Memory can be improved using internal strategies such as rehearsal, repetition, chunking, mnemonics, association, and imagery. External strategies such as written notes in a consistently used memory journal, visual and nonverbal auditory cues such as a calendar on the refrigerator or appointments with alarm, such as on a cell phone, can also help maximize recall.    To address problems with processing speed, he may wish to consider:   -Ensuring that he is alerted when essential material or instructions are being presented   -Adjusting the speed at which new information is presented   -Allowing for more time in comprehending, processing, and responding in conversation  To address problems with fluctuating attention and executive dysfunction, he may wish to consider:   -Avoiding external distractions when needing to concentrate   -Limiting exposure to fast paced environments with multiple sensory demands   -Writing down complicated information and using checklists   -Attempting and completing one task at a time (i.e., no multi-tasking)   -Verbalizing aloud each step of a task to maintain focus   -Reducing the amount of information considered at one time  Review of Records:   Mr. Borquez was seen by [facility] Sula Soda, M.D.) on 05/23/2020 for follow-up of left leg numbness, diffuse joint pain, and fatigue following COVID-19 infection. He also reported vertigo episodes when he works outside. 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 Dizziness was said to have started approximately six weeks prior and is concerning. He reported two falls since his last appointment. He was checking his blood pressure when it was very hot outside; readings continued to be low and he continued to have symptoms of dizziness. His blood sugars have also been difficult to control since  contracting COVID-19. They were noted to have arisen to dangerous levels after receiving corticosteroids in the past. When dizzy, he described a sensation of his head spinning rather than the world around him spinning. He denied feeling his vision blackening. He was referred to rheumatology; RA was <10 and CRP was normal.  In addition to physical symptoms, he also reported ongoing cognitive deficits, namely surrounding memory deficits and brain fog. He was prescribed methylphenidate but did not report significant benefits. Ultimately, Mr. Sedor was referred for a comprehensive neuropsychological evaluation to characterize his cognitive abilities and to assist with diagnostic clarity and treatment planning.   No neuroimaging was available for review.  Past Medical History:  Diagnosis Date  . Diabetic peripheral neuropathy associated with type 2 diabetes mellitus 03/14/2017  . DOE (dyspnea on exertion) 11/2019   Onset jan 2021 with covid 19  - assoc with atypical cp developed during the infection present mostly sitting/ absent supine/ better with deep breathing/ no worse with ex - 01/30/2020   Walked RA x two laps =  approx 569ft @ brisk pace - stopped due to end of study, no sob/ no change cp  with sats of 93 % at the end of the study and no acute ekg changes  - Echo  02/13/2020  wnl with  no pericadial ef  . Fatigue 11/2019  . History of COVID-19 11/2019  . Hyperlipidemia associated with type 2 diabetes mellitus 09/16/2015  . Hypertension associated with type 2 diabetes mellitus 09/16/2015  . Knee osteoarthritis 04/14/2016  . Numbness of lower extremity 11/2019   Attributed to COVID-19  . Sleep apnea    Patient denied being diagnosed with OSA in the past    Past Surgical History:  Procedure Laterality Date  . HERNIA REPAIR      Current Outpatient Medications:  .  acetaminophen (TYLENOL) 650 MG CR tablet, Take 1,300 mg by mouth in the morning and at bedtime., Disp: , Rfl:  .  amLODipine (NORVASC)  2.5 MG tablet, Take 1 tablet (2.5 mg total) by mouth daily., Disp: 30 tablet, Rfl: 3 .  amLODipine (NORVASC) 5 MG tablet, Take 1 tablet (5 mg total) by mouth daily., Disp: 90 tablet, Rfl: 1 .  aspirin 81 MG tablet, Take 81 mg by mouth daily., Disp: , Rfl:  .  colesevelam (WELCHOL) 625 MG tablet, Take 3 tablets (1,875 mg total) by mouth 2 (two) times daily with a meal. Takes 3 tables twice daily, Disp: 540 tablet, Rfl: 4 .  Continuous Blood Gluc Receiver (FREESTYLE LIBRE 14 DAY READER) DEVI, USE AS DIRECTED, Disp: 1 Device, Rfl: 12 .  Continuous Blood Gluc Sensor (FREESTYLE LIBRE 14 DAY SENSOR) MISC, INJECT 1 INTO THE SKIN **SEE ADMIN INSTRUCTIONS**, Disp: 2 each, Rfl: 12 .  cyclobenzaprine (FLEXERIL) 10 MG tablet, Take 1 tablet (10 mg total) by mouth at bedtime., Disp: 30 tablet, Rfl: 0 .  ezetimibe (ZETIA) 10 MG tablet, Take 1 tablet (10 mg total) by mouth daily., Disp: 90 tablet, Rfl: 4 .  FARXIGA 10 MG TABS tablet, TAKE 1 TABLET BY MOUTH DAILY, Disp: 90 tablet, Rfl: 0 .  lovastatin (MEVACOR) 20 MG tablet, Take 1 tablet (20 mg total) by mouth at bedtime., Disp: 90 tablet, Rfl: 4 .  meclizine (ANTIVERT) 12.5 MG tablet, Take 1 tablet (12.5 mg total) by mouth 3 (three) times daily as needed for dizziness., Disp: 30 tablet, Rfl: 0 .  meloxicam (MOBIC) 15 MG tablet, Take 1 tablet (15 mg total) by mouth daily., Disp: 90 tablet, Rfl: 4 .  metFORMIN (GLUCOPHAGE) 1000 MG tablet, Take 1 tablet (1,000 mg total) by mouth 2 (two) times daily with a meal., Disp: 180 tablet, Rfl: 4 .  methylphenidate (RITALIN) 5 MG tablet, Take 2 tablets (10 mg total) by mouth daily as needed., Disp: 60 tablet, Rfl:  0 .  niacin (NIASPAN) 1000 MG CR tablet, Take 1 tablet (1,000 mg total) by mouth at bedtime., Disp: 90 tablet, Rfl: 4 .  olmesartan-hydrochlorothiazide (BENICAR HCT) 40-25 MG tablet, TAKE 1 TABLET DAILY., Disp: 30 tablet, Rfl: 5 .  pregabalin (LYRICA) 50 MG capsule, Take 1 capsule (50 mg total) by mouth 3 (three)  times daily., Disp: 270 capsule, Rfl: 1 .  sildenafil (REVATIO) 20 MG tablet, Take 1 tablet (20 mg total) by mouth as needed. 1-5 prn, Disp: 50 tablet, Rfl: 12 .  TRULICITY 3 MG/0.5ML SOPN, INJECT 3 MG INTO THE SKIN ONCE A WEEK., Disp: 12 pen, Rfl: 1 .  Vitamin D, Ergocalciferol, (DRISDOL) 1.25 MG (50000 UNIT) CAPS capsule, Take 1 capsule (50,000 Units total) by mouth every 7 (seven) days., Disp: 3 capsule, Rfl: 0  Current Facility-Administered Medications:  .  cyanocobalamin ((VITAMIN B-12)) injection 1,000 mcg, 1,000 mcg, Intramuscular, Weekly, Johnson, Megan P, DO, 1,000 mcg at 06/06/20 0806 .  [START ON 06/20/2020] cyanocobalamin ((VITAMIN B-12)) injection 1,000 mcg, 1,000 mcg, Intramuscular, Q30 days, Johnson, Megan P, DO  Clinical Interview:   Cognitive Symptoms: Decreased short-term memory: Endorsed. He reported frequently losing his train of thought. He also reported misplacing things around his home, trouble remembering the details of previous conversations, and an increased reliance on calendars and alarms set on his phone. Deficits were said to first emerge after contracting COVID-19 in January 2021. No deficits were reported prior to this event.  Decreased long-term memory: Denied. Decreased attention/concentration: Endorsed. He reported trouble sustaining his attention, as well as increased distractibility. He noted being prescribed Ritalin to address these symptoms in the recent past. However, he was unsure if this had led to any observable improvement.  Reduced processing speed: Endorsed. He was unclear if his experience represented slowed processing or brain fog, ultimately stated that his mind "is not clear."  Difficulties with executive functions: Endorsed. He described increased difficulties with organizing and complex planning. The former represents a significant change as he reported being described as a "mild to moderate perfectionist" by a medical professional in the past.  Trouble with impulsivity or poor judgement was denied. Behavioral disinhibition was also denied.  Difficulties with emotion regulation: Denied. Difficulties with receptive language: Denied. Difficulties with word finding: Endorsed. Decreased visuoperceptual ability: Denied.  Trajectory of deficits: All subjective cognitive deficits were said to emerge following him contracting COVID-19 in January 2021.   Difficulties completing ADLs: Somewhat. He largely denied difficulties with medication management or driving. He acknowledged some difficulties remembering to pay bills, noting that he has misplaced these and is unclear where they currently are.   Additional Medical History: History of traumatic brain injury/concussion: Denied. History of stroke: Denied. History of seizure activity: Denied. History of known exposure to toxins: Denied. Symptoms of chronic pain: Endorsed. When asked about pain symptoms, he described the presence of numbness in his lower extremities. This was said to first emerge after being diagnosed with COVID-19 and originally impacted both legs. He described complete recovery in his right leg, but his left leg continues to experience numbness and weakness. Prior medical records suggest Mr. Reap describing his pain levels as a 6-7 out of 10.  Experience of frequent headaches/migraines: Denied. Frequent instances of dizziness/vertigo: Endorsed. Symptoms of dizziness were largely attributed to issues with low blood pressure, especially when exposed to extreme heat. While previously working, he reported feeling very dizzy during days where the temperature was relatively high. He checked his blood pressure during these days and found  it to be quite low. This was said to contribute to falls in the past.   Sensory changes: He wears glasses with positive effect. Other sensory changes/difficulties (e.g., hearing, taste, or smell) were denied.  Balance/coordination difficulties: Largely  denied currently. However, he did acknowledge that persistent numbness in his left leg does negatively impact his balance and has led to falls in the past.  Other motor difficulties: Denied.  Sleep History: Estimated hours obtained each night: 8-10 hours. He noted that prior to being diagnosed with COVID-19, he would operate well after getting 4-5 hours of sleep.  Difficulties falling asleep: Denied. Difficulties staying asleep: Denied. He reported waking to use the restroom throughout the night but is generally able to fall back asleep quickly.  Feels rested and refreshed upon awakening: Endorsed.  History of snoring: Denied. History of waking up gasping for air: Denied. Witnessed breath cessation while asleep: Denied. While [name] denied a history of sleep apnea and did not endorse any ongoing CPAP treatment, medical records do suggest a history of sleep apnea. It is unclear when or by whom this diagnosis was rendered.   History of vivid dreaming: Denied. Excessive movement while asleep: Denied. Instances of acting out his dreams: Denied.  Psychiatric/Behavioral Health History: Depression: He denied to his knowledge ever being diagnosed with a mental health condition such as depression in the past. However, acutely, he did describe his mood as "not normal," noting that he feels more "agitated and aggravated" than what is typical for him. He described that these feelings were more directed at his current situation and ongoing limitations rather than directed at any individual or group of individuals. He also reported an increased desire to be left alone and is less outgoing than he typically is. Current or remote suicidal ideation, intent, or plan was denied.  Anxiety: Denied. Mania: Denied. Trauma History: Denied. Visual/auditory hallucinations: Denied. Delusional thoughts: Denied.  Tobacco: Denied. Alcohol: He denied current alcohol consumption as well as a history of problematic  alcohol abuse or dependence.  Recreational drugs: Denied. Caffeine: Denied outside of consuming unsweetened tea.   Family History: Problem Relation Age of Onset  . Hypertension Mother   . Hyperlipidemia Mother   . Hyperlipidemia Father    This information was confirmed by [name].  Academic/Vocational History: Highest level of educational attainment: 12 years. 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 Relative weaknesses across academic subjects were denied.  History of developmental delay: Denied. History of grade repetition: Denied. Enrollment in special education courses: Denied. History of LD/ADHD: Denied.  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 After contracting COVID-19, he was held out of work until [employer]April where he returned 3 days per week. By the end of his third day, he reported feeling noted fatigue. When symptoms of dizziness increased due to rising temperatures, he was again taken out of work and has not yet returned.  Evaluation Results:   Behavioral Observations: [name] was unaccompanied, arrived to his appointment on time, and was appropriately dressed and groomed. He appeared alert and oriented. He ambulated somewhat slowly but observed gait and station were otherwise within normal limits. Gross motor functioning appeared intact upon informal observation and no abnormal movements (e.g., tremors) were noted. His affect was generally relaxed and positive, but did range appropriately given the subject being discussed during the clinical interview or the task at hand during testing procedures. Spontaneous speech was  fluent and word finding difficulties were not observed during the clinical interview. Thought processes were coherent, organized, and normal in content. Insight into his cognitive difficulties appeared adequate. During testing, he  expressed frustration surrounding his belief that his appointment had to do with numbness in his legs rather than cognitive testing. Sustained attention was appropriate. Task engagement was adequate. He appeared to get agitated and would often ask the psychometrist what the point of each task was and why it was being administered to him. During TMT A, he focused on drawing very straight lines rather than going quickly. When speed was emphasized to him, he stated "this is the way I was taught." He also appeared to give up easily across certain tasks (e.g., COWAT) and refused to complete a depression questionnaire. Of note, at the conclusion of testing, he spontaneously apologized to the psychometrist stating that he felt he may have giver her "a hard time."  Adequacy of Effort: The validity of neuropsychological testing is limited by the extent to which the individual being tested may be assumed to have exerted adequate effort during testing. Mr. Yepes expressed his intention to perform to the best of his abilities and exhibited adequate task engagement and persistence. Scores across stand-alone and embedded performance validity measures were variable. As such, the results of the current evaluation should be interpreted with a mild degree of caution.  Test Results: Mr. Kirk was largely oriented at the time of the current evaluation.  Intellectual abilities based upon educational and vocational attainment were estimated to be in the average range. Premorbid abilities were estimated to be within the below average range based upon a single-word reading test.   Processing speed was exceptionally low to below average. Basic attention was below average. More complex attention (e.g., working memory) was average. Executive functioning was variable. Visuomotor cognitive flexibility was average, response inhibition was exceptionally low, and problem solving/adaptability was largely well below average to below  average.  Assessed receptive language abilities were above average. Assessed expressive language was somewhat variable. Phonemic fluency was exceptionally low, semantic fluency was well below average, and confrontation naming was well below average. However, regarding the latter, this ability is believed to be functionally intact as he was able to provide appropriate substitutes for incorrect words (e.g., baby bed for crib).    Assessed visuospatial/visuoconstructional abilities were average.    Learning (i.e., encoding) of novel verbal and visual information was well below average to average. Spontaneous delayed recall (i.e., retrieval) of previously learned information was also well below average to average. Retention rates were 81% across a story learning task, 75% across a list learning task, and 50% across a shape learning task. Performance across recognition tasks was exceptionally low to well below average, suggesting limited evidence for information consolidation.   Results of emotional screening instruments suggested that recent symptoms of generalized anxiety were in the mild range. He refused to complete a depression questionnaire. A screening instrument assessing recent sleep quality suggested the presence of minimal sleep dysfunction.  Tables of Scores:   Note: This summary of test scores accompanies the interpretive report and should not be considered in isolation without reference to the appropriate sections in the text. Descriptors are based on appropriate normative data and may be adjusted based on clinical judgment. The terms "impaired" and "within normal limits (WNL)" are used when a more specific level of functioning cannot be determined.       Effort Testing:   DESCRIPTOR       ACS  Word Choice: --- --- Within Expectation  NAB EVI: --- --- Below Expectation  D-KEFS Color Word Effort Index: --- --- Below Expectation       Orientation:      Raw Score Percentile   NAB  Orientation, Form 1 28/29 --- ---       Intellectual Functioning:           Standard Score Percentile   Test of Premorbid Functioning: 81 10 Below Average       Memory:          NAB Memory Module, Form 2: Standard Score/ T Score Percentile   Total Memory Index 77 6 Well Below Average  List Learning       Total Trials 1-3 14/36 (37) 9 Below Average    List B 6/12 (65) 93 Well Above Average    Short Delay Free Recall 4/12 (38) 12 Below Average    Long Delay Free Recall 3/12 (37) 9 Below Average    Retention Percentage 75 (44) 27 Average    Recognition Discriminability 3 (31) 3 Well Below Average  Shape Learning       Total Trials 1-3 9/27 (35) 7 Well Below Average    Delayed Recall 2/9 (30) 2 Well Below Average    Retention Percentage 50 (36) 8 Well Below Average    Recognition Discriminability 2 (26) 1 Exceptionally Low  Story Learning       Immediate Recall 40/80 (48) 42 Average    Delayed Recall 17/40 (43) 25 Average    Retention Percentage 81 (47) 38 Average  Daily Living Memory       Immediate Recall 36/51 (41) 18 Below Average    Delayed Recall 10/17 (40) 16 Below Average    Retention Percentage 67 (35) 7 Well Below Average    Recognition Hits 5/10 (16) <1 Exceptionally Low       Attention/Executive Function:          Trail Making Test (TMT): Raw Score (T Score) Percentile     Part A 53 secs.,  0 errors (33) 5 Well Below Average    Part B 98 secs.,  0 errors (46) 34 Average         Scaled Score Percentile   WAIS-IV Coding: 4 2 Well Below Average       NAB Attention Module, Form 1: T Score Percentile     Digits Forward 41 18 Below Average    Digits Backwards 50 50 Average       D-KEFS Color-Word Interference Test: Raw Score (Scaled Score) Percentile     Color Naming 41 secs. (6) 9 Below Average    Word Reading 40 secs. (2) <1 Exceptionally Low    Inhibition 115 secs. (2) <1 Exceptionally Low      Total Errors 2 errors (10) 50 Average    Inhibition/Switching  148 secs. (1) <1 Exceptionally Low      Total Errors 8 errors (5) 5 Well Below Average       [place] Card Sorting Test: Raw Score Percentile     Categories (trials) 0 (64) 2-5 Well Below Average    Total Errors 26 16 Below Average    Perseverative Errors 6 75 Above Average    Non-Perseverative Errors 20 2 Well Below Average    Failure to Maintain Set 3 --- ---       Language:          Verbal Fluency Test: Raw Score (T Score) Percentile  Phonemic Fluency (FAS) 13 (26) 1 Exceptionally Low    Animal Fluency 12 (35) 7 Well Below Average             NAB Language Module, Form 1: T Score Percentile     Auditory Comprehension 58 79 Above Average    Naming 26/31 (30) 2 Well Below Average       Visuospatial/Visuoconstruction:      Raw Score Percentile   Clock Drawing: 9/10 --- Within Normal Limits       NAB Spatial Module, Form 1: T Score Percentile     Figure Drawing Copy 50 50 Average        Scaled Score Percentile   WAIS-IV Block Design: 8 25 Average       Mood and Personality:      Raw Score Percentile   Geriatric Depression Scale: Pt. refused --- ---  Geriatric Anxiety Scale: 21 --- Mild    Somatic 5 --- Minimal    Cognitive 7 --- Moderate    Affective 9 --- Severe       Additional Questionnaires:      Raw Score Percentile   PROMIS Sleep Disturbance Questionnaire: 9 --- None to Slight   Informed Consent and Coding/Compliance:   Mr. Sickinger was provided with a verbal description of the nature and purpose of the present neuropsychological evaluation. Also reviewed were the foreseeable risks and/or discomforts and benefits of the procedure, limits of confidentiality, and mandatory reporting requirements of this provider. The patient was given the opportunity to ask questions and receive answers about the evaluation. Oral consent to participate was provided by the patient.   This evaluation was conducted by Newman Nickels, Ph.D., licensed clinical neuropsychologist. Mr.  Kneisel completed a comprehensive clinical interview with Dr. Milbert Coulter, billed as one unit [phone], and 160 minutes of cognitive testing and scoring, billed as one unit [phone] and four additional units [id number]. Psychometrist Wallace Keller, B.S., assisted Dr. Milbert Coulter with test administration and scoring procedures. As a separate and discrete service, Dr. Milbert Coulter spent a total of 160 minutes in interpretation and report writing billed as one unit [phone] and two units [id number].

## 2020-06-13 ENCOUNTER — Ambulatory Visit (INDEPENDENT_AMBULATORY_CARE_PROVIDER_SITE_OTHER): Payer: BC Managed Care – PPO

## 2020-06-13 DIAGNOSIS — E538 Deficiency of other specified B group vitamins: Secondary | ICD-10-CM | POA: Diagnosis not present

## 2020-06-19 ENCOUNTER — Other Ambulatory Visit: Payer: Self-pay

## 2020-06-19 ENCOUNTER — Ambulatory Visit (INDEPENDENT_AMBULATORY_CARE_PROVIDER_SITE_OTHER): Payer: BC Managed Care – PPO | Admitting: Psychology

## 2020-06-19 DIAGNOSIS — G3184 Mild cognitive impairment, so stated: Secondary | ICD-10-CM | POA: Diagnosis not present

## 2020-06-19 DIAGNOSIS — Z8616 Personal history of COVID-19: Secondary | ICD-10-CM

## 2020-06-19 NOTE — Patient Instructions (Signed)
Recommendations: A repeat neuropsychological evaluation in 24-36 months (or sooner if functional decline is noted) is recommended to assess the trajectory of future cognitive decline should it occur. This will also aid in future efforts towards improved diagnostic clarity.  Unless there is a factor unknown to me which would prevent neuroimaging, obtaining a brain MRI would be beneficial to assess vascular changes in the brain and look for anatomical correlates for cognitive weakness.   While Mr. Wenke denied a history of sleep apnea, his medical records suggest the opposite. If sleep apnea is present and untreated, this condition would certainly contribute to ongoing cognitive weaknesses and likely worsen a vascular etiology. Untreated sleep apnea would put him at an increased risk for stroke, heart attack, and future cognitive decline. An updated laboratory sleep study may be warranted.  A combination of medication and psychotherapy has been shown to be most effective at treating symptoms of anxiety and depression. As such, Mr. Postlewait is encouraged to speak with his prescribing physician regarding medication adjustments to optimally manage these symptoms. He could also consider engaging in short-term psychotherapy to address symptoms of psychiatric distress. He would benefit from an active and collaborative therapeutic environment, rather than one purely supportive in nature. Recommended treatment modalities include Cognitive Behavioral Therapy (CBT) or Acceptance and Commitment Therapy (ACT).  Mr. Wiseman is encouraged to attend to lifestyle factors for brain health (e.g., regular physical exercise, good nutrition habits, regular participation in cognitively-stimulating activities, and general stress management techniques), which are likely to have benefits for both emotional adjustment and cognition. In fact, in addition to promoting good general health, regular exercise incorporating aerobic  activities (e.g., brisk walking, jogging, cycling, etc.) has been demonstrated to be a very effective treatment for depression and stress, with similar efficacy rates to both antidepressant medication and psychotherapy. Optimal control of vascular risk factors (including safe cardiovascular exercise and adherence to dietary recommendations) is encouraged.  When learning new information, he would benefit from information being broken up into small, manageable pieces. He may also find it helpful to articulate the material in his own words and in a context to promote encoding at the onset of a new task. This material may need to be repeated multiple times to promote encoding.  Memory can be improved using internal strategies such as rehearsal, repetition, chunking, mnemonics, association, and imagery. External strategies such as written notes in a consistently used memory journal, visual and nonverbal auditory cues such as a calendar on the refrigerator or appointments with alarm, such as on a cell phone, can also help maximize recall.    To address problems with processing speed, he may wish to consider:   -Ensuring that he is alerted when essential material or instructions are being presented   -Adjusting the speed at which new information is presented   -Allowing for more time in comprehending, processing, and responding in conversation  To address problems with fluctuating attention and executive dysfunction, he may wish to consider:   -Avoiding external distractions when needing to concentrate   -Limiting exposure to fast paced environments with multiple sensory demands   -Writing down complicated information and using checklists   -Attempting and completing one task at a time (i.e., no multi-tasking)   -Verbalizing aloud each step of a task to maintain focus   -Reducing the amount of information considered at one time

## 2020-06-19 NOTE — Progress Notes (Signed)
   Neuropsychology Feedback Session Casey Hunter. [facility] [place] Department of Neurology  Reason for Referral:   Casey Hunteris a 64 y.o. right-handed Caucasian male referred by Angus Seller, NP,to characterize hiscurrent cognitive functioning and assist with diagnostic clarity and treatment planning in the context of subjective cognitive decline following COVID-19.   Feedback:   Casey Hunter completed a comprehensive neuropsychological evaluation on 06/12/2020. Please refer to that encounter for the full report and recommendations. Briefly, results suggested primary deficits surrounding processing speed, verbal fluency, and executive functioning (namely response inhibition and adaptability). Performance variability was also noted across attention/concentration, as well as memory measures. Regarding the latter, a relative weakness was exhibited across a shape learning task relative to generally intact verbal memory tasks. The etiology of these weaknesses is unclear. His pattern of performance is consistent with what would be expected with a primary vascular etiology and he has several conditions in his medical history (i.e., hypertension, hyperlipidemia, diabetes) which can influence cerebrovascular functioning. Neuroimaging was unavailable to characterize the likely presence and/or extent of small vessel ischemic changes. It is possible that a vascular etiology represents the primary cause for cognitive changes and that these weaknesses were present but not recognized prior to his COVID-19 diagnosis. However, Casey Hunter was quite adamant that he has experienced substantial changes in his cognitive functioning since having COVID-19. It is possible that the mechanisms of the COVID-19 virus could have directly exacerbated prior vascular-based weaknesses (given that worst cases of COVID-19 have been associated with diabetes and other cardiovascular conditions). It is also possible that  these changes are directly related to COVID-19 due to mechanisms (e.g., prolonged diminished oxygen saturation) that have not yet been uncovered due to the lack of longitudinal cognitive data.  Casey Hunter was unaccompanied during the current telephone call. He was within his residence while I was within my office. Content of the current session focused on the results of his neuropsychological evaluation. Casey Hunter was given the opportunity to ask questions and his questions were answered. Minor changes were made to his original report in an addendum based upon our conversation. As such, his report reflecting changes signed on 06/19/2020 reflect the updated version of his report. He was encouraged to reach out should additional questions arise. A copy of his report was mailed at the conclusion of the visit.      31 minutes were spent conducting the current feedback session with Casey Hunter, billed as one unit [phone].

## 2020-06-26 ENCOUNTER — Ambulatory Visit (INDEPENDENT_AMBULATORY_CARE_PROVIDER_SITE_OTHER): Payer: BC Managed Care – PPO | Admitting: Nurse Practitioner

## 2020-06-26 DIAGNOSIS — Z8616 Personal history of COVID-19: Secondary | ICD-10-CM | POA: Diagnosis not present

## 2020-06-26 NOTE — Progress Notes (Signed)
@Patient  ID: ., male    DOB: [birth year], 64 y.o.   MRN: [id number]  Chief Complaint  Patient presents with  . Follow-up    Still having brain fog, fatigue and left leg falls alseep alot    Referring provider: [id number], DO   64 year old male with history of hypertension, diabetes, hyperlipidemia, psoriasis, sleep apnea.  Patient was diagnosed with Covid in January 2021.  HPI  Patient presents today for post Covid care clinic visit.  Patient was diagnosed with Covid in January and did receive monoclonal antibody infusion. He was never hospitalized.   Since his last visit here patient has been seen by neuropsychology for memory loss and brain fog and underwent cognitive testing. He was found to have mild cognitive impairment. Advised on lifestyle changes and encouraged follow up in 2 years.   Patient did also follow up with PMR. He was prescribed methylphenidate to increase energy and maybe help with brain fog. Patient states that this has not helped.   Patient did not follow up with rheumatology as advised. He states that he has too many Dr. Visits scheduled.  Denies f/c/s, n/v/d, hemoptysis, PND, chest pain or edema.      Allergies  Allergen Reactions  . Crestor [Rosuvastatin Calcium]     Myalgias  . Lipitor [Atorvastatin]     Myalgia  . Penicillin G Potassium [Penicillin G]   . Sulfur     Immunization History  Administered Date(s) Administered  . Influenza,inj,Quad PF,6+ Mos 09/17/2015, 09/21/2017  . Influenza-Unspecified 10/09/2016, 09/17/2018, 09/23/2019  . Moderna SARS-COVID-2 Vaccination 03/13/2020, 04/10/2020  . Pneumococcal Polysaccharide-23 12/05/2006  . Td 05/13/2005  . Tdap 01/08/2016    Past Medical History:  Diagnosis Date  . Diabetic peripheral neuropathy associated with type 2 diabetes mellitus 03/14/2017  . DOE (dyspnea on exertion) 11/2019   Onset jan 2021 with covid 19  - assoc with atypical cp developed during the  infection present mostly sitting/ absent supine/ better with deep breathing/ no worse with ex - 01/30/2020   Walked RA x two laps =  approx 542ft @ brisk pace - stopped due to end of study, no sob/ no change cp  with sats of 93 % at the end of the study and no acute ekg changes  - Echo  02/13/2020  wnl with  no pericadial ef  . Fatigue 11/2019  . History of COVID-19 11/2019  . Hyperlipidemia associated with type 2 diabetes mellitus 09/16/2015  . Hypertension associated with type 2 diabetes mellitus 09/16/2015  . Knee osteoarthritis 04/14/2016  . Mild neurocognitive disorder 06/12/2020  . Numbness of lower extremity 11/2019   Attributed to COVID-19  . Sleep apnea    Patient denied being diagnosed with OSA in the past    Tobacco History: Social History   Tobacco Use  Smoking Status Never Smoker  Smokeless Tobacco Never Used   Counseling given: Not Answered   Outpatient Encounter Medications as of 06/26/2020  Medication Sig  . acetaminophen (TYLENOL) 650 MG CR tablet Take 1,300 mg by mouth in the morning and at bedtime.  08/26/2020 amLODipine (NORVASC) 2.5 MG tablet Take 1 tablet (2.5 mg total) by mouth daily.  Marland Kitchen amLODipine (NORVASC) 5 MG tablet Take 1 tablet (5 mg total) by mouth daily.  Marland Kitchen aspirin 81 MG tablet Take 81 mg by mouth daily.  . colesevelam (WELCHOL) 625 MG tablet Take 3 tablets (1,875 mg total) by mouth 2 (two) times daily with a meal. Takes 3 tables  twice daily  . Continuous Blood Gluc Receiver (FREESTYLE LIBRE 14 DAY READER) DEVI USE AS DIRECTED  . Continuous Blood Gluc Sensor (FREESTYLE LIBRE 14 DAY SENSOR) MISC INJECT 1 INTO THE SKIN **SEE ADMIN INSTRUCTIONS**  . cyclobenzaprine (FLEXERIL) 10 MG tablet Take 1 tablet (10 mg total) by mouth at bedtime.  Marland Kitchen ezetimibe (ZETIA) 10 MG tablet Take 1 tablet (10 mg total) by mouth daily.  Marland Kitchen FARXIGA 10 MG TABS tablet TAKE 1 TABLET BY MOUTH DAILY  . lovastatin (MEVACOR) 20 MG tablet Take 1 tablet (20 mg total) by mouth at bedtime.  . meclizine  (ANTIVERT) 12.5 MG tablet Take 1 tablet (12.5 mg total) by mouth 3 (three) times daily as needed for dizziness.  . meloxicam (MOBIC) 15 MG tablet Take 1 tablet (15 mg total) by mouth daily.  . metFORMIN (GLUCOPHAGE) 1000 MG tablet Take 1 tablet (1,000 mg total) by mouth 2 (two) times daily with a meal.  . methylphenidate (RITALIN) 5 MG tablet Take 2 tablets (10 mg total) by mouth daily as needed.  . niacin (NIASPAN) 1000 MG CR tablet Take 1 tablet (1,000 mg total) by mouth at bedtime.  Marland Kitchen olmesartan-hydrochlorothiazide (BENICAR HCT) 40-25 MG tablet TAKE 1 TABLET DAILY.  Marland Kitchen pregabalin (LYRICA) 50 MG capsule Take 1 capsule (50 mg total) by mouth 3 (three) times daily.  . sildenafil (REVATIO) 20 MG tablet Take 1 tablet (20 mg total) by mouth as needed. 1-5 prn  . TRULICITY 3 MG/0.5ML SOPN INJECT 3 MG INTO THE SKIN ONCE A WEEK.  . Vitamin D, Ergocalciferol, (DRISDOL) 1.25 MG (50000 UNIT) CAPS capsule Take 1 capsule (50,000 Units total) by mouth every 7 (seven) days.   Facility-Administered Encounter Medications as of 06/26/2020  Medication  . cyanocobalamin ((VITAMIN B-12)) injection 1,000 mcg     Review of Systems  Review of Systems  Constitutional: Positive for fatigue. Negative for chills and fever.  HENT: Negative.   Respiratory: Negative for cough and shortness of breath.   Cardiovascular: Negative.   Gastrointestinal: Negative.   Musculoskeletal: Positive for arthralgias.  Allergic/Immunologic: Negative.   Neurological: Positive for dizziness.  Psychiatric/Behavioral: Positive for decreased concentration.       Physical Exam  BP 138/74   Pulse 75   Temp (!) 97.3 F (36.3 C)   Wt 271 lb (122.9 kg)   SpO2 95%   BMI 34.79 kg/m   Wt Readings from Last 5 Encounters:  06/27/20 268 lb (121.6 kg)  06/26/20 271 lb (122.9 kg)  05/23/20 272 lb 3.2 oz (123.5 kg)  05/23/20 274 lb (124.3 kg)  05/02/20 273 lb (123.8 kg)     Physical Exam Vitals and nursing note reviewed.   Constitutional:      General: He is not in acute distress.    Appearance: He is well-developed.  Cardiovascular:     Rate and Rhythm: Normal rate and regular rhythm.  Pulmonary:     Effort: Pulmonary effort is normal.     Breath sounds: Normal breath sounds.  Skin:    General: Skin is warm and dry.  Neurological:     Mental Status: He is alert and oriented to person, place, and time.  Psychiatric:        Mood and Affect: Mood normal.        Behavior: Behavior normal.       Assessment & Plan:   History of COVID-19 Memory Loss Brain Fog Numbness to lower extremities Dizziness:  May need referral to neurology if symptoms persist  Hand out given on brain fog and memory loss   Multiple joint pain Fatigue:  Continue follow up with PMR   Vertigo:  Stay well hydrated  May continue meclizine as needed  Follow up:  As needed     [name], NP 06/30/2020

## 2020-06-26 NOTE — Patient Instructions (Signed)
History of Covid Memory Loss Brain Fog Numbness to lower extremities Dizziness:  May need referral to neurology if symptoms persist  Hand out given on brain fog and memory loss   Multiple joint pain Fatigue:  Continue follow up with PMR   Vertigo:  Stay well hydrated  May continue meclizine as needed  Follow up:  As needed

## 2020-06-27 ENCOUNTER — Encounter
Payer: BC Managed Care – PPO | Attending: Physical Medicine and Rehabilitation | Admitting: Physical Medicine and Rehabilitation

## 2020-06-27 ENCOUNTER — Encounter: Payer: Self-pay | Admitting: Physical Medicine and Rehabilitation

## 2020-06-27 ENCOUNTER — Other Ambulatory Visit: Payer: Self-pay

## 2020-06-27 VITALS — BP 100/66 | HR 95 | Temp 98.0°F | Ht 74.0 in | Wt 268.0 lb

## 2020-06-27 DIAGNOSIS — E559 Vitamin D deficiency, unspecified: Secondary | ICD-10-CM

## 2020-06-27 DIAGNOSIS — Z8616 Personal history of COVID-19: Secondary | ICD-10-CM | POA: Diagnosis not present

## 2020-06-27 DIAGNOSIS — R4584 Anhedonia: Secondary | ICD-10-CM | POA: Insufficient documentation

## 2020-06-27 DIAGNOSIS — R413 Other amnesia: Secondary | ICD-10-CM | POA: Insufficient documentation

## 2020-06-27 DIAGNOSIS — R200 Anesthesia of skin: Secondary | ICD-10-CM

## 2020-06-27 DIAGNOSIS — E538 Deficiency of other specified B group vitamins: Secondary | ICD-10-CM | POA: Diagnosis not present

## 2020-06-27 DIAGNOSIS — R5383 Other fatigue: Secondary | ICD-10-CM

## 2020-06-27 NOTE — Progress Notes (Signed)
Subjective:    Patient ID: Casey Hunter., male    DOB: Jul 07, 1956, 64 y.o.   MRN: [id number]  HPI  Mr. Garrette is a 64 year old man who presents for follow-up of left leg numbness, diffuse joint pains, and fatigue following COVID-19 infection.  He has also had vertigo episodes when he works outside. 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 The dizziness started six weeks prior to his first visit with me and worried him. He wants to be able to continue working- has worked at current job for 35 years- but is nervous about falling. He had falls while working and has to climb ladders for work. He does not believe there is any alternative work he can do for this company. He does not feel that another company will hire him given his age. Last visit I cut his amlodipine to 2.5mg  and he has had less dizziness. I advised him not to work in the summer months when it is hot given his dizziness and risk of injuring himself while on ladder and have completed disability paperwork for him since last visit.   His BP in office is 100/66 today, as compared to 132/86 last visit, despite the decreased dose of amlodipine. He has been making health lifestyle changes. He checks his BP at home and the highest it has gone to is 130s systolic over 85.  His sugars have been much better controlled recently: 80s-180s. He has a Advertising account executive.   He takes 1300mg  of Tylenol every morning and at night before bed. It bothers him to carry something heavy, fatigue bothers him. He was taking Meloxicam 15mg  daily until I advised him to stop it given risk of GI bleed with chronic use.   He has to wear flame protective clothes as part of his work which also contributed to potential overheating he experienced. I have completed disability paperwork for him but he states that he has not yet been paid by his company. Provided him my phone number to give to his insurance company should they contact him  with questions.   The only caffeine he drinks is unsweetened tea. He drinks no soft drinks. He drinks 6-10 glasses of water per day. When dizzy, he felt his head was spinning, not the world around him spinning. He never feels his vision blacking.   Current pain is 5/10- less than last visit despite being off of Meloxicam.  He has been referred to rheumatology but RA <10 and CRP is normal. He has psoriasis but this is well controlled and does not bother him. I advised that he does not need to follow with rheumatologist in this case as the number of providers he is seeing is expensive for him.  Since last visit he has seen neuropsych for cognitive testing and was displeased that this was not a neurology visit for workup of his sensory deficit.   He completed 7 days of 50,000 units of Vitamin D and asks when his Vitamin D level should be rechecked.   He has felt his energy improve and is not sure whether or not this is related to the higher dose of methylphenidate.   Pain Inventory Average Pain 6 Pain Right Now 5 My pain is dull, tingling and aching  In the last 24 hours, has pain interfered with the following? General activity 5 Relation with others 0 Enjoyment of life 5 What TIME of day is your pain at its worst? 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  no  Physicians involved in your care Any changes since last visit?  no   Family History  Problem Relation Age of Onset  . Hypertension Mother   . Hyperlipidemia Mother   . Hyperlipidemia Father    Social History   Socioeconomic History  . Marital status: Single    Spouse name: Not on file  . Number of children: 2  . Years of  education: 36  . Highest education level: High school graduate  Occupational History  . Not on file  Tobacco Use  . Smoking status: Never Smoker  . Smokeless tobacco: Never Used  Substance and Sexual Activity  . Alcohol use: No    Alcohol/week: 0.0 standard drinks  . Drug use: No  . Sexual activity: Not on file  Other Topics Concern  . Not on file  Social History Narrative   Lives alone   Social Determinants of Health   Financial Resource Strain:   . Difficulty of Paying Living Expenses:   Food Insecurity:   . Worried About Programme researcher, broadcasting/film/video in the Last Year:   . Barista in the Last Year:   Transportation Needs:   . Freight forwarder (Medical):   Marland Kitchen Lack of Transportation (Non-Medical):   Physical Activity:   . Days of Exercise per Week:   . Minutes of Exercise per Session:   Stress:   . Feeling of Stress :   Social Connections:   . Frequency of Communication with Friends and Family:   . Frequency of Social Gatherings with Friends and Family:   . Attends Religious Services:   . Active Member of Clubs or Organizations:   . Attends Banker Meetings:   Marland Kitchen Marital Status:    Past Surgical History:  Procedure Laterality Date  . HERNIA REPAIR     Past Medical History:  Diagnosis Date  . Diabetic peripheral neuropathy associated with type 2 diabetes mellitus 03/14/2017  . DOE (dyspnea on exertion) 11/2019   Onset jan 2021 with covid 19  - assoc with atypical cp developed during the infection present mostly sitting/ absent supine/ better with deep breathing/ no worse with ex - 01/30/2020   Walked RA x two laps =  approx 518ft @ brisk pace - stopped due to end of study, no sob/ no change cp  with sats of 93 % at the end of the study and no acute ekg changes  - Echo  02/13/2020  wnl with  no pericadial ef  . Fatigue 11/2019  . History of COVID-19 11/2019  . Hyperlipidemia associated with type 2 diabetes mellitus 09/16/2015  . Hypertension associated  with type 2 diabetes mellitus 09/16/2015  . Knee osteoarthritis 04/14/2016  . Mild neurocognitive disorder 06/12/2020  . Numbness of lower extremity 11/2019   Attributed to COVID-19  . Sleep apnea    Patient denied being diagnosed with OSA in the past   Ht 6\' 2"  (1.88 m)   Wt 268 lb (121.6 kg)   BMI 34.41 kg/m   Opioid Risk Score:   Fall Risk Score:  `1  Depression screen [facility] 2/9  Depression screen [facility] 2/9 05/02/2020 04/24/2020 12/06/2019 04/24/2018 01/03/2018 09/21/2017  06/01/2017  Decreased Interest 2 2 0 0 0 0 0  Down, Depressed, Hopeless 0 0 0 0 0 0 0  PHQ - 2 Score 2 2 0 0 0 0 0  Altered sleeping 0 - - - - - -  Tired, decreased energy 2 - - - - - -  Change in appetite 0 - - - - - -  Feeling bad or failure about yourself  0 - - - - - -  Trouble concentrating 0 - - - - - -  Moving slowly or fidgety/restless 1 - - - - - -  Suicidal thoughts 0 - - - - - -  PHQ-9 Score 5 - - - - - -  Difficult doing work/chores Somewhat difficult - - - - - -    Review of Systems  Constitutional: Positive for fatigue.  HENT: Negative.   Eyes: Negative.   Respiratory: Negative.   Cardiovascular: Negative.   Gastrointestinal: Negative.   Endocrine: Negative.   Genitourinary: Negative.   Musculoskeletal: Positive for arthralgias and gait problem.  Skin: Negative.   Allergic/Immunologic: Negative.   Neurological: Positive for dizziness, weakness and numbness.  Hematological: Negative.   Psychiatric/Behavioral: Positive for confusion.  All other systems reviewed and are negative.      Objective:   Physical Exam Gen: no distress, normal appearing HEENT: oral mucosa pink and moist, NCAT Cardio: Reg rate Chest: normal effort, normal rate of breathing Abd: soft, non-distended Ext: no edema Skin: intact Neuro: AOx3. Word forgetfullness improved  Musculoskeletal: 5/5 strength throughout. Decreased sensation in left medial calf. Normal spine flexion and extension. Negative Romberg's test.    Psych: pleasant, normal affect. Anhedonia improved, fatigue improved    Assessment & Plan:  [name] is a 64 year old man who presents with diffuse joint pain, fatigue, anhedonia, dizziness while working, and low vitamin D level with history of COVID-19.  1) Long COVID Syndrome: -Discussed and educated regarding pathophysiology of COVID-19 infection and how this is the likely cause of his fatigue, diffuse joint pain, and anhedonia. He denies depression. All of his symptoms have improved from last visit which could be due to lifestyle changes, medication changes, natural course of improvement from virus, or a combination of these.   -May continue methyphenidate 5mg  or 10mg  as needed after breakfast for low energy. Does not need to take every day. Does not currently require refill.   -Discussed the role of cardiopulmonary rehab. He currently does not lack in strength or endurance but is more suffering from fatigue, which is improving.  2) Vitamin D deficiency: Prescribed 50,000U ergocalciferol weekly for 7 weeks. Explained that this can also related to low energy, anhedonia, and diffuse joint pain. Vitamin D level checked this visit and has normalized- discussed with [name]. Also recommended that he resume eating 2 eggs daily with breakfast.   3) Hypotension:  Could be contributing to dizziness when he works in the heat. Check BP daily, log, and bring to next visit. Also check when feeling dizzy. He stays well hydrated. Stop taking amlodipine.   4) Diffuse joint pain: May restart Meloxicam. Vitamin D prescription strength supplementation may help. Steroids may be considered in future, but side effects discussed with patient. Not ideal given his diabetes and negative response to steroids in the past. Symptoms have improved. Can consider compounded cream in future as well.   5) Numbness in left medial calf may be related to viral infection of nerve. Unlikely secondary to spinal stenosis. May  self resolve  over time. Not greatly affecting function. Balance deficit during dizziness more likely secondary to low blood pressure. Sensory deficit has improved. Advised that neurology visit will likely not help in management. Since symptoms improved, more likely neuropraxic injury which will continue to improve over time. Advised that EMG/NCS can provide diagnosis but will not change the condition.  6) Cognitive deficits: Improved. Appreciate neuropsych eval- note reviewed.   7) Impaired mobility and ADLs: provided note recommending that he not climb ladders in work during hot summer days given hypotension and dizziness. I have completed disability paperwork. Provided with my cell phone number to give to disability company as he has not yet been paid.  8) Low B12- past level reviewed: was at low end of normal. After receiving B12 injections this has improved quite well.  9) HLD: Reviewed labs and medications. I will look into injectable options for him as per his request. Provided dietary counseling.   40 minutes of patient care was spent including review of neuropsych note; discussion of dizziness, fatigue, and pain; evaluation of psoriasis; dietary counseling; ordering, review, and discussion of Vitamin D and B12 labs; review of blood pressure, discussion of cessation of amlodipine; discussion of prn use of methylphenidate; discussion of lower extremity numbness; and discussion of disability paperwork.

## 2020-06-28 LAB — B12 AND FOLATE PANEL
Folate: 11.2 ng/mL (ref >3.0)
Vitamin B-12: 733 pg/mL (ref 232–1245)

## 2020-06-28 LAB — VITAMIN D 25 HYDROXY (VIT D DEFICIENCY, FRACTURES): Vit D, 25-Hydroxy: 37.3 ng/mL (ref 30.0–100.0)

## 2020-06-29 ENCOUNTER — Encounter: Payer: Self-pay | Admitting: Physical Medicine and Rehabilitation

## 2020-06-30 NOTE — Assessment & Plan Note (Signed)
Memory Loss Brain Fog Numbness to lower extremities Dizziness:  May need referral to neurology if symptoms persist  Hand out given on brain fog and memory loss   Multiple joint pain Fatigue:  Continue follow up with PMR   Vertigo:  Stay well hydrated  May continue meclizine as needed  Follow up:  As needed

## 2020-07-09 ENCOUNTER — Telehealth: Payer: Self-pay

## 2020-07-09 NOTE — Telephone Encounter (Signed)
Patient called wanting to know if he should consider getting the booster vaccine for Covid and should he get the same one he got before?

## 2020-07-09 NOTE — Telephone Encounter (Signed)
Called patient thank you~

## 2020-07-16 ENCOUNTER — Ambulatory Visit: Payer: BC Managed Care – PPO | Admitting: Family Medicine

## 2020-07-17 ENCOUNTER — Encounter: Payer: Self-pay | Admitting: Family Medicine

## 2020-07-17 ENCOUNTER — Other Ambulatory Visit: Payer: Self-pay

## 2020-07-17 ENCOUNTER — Ambulatory Visit (INDEPENDENT_AMBULATORY_CARE_PROVIDER_SITE_OTHER): Payer: BC Managed Care – PPO | Admitting: Family Medicine

## 2020-07-17 VITALS — BP 120/80 | HR 78 | Temp 97.9°F | Wt 264.8 lb

## 2020-07-17 DIAGNOSIS — E785 Hyperlipidemia, unspecified: Secondary | ICD-10-CM | POA: Diagnosis not present

## 2020-07-17 DIAGNOSIS — E1142 Type 2 diabetes mellitus with diabetic polyneuropathy: Secondary | ICD-10-CM

## 2020-07-17 DIAGNOSIS — I152 Hypertension secondary to endocrine disorders: Secondary | ICD-10-CM

## 2020-07-17 DIAGNOSIS — E1159 Type 2 diabetes mellitus with other circulatory complications: Secondary | ICD-10-CM

## 2020-07-17 DIAGNOSIS — I10 Essential (primary) hypertension: Secondary | ICD-10-CM

## 2020-07-17 DIAGNOSIS — M170 Bilateral primary osteoarthritis of knee: Secondary | ICD-10-CM

## 2020-07-17 DIAGNOSIS — E1169 Type 2 diabetes mellitus with other specified complication: Secondary | ICD-10-CM

## 2020-07-17 LAB — BAYER DCA HB A1C WAIVED: HB A1C (BAYER DCA - WAIVED): 7.7 % — ABNORMAL HIGH (ref <7.0)

## 2020-07-17 MED ORDER — LOVASTATIN 20 MG PO TABS
20.0000 mg | ORAL_TABLET | Freq: Every day | ORAL | 1 refills | Status: DC
Start: 2020-07-17 — End: 2020-11-17

## 2020-07-17 MED ORDER — OLMESARTAN MEDOXOMIL-HCTZ 40-25 MG PO TABS
1.0000 | ORAL_TABLET | Freq: Every day | ORAL | 1 refills | Status: DC
Start: 2020-07-17 — End: 2020-11-17

## 2020-07-17 MED ORDER — NIACIN ER (ANTIHYPERLIPIDEMIC) 1000 MG PO TBCR: 1000.0000 mg | EXTENDED_RELEASE_TABLET | Freq: Every day | ORAL | 1 refills | Status: DC

## 2020-07-17 MED ORDER — METFORMIN HCL 1000 MG PO TABS: 1000.0000 mg | ORAL_TABLET | Freq: Two times a day (BID) | ORAL | 1 refills | Status: DC

## 2020-07-17 MED ORDER — DAPAGLIFLOZIN PROPANEDIOL 10 MG PO TABS: 10.0000 mg | ORAL_TABLET | Freq: Every day | ORAL | 1 refills | Status: DC

## 2020-07-17 MED ORDER — EZETIMIBE 10 MG PO TABS: 10.0000 mg | ORAL_TABLET | Freq: Every day | ORAL | 1 refills | Status: DC

## 2020-07-17 MED ORDER — PREGABALIN 50 MG PO CAPS: 50.0000 mg | ORAL_CAPSULE | Freq: Three times a day (TID) | ORAL | 1 refills | Status: DC

## 2020-07-17 MED ORDER — MELOXICAM 15 MG PO TABS: 15.0000 mg | ORAL_TABLET | Freq: Every day | ORAL | 1 refills | Status: DC

## 2020-07-17 MED ORDER — COLESEVELAM HCL 625 MG PO TABS: 1875.0000 mg | ORAL_TABLET | Freq: Two times a day (BID) | ORAL | 1 refills | Status: DC

## 2020-07-17 NOTE — Assessment & Plan Note (Signed)
Under good control on current regimen. Continue current regimen. Continue to monitor. Call with any concerns. Refills given.   

## 2020-07-17 NOTE — Progress Notes (Signed)
BP 120/80 (BP Location: Left Arm, Patient Position: Sitting, Cuff Size: Normal)    Pulse 78    Temp 97.9 F (36.6 C) (Oral)    Wt 264 lb 12.8 oz (120.1 kg)    SpO2 95%    BMI 34.00 kg/m    Subjective:    Patient ID: Casey Greenspan., male    DOB: November 17, 1956, 64 y.o.   MRN: [id number]  HPI: Casey Dibello. is a 64 y.o. male  Chief Complaint  Patient presents with   Diabetes   DIABETES Hypoglycemic episodes:no Polydipsia/polyuria: no Visual disturbance: no Chest pain: no Paresthesias: no Glucose Monitoring: yes  Accucheck frequency: Daily  Fasting glucose: 80-100 Taking Insulin?: no Blood Pressure Monitoring: a few times a week Retinal Examination: Not up to Date Foot Exam: Not up to Date Diabetic Education: Completed Pneumovax: Up to Date Influenza: Not up to Date Aspirin: no   HYPERTENSION / HYPERLIPIDEMIA Satisfied with current treatment? yes Duration of hypertension: chronic BP monitoring frequency: not checking BP medication side effects: no Past BP meds: benazepril, HCTZ, amlodipine Duration of hyperlipidemia: chronic Cholesterol medication side effects: no Cholesterol supplements: fish oil and niacin Past cholesterol medications: lovastatin, zetia, welchol Medication compliance: excellent compliance Aspirin: no Recent stressors: no Recurrent headaches: no Visual changes: no Palpitations: no Dyspnea: no Chest pain: no Lower extremity edema: no Dizzy/lightheaded: yes   Relevant past medical, surgical, family and social history reviewed and updated as indicated. Interim medical history since our last visit reviewed. Allergies and medications reviewed and updated.  Review of Systems  Constitutional: Negative.   Respiratory: Negative.   Cardiovascular: Negative.   Gastrointestinal: Negative.   Musculoskeletal: Negative.   Neurological: Negative.   Psychiatric/Behavioral: Negative.     Per HPI unless specifically indicated above       Objective:    BP 120/80 (BP Location: Left Arm, Patient Position: Sitting, Cuff Size: Normal)    Pulse 78    Temp 97.9 F (36.6 C) (Oral)    Wt 264 lb 12.8 oz (120.1 kg)    SpO2 95%    BMI 34.00 kg/m   Wt Readings from Last 3 Encounters:  07/17/20 264 lb 12.8 oz (120.1 kg)  06/27/20 268 lb (121.6 kg)  06/26/20 271 lb (122.9 kg)    Physical Exam Vitals and nursing note reviewed.  Constitutional:      General: He is not in acute distress.    Appearance: Normal appearance. He is not ill-appearing, toxic-appearing or diaphoretic.  HENT:     Head: Normocephalic and atraumatic.     Right Ear: External ear normal.     Left Ear: External ear normal.     Nose: Nose normal.     Mouth/Throat:     Mouth: Mucous membranes are moist.     Pharynx: Oropharynx is clear.  Eyes:     General: No scleral icterus.       Right eye: No discharge.        Left eye: No discharge.     Extraocular Movements: Extraocular movements intact.     Conjunctiva/sclera: Conjunctivae normal.     Pupils: Pupils are equal, round, and reactive to light.  Cardiovascular:     Rate and Rhythm: Normal rate and regular rhythm.     Pulses: Normal pulses.     Heart sounds: Normal heart sounds. No murmur heard.  No friction rub. No gallop.   Pulmonary:     Effort: Pulmonary effort is normal. No respiratory distress.  Breath sounds: Normal breath sounds. No stridor. No wheezing, rhonchi or rales.  Chest:     Chest wall: No tenderness.  Musculoskeletal:        General: Normal range of motion.     Cervical back: Normal range of motion and neck supple.  Skin:    General: Skin is warm and dry.     Capillary Refill: Capillary refill takes less than 2 seconds.     Coloration: Skin is not jaundiced or pale.     Findings: No bruising, erythema, lesion or rash.  Neurological:     General: No focal deficit present.     Mental Status: He is alert and oriented to person, place, and time. Mental status is at baseline.   Psychiatric:        Mood and Affect: Mood normal.        Behavior: Behavior normal.        Thought Content: Thought content normal.        Judgment: Judgment normal.     Results for orders placed or performed in visit on 06/27/20  Vitamin D (25 hydroxy)  Result Value Ref Range   Vit D, 25-Hydroxy 37.3 30.0 - 100.0 ng/mL  B12 and Folate Panel  Result Value Ref Range   Vitamin B-12 733 232 - 1,245 pg/mL   Folate 11.2 >3.0 ng/mL      Assessment & Plan:   Problem List Items Addressed This Visit      Cardiovascular and Mediastinum   Hypertension associated with type 2 diabetes mellitus    Under good control on current regimen. Continue current regimen. Continue to monitor. Call with any concerns. Refills given.        Relevant Medications   olmesartan-hydrochlorothiazide (BENICAR HCT) 40-25 MG tablet   niacin (NIASPAN) 1000 MG CR tablet   metFORMIN (GLUCOPHAGE) 1000 MG tablet   lovastatin (MEVACOR) 20 MG tablet   dapagliflozin propanediol (FARXIGA) 10 MG TABS tablet   ezetimibe (ZETIA) 10 MG tablet   colesevelam (WELCHOL) 625 MG tablet     Endocrine   Hyperlipidemia associated with type 2 diabetes mellitus    Under good control on current regimen. Continue current regimen. Continue to monitor. Call with any concerns. Refills given.       Relevant Medications   olmesartan-hydrochlorothiazide (BENICAR HCT) 40-25 MG tablet   niacin (NIASPAN) 1000 MG CR tablet   metFORMIN (GLUCOPHAGE) 1000 MG tablet   lovastatin (MEVACOR) 20 MG tablet   dapagliflozin propanediol (FARXIGA) 10 MG TABS tablet   ezetimibe (ZETIA) 10 MG tablet   colesevelam (WELCHOL) 625 MG tablet   Diabetic peripheral neuropathy associated with type 2 diabetes mellitus - Primary    Doing slightly better with A1c of 7.7. Will work on diet and exercise. Call with any concerns. Continue to monitor. Recheck 3 months.       Relevant Medications   olmesartan-hydrochlorothiazide (BENICAR HCT) 40-25 MG tablet    pregabalin (LYRICA) 50 MG capsule   metFORMIN (GLUCOPHAGE) 1000 MG tablet   lovastatin (MEVACOR) 20 MG tablet   dapagliflozin propanediol (FARXIGA) 10 MG TABS tablet   Other Relevant Orders   Bayer DCA Hb A1c Waived     Musculoskeletal and Integument   Knee osteoarthritis   Relevant Medications   meloxicam (MOBIC) 15 MG tablet    Other Visit Diagnoses    Hyperlipidemia, unspecified hyperlipidemia type       Relevant Medications   olmesartan-hydrochlorothiazide (BENICAR HCT) 40-25 MG tablet   niacin (NIASPAN) 1000 MG  CR tablet   lovastatin (MEVACOR) 20 MG tablet   ezetimibe (ZETIA) 10 MG tablet   colesevelam (WELCHOL) 625 MG tablet       Follow up plan: Return in about 3 months (around 10/17/2020) for physical.

## 2020-07-17 NOTE — Assessment & Plan Note (Signed)
Doing slightly better with A1c of 7.7. Will work on diet and exercise. Call with any concerns. Continue to monitor. Recheck 3 months.

## 2020-08-06 ENCOUNTER — Telehealth: Payer: Self-pay

## 2020-08-06 NOTE — Telephone Encounter (Signed)
Called.

## 2020-08-06 NOTE — Telephone Encounter (Signed)
Received call from dr Roxanna Mew disability  [phone] wants to inquire about comments on a disability form - please contact him

## 2020-08-08 ENCOUNTER — Encounter: Payer: Self-pay | Admitting: Physical Medicine and Rehabilitation

## 2020-08-08 ENCOUNTER — Telehealth: Payer: Self-pay | Admitting: Family Medicine

## 2020-08-08 ENCOUNTER — Encounter
Payer: BC Managed Care – PPO | Attending: Physical Medicine and Rehabilitation | Admitting: Physical Medicine and Rehabilitation

## 2020-08-08 ENCOUNTER — Other Ambulatory Visit: Payer: Self-pay

## 2020-08-08 VITALS — BP 124/81 | HR 73 | Temp 97.9°F | Ht 74.0 in | Wt 266.8 lb

## 2020-08-08 DIAGNOSIS — E538 Deficiency of other specified B group vitamins: Secondary | ICD-10-CM | POA: Diagnosis not present

## 2020-08-08 DIAGNOSIS — R200 Anesthesia of skin: Secondary | ICD-10-CM

## 2020-08-08 DIAGNOSIS — E559 Vitamin D deficiency, unspecified: Secondary | ICD-10-CM | POA: Diagnosis not present

## 2020-08-08 DIAGNOSIS — R4584 Anhedonia: Secondary | ICD-10-CM | POA: Diagnosis not present

## 2020-08-08 DIAGNOSIS — R413 Other amnesia: Secondary | ICD-10-CM | POA: Insufficient documentation

## 2020-08-08 DIAGNOSIS — R5383 Other fatigue: Secondary | ICD-10-CM | POA: Insufficient documentation

## 2020-08-08 DIAGNOSIS — R42 Dizziness and giddiness: Secondary | ICD-10-CM

## 2020-08-08 DIAGNOSIS — Z8616 Personal history of COVID-19: Secondary | ICD-10-CM

## 2020-08-08 MED ORDER — VITAMIN D (ERGOCALCIFEROL) 1.25 MG (50000 UNIT) PO CAPS
50000.0000 [IU] | ORAL_CAPSULE | ORAL | 0 refills | Status: DC
Start: 2020-08-08 — End: 2020-11-17

## 2020-08-08 NOTE — Telephone Encounter (Signed)
Called and spoke to patient. I left him know that we did receive a fax from his insurance company that Dr. Laural Benes has completed and faxed back since they could not get in touch with her by phone.

## 2020-08-08 NOTE — Telephone Encounter (Signed)
Copied from CRM [id number]. Topic: General - Inquiry >> Aug 08, 2020 11:24 AM Leary Roca wrote: Reason for CRM: Pt states insurance company is trying to reach [employer] nurse regarding disability. Pt would like a call back

## 2020-08-08 NOTE — Progress Notes (Signed)
Subjective:    Patient ID: Casey Greenspan., male    DOB: 1956-09-09, 64 y.o.   MRN: [id number]  HPI  Casey Hunter returns for follow-up of LONG COVID symptoms.   Dizziness is better, but still present at times. He has brought with him an excellent log of his AM and PM blood pressures, which are excellent off any blood pressure medications.   Obesity: He has made excellent changes to his diet. He has lost 2 lbs since last visit (currently 266 lbs). He has been eating one banana per day, eggs daily, salmon once per week, meat, and a lot of nuts. He feels he still needs to minimize sweets.   Energy: Has greatly improved but is still not at the level required for his work. He has weaned off the Ritalin. Discussed goal of restarting work on October 11th for three days per week and he is agreeable.   Reviewed PCP note and he has been doing much better. HbA1c has improved to 7.7  Left leg numbness continues but is slightly improved.     Pain Inventory Average Pain 4 Pain Right Now 4 My pain is intermittent and aching  In the last 24 hours, has pain interfered with the following? General activity 4 Relation with others 4 Enjoyment of life 4 What TIME of day is your pain at its worst? daytime Sleep (in general) Good  Pain is worse with: walking, bending and some activites Pain improves with: medication Relief from Meds: 5  Family History  Problem Relation Age of Onset  . Hypertension Mother   . Hyperlipidemia Mother   . Hyperlipidemia Father    Social History   Socioeconomic History  . Marital status: Single    Spouse name: Not on file  . Number of children: 2  . Years of education: 22  . Highest education level: High school graduate  Occupational History  . Not on file  Tobacco Use  . Smoking status: Never Smoker  . Smokeless tobacco: Never Used  Vaping Use  . Vaping Use: Never used  Substance and Sexual Activity  . Alcohol use: No    Alcohol/week: 0.0 standard  drinks  . Drug use: No  . Sexual activity: Not on file  Other Topics Concern  . Not on file  Social History Narrative   Lives alone   Social Determinants of Health   Financial Resource Strain:   . Difficulty of Paying Living Expenses: Not on file  Food Insecurity:   . Worried About Programme researcher, broadcasting/film/video in the Last Year: Not on file  . Ran Out of Food in the Last Year: Not on file  Transportation Needs:   . Lack of Transportation (Medical): Not on file  . Lack of Transportation (Non-Medical): Not on file  Physical Activity:   . Days of Exercise per Week: Not on file  . Minutes of Exercise per Session: Not on file  Stress:   . Feeling of Stress : Not on file  Social Connections:   . Frequency of Communication with Friends and Family: Not on file  . Frequency of Social Gatherings with Friends and Family: Not on file  . Attends Religious Services: Not on file  . Active Member of Clubs or Organizations: Not on file  . Attends Banker Meetings: Not on file  . Marital Status: Not on file   Past Surgical History:  Procedure Laterality Date  . HERNIA REPAIR     Past Surgical History:  Procedure Laterality Date  . HERNIA REPAIR     Past Medical History:  Diagnosis Date  . Diabetic peripheral neuropathy associated with type 2 diabetes mellitus 03/14/2017  . DOE (dyspnea on exertion) 11/2019   Onset jan 2021 with covid 19  - assoc with atypical cp developed during the infection present mostly sitting/ absent supine/ better with deep breathing/ no worse with ex - 01/30/2020   Walked RA x two laps =  approx 565ft @ brisk pace - stopped due to end of study, no sob/ no change cp  with sats of 93 % at the end of the study and no acute ekg changes  - Echo  02/13/2020  wnl with  no pericadial ef  . Fatigue 11/2019  . History of COVID-19 11/2019  . Hyperlipidemia associated with type 2 diabetes mellitus 09/16/2015  . Hypertension associated with type 2 diabetes mellitus 09/16/2015   . Knee osteoarthritis 04/14/2016  . Mild neurocognitive disorder 06/12/2020  . Numbness of lower extremity 11/2019   Attributed to COVID-19  . Sleep apnea    Patient denied being diagnosed with OSA in the past   BP 124/81   Pulse 73   Temp 97.9 F (36.6 C)   Ht 6\' 2"  (1.88 m)   Wt 266 lb 12.8 oz (121 kg)   SpO2 96%   BMI 34.26 kg/m   Opioid Risk Score:   Fall Risk Score:  `1  Depression screen PHQ 2/9  Depression screen [facility] 2/9 05/02/2020 04/24/2020 12/06/2019 04/24/2018 01/03/2018 09/21/2017 06/01/2017  Decreased Interest 2 2 0 0 0 0 0  Down, Depressed, Hopeless 0 0 0 0 0 0 0  PHQ - 2 Score 2 2 0 0 0 0 0  Altered sleeping 0 - - - - - -  Tired, decreased energy 2 - - - - - -  Change in appetite 0 - - - - - -  Feeling bad or failure about yourself  0 - - - - - -  Trouble concentrating 0 - - - - - -  Moving slowly or fidgety/restless 1 - - - - - -  Suicidal thoughts 0 - - - - - -  PHQ-9 Score 5 - - - - - -  Difficult doing work/chores Somewhat difficult - - - - - -   Review of Systems  Constitutional: Negative.   HENT: Negative.   Eyes: Negative.   Respiratory: Negative.   Cardiovascular: Negative.   Gastrointestinal: Negative.   Endocrine: Negative.   Genitourinary: Negative.   Musculoskeletal: Positive for arthralgias and gait problem.  Skin: Negative.   Allergic/Immunologic: Negative.   Hematological: Negative.   Psychiatric/Behavioral: Negative.   All other systems reviewed and are negative.      Objective:   Physical Exam Gen: no distress, normal appearing HEENT: oral mucosa pink and moist, NCAT Cardio: Reg rate Chest: normal effort, normal rate of breathing Abd: soft, non-distended Ext: no edema Skin: intact Neuro: Alert and oriented x3.  Psych: pleasant, normal affect, improved energy.     Assessment & Plan:  Casey Hunter is a 64 year old man who presents for follow-up of Long COVID symptoms.  Dizziness: Improved but still present. He has brought BP log  and Bps have been under excellent control off any medication.  Fatigue: Improved but still present. Off Ritalin. Made goal to return to work on October 11th three days per week.   Low vitamin D: 8/6 vitamin D level reviewed and is 64 which is  at the low end of a normal range. Will supplement again with 50,000 U weekly for 7 weeks.   Leg numbness: Persists, but improved.   Obesity: BMI is 34.26 and weight is 266 lbs. He has lost 2 lbs since last visit and has been following a very healthy diet! He eats salmon once per week, eggs daily, nuts, meat. Made goals to decrease sweets and to add 2 spoons of olive oil daily.   DM 2: HgbA1c reviewed. 7.7- improved from before. PCP note reviewed- overall improved.   General health: Provided script for Shingles and pneumococcal vaccines. Discussed reasons. Advised early flu shot.   All questions answered. RTC in 3 months.

## 2020-09-01 ENCOUNTER — Other Ambulatory Visit: Payer: Self-pay | Admitting: Physical Medicine and Rehabilitation

## 2020-09-04 ENCOUNTER — Ambulatory Visit: Payer: BC Managed Care – PPO | Admitting: Rheumatology

## 2020-09-23 ENCOUNTER — Ambulatory Visit: Payer: BC Managed Care – PPO | Admitting: Rheumatology

## 2020-09-23 ENCOUNTER — Other Ambulatory Visit: Payer: Self-pay | Admitting: Physical Medicine and Rehabilitation

## 2020-09-23 MED ORDER — PREGABALIN 75 MG PO CAPS: 75.0000 mg | ORAL_CAPSULE | Freq: Three times a day (TID) | ORAL | 2 refills | Status: DC

## 2020-10-28 ENCOUNTER — Telehealth: Payer: Self-pay

## 2020-10-28 NOTE — Telephone Encounter (Signed)
Called pt scheduled cpe 12/27

## 2020-10-28 NOTE — Telephone Encounter (Signed)
Called pt to r/s cpe on 12/20 per Dr Laural Benes being unavailable. Pt is wanting to be accommodated since he had already planned this appt long ago and he's off work or he will have to find a new provider. Offered to get him scheduled with another provider for cpe, pt refuses saying that he does not want to jump from provider to provider. Can I schedule him for his cpe on 12/27 at 10:40?

## 2020-10-28 NOTE — Telephone Encounter (Signed)
That is fine 

## 2020-11-07 ENCOUNTER — Other Ambulatory Visit: Payer: Self-pay

## 2020-11-07 ENCOUNTER — Encounter: Payer: Self-pay | Admitting: Physical Medicine and Rehabilitation

## 2020-11-07 ENCOUNTER — Encounter
Payer: BC Managed Care – PPO | Attending: Physical Medicine and Rehabilitation | Admitting: Physical Medicine and Rehabilitation

## 2020-11-07 VITALS — BP 125/86 | HR 94 | Temp 98.2°F | Ht 73.0 in | Wt 271.0 lb

## 2020-11-07 DIAGNOSIS — R42 Dizziness and giddiness: Secondary | ICD-10-CM | POA: Insufficient documentation

## 2020-11-07 DIAGNOSIS — E1142 Type 2 diabetes mellitus with diabetic polyneuropathy: Secondary | ICD-10-CM | POA: Diagnosis not present

## 2020-11-07 DIAGNOSIS — Z8616 Personal history of COVID-19: Secondary | ICD-10-CM | POA: Diagnosis not present

## 2020-11-07 DIAGNOSIS — R5383 Other fatigue: Secondary | ICD-10-CM

## 2020-11-07 DIAGNOSIS — R200 Anesthesia of skin: Secondary | ICD-10-CM | POA: Diagnosis not present

## 2020-11-07 NOTE — Progress Notes (Signed)
Subjective:    Patient ID: Casey Greenspan., male    DOB: 10-21-1956, 64 y.o.   MRN: [id number]  HPI  Casey Hunter returns for follow-up of LONG COVID symptoms.   He feels overall better energy. He wants to increase his work to 4 days per week.   He continues to have numbness in his left calf. It is better than initially. His left hand will also sleep. It hurts but he is not sure if this is pain.  He has been having increase readings on his Freestyle Libre device- around 200s. For breakfast he eats eggs and sausage. For lunch he eats barebcue and sometimes french fries. Once in a while he eats sugar. He drinks water and unsweet tea and a fruit drink. He eats two bananas every day.   He has been checking pressures every day and they have been well controlled. He takes his BP medication 5 days per week.  Prior history: Dizziness is better, but still present at times. He has brought with him an excellent log of his AM and PM blood pressures, which are excellent off any blood pressure medications.   Obesity: He has made excellent changes to his diet. He has lost 2 lbs since last visit (currently 266 lbs). He has been eating one banana per day, eggs daily, salmon once per week, meat, and a lot of nuts. He feels he still needs to minimize sweets.   Energy: Has greatly improved but is still not at the level required for his work. He has weaned off the Ritalin. Discussed goal of restarting work on October 11th for three days per week and he is agreeable.   Reviewed PCP note and he has been doing much better. HbA1c has improved to 7.7  Left leg numbness continues but is slightly improved.     Pain Inventory Average Pain 5 Pain Right Now 5 My pain is intermittent, burning, tingling and aching  In the last 24 hours, has pain interfered with the following? General activity 5 Relation with others 5 Enjoyment of life 5 What TIME of day is your pain at its worst? daytime Sleep (in general)  Good  Pain is worse with: standing and some activites Pain improves with: medication Relief from Meds: 6  Family History  Problem Relation Age of Onset  . Hypertension Mother   . Hyperlipidemia Mother   . Hyperlipidemia Father    Social History   Socioeconomic History  . Marital status: Single    Spouse name: Not on file  . Number of children: 2  . Years of education: 39  . Highest education level: High school graduate  Occupational History  . Not on file  Tobacco Use  . Smoking status: Never Smoker  . Smokeless tobacco: Never Used  Vaping Use  . Vaping Use: Never used  Substance and Sexual Activity  . Alcohol use: No    Alcohol/week: 0.0 standard drinks  . Drug use: No  . Sexual activity: Not on file  Other Topics Concern  . Not on file  Social History Narrative   Lives alone   Social Determinants of Health   Financial Resource Strain: Not on file  Food Insecurity: Not on file  Transportation Needs: Not on file  Physical Activity: Not on file  Stress: Not on file  Social Connections: Not on file   Past Surgical History:  Procedure Laterality Date  . HERNIA REPAIR     Past Surgical History:  Procedure Laterality Date  .  HERNIA REPAIR     Past Medical History:  Diagnosis Date  . Diabetic peripheral neuropathy associated with type 2 diabetes mellitus 03/14/2017  . DOE (dyspnea on exertion) 11/2019   Onset jan 2021 with covid 19  - assoc with atypical cp developed during the infection present mostly sitting/ absent supine/ better with deep breathing/ no worse with ex - 01/30/2020   Walked RA x two laps =  approx 584ft @ brisk pace - stopped due to end of study, no sob/ no change cp  with sats of 93 % at the end of the study and no acute ekg changes  - Echo  02/13/2020  wnl with  no pericadial ef  . Fatigue 11/2019  . History of COVID-19 11/2019  . Hyperlipidemia associated with type 2 diabetes mellitus 09/16/2015  . Hypertension associated with type 2 diabetes  mellitus 09/16/2015  . Knee osteoarthritis 04/14/2016  . Mild neurocognitive disorder 06/12/2020  . Numbness of lower extremity 11/2019   Attributed to COVID-19  . Sleep apnea    Patient denied being diagnosed with OSA in the past   BP 125/86   Pulse 94   Temp 98.2 F (36.8 C)   Ht 6\' 1"  (1.854 m)   Wt 271 lb (122.9 kg)   SpO2 95%   BMI 35.75 kg/m   Opioid Risk Score:   Fall Risk Score:  `1  Depression screen PHQ 2/9  Depression screen [facility] 2/9 08/08/2020 05/02/2020 04/24/2020 12/06/2019 04/24/2018 01/03/2018 09/21/2017  Decreased Interest 0 2 2 0 0 0 0  Down, Depressed, Hopeless 0 0 0 0 0 0 0  PHQ - 2 Score 0 2 2 0 0 0 0  Altered sleeping - 0 - - - - -  Tired, decreased energy - 2 - - - - -  Change in appetite - 0 - - - - -  Feeling bad or failure about yourself  - 0 - - - - -  Trouble concentrating - 0 - - - - -  Moving slowly or fidgety/restless - 1 - - - - -  Suicidal thoughts - 0 - - - - -  PHQ-9 Score - 5 - - - - -  Difficult doing work/chores - Somewhat difficult - - - - -   Review of Systems  Constitutional: Negative.   HENT: Negative.   Eyes: Negative.   Respiratory: Negative.   Cardiovascular: Negative.   Gastrointestinal: Negative.   Endocrine: Negative.   Genitourinary: Negative.   Musculoskeletal: Positive for arthralgias and gait problem.  Skin: Negative.   Allergic/Immunologic: Negative.   Hematological: Negative.   Psychiatric/Behavioral: Negative.   All other systems reviewed and are negative.      Objective:   Physical Exam Gen: no distress, normal appearing HEENT: oral mucosa pink and moist, NCAT Cardio: Reg rate Chest: normal effort, normal rate of breathing Abd: soft, non-distended Ext: no edema Skin: intact Neuro: Musculoskeletal: Psych: pleasant, normal affect Neuro: Alert and oriented x3. Normal gait. No decreased sensation.  Psych: pleasant, normal affect, improved energy.     Assessment & Plan:  Casey Hunter is a 64 year old man who  presents for follow-up on LONG COVID symptoms.   HTN: BP has been well controlled at home. Discussed decreasing BP medication to every other day.   Fatigue: Improved but still present. Off Ritalin. Increase work to 4 days per week until re-eval on April 1st. Provided with note.   Low vitamin D: 8/6 vitamin D level reviewed and  is 5 which is at the low end of a normal range. Will supplement again with 50,000 U weekly for 7 weeks.   Leg numbness: Persists, but improved. Explained etiologies of nerve injury 2/2 COVID-19 vs. Diabetic neuropathy.   Obesity: BMI is 335.75 and weight is 271 lbs. He has gained 5 lbs since last visit. He eats salmon once per week, eggs daily, nuts, meat, olive oil. Made goal to avoid sugar.   DM 2: HgbA1c reviewed. 7.7- improved from before. PCP note reviewed- overall improved.   General health: He has received Shingles and pneumococcal vaccines, early flu shot, and 2nd COVID booster.   All questions answered. RTC in 3 months.   40 minutes spent in discussion of CBGs, diet, blood pressure, adjusting blood pressure medication, weight, fatigue, leg numbness, response to COVID booster, and in answering all questions

## 2020-11-07 NOTE — Addendum Note (Signed)
Addended by: Horton Chin on: 11/07/2020 07:24 PM   Modules accepted: Orders

## 2020-11-10 ENCOUNTER — Encounter: Payer: BC Managed Care – PPO | Admitting: Family Medicine

## 2020-11-17 ENCOUNTER — Other Ambulatory Visit: Payer: Self-pay

## 2020-11-17 ENCOUNTER — Ambulatory Visit (INDEPENDENT_AMBULATORY_CARE_PROVIDER_SITE_OTHER): Payer: BC Managed Care – PPO | Admitting: Family Medicine

## 2020-11-17 ENCOUNTER — Encounter: Payer: Self-pay | Admitting: Family Medicine

## 2020-11-17 ENCOUNTER — Telehealth: Payer: Self-pay

## 2020-11-17 VITALS — BP 104/68 | HR 74 | Temp 97.9°F | Ht 72.0 in | Wt 269.4 lb

## 2020-11-17 DIAGNOSIS — Z0000 Encounter for general adult medical examination without abnormal findings: Secondary | ICD-10-CM | POA: Diagnosis not present

## 2020-11-17 DIAGNOSIS — D692 Other nonthrombocytopenic purpura: Secondary | ICD-10-CM

## 2020-11-17 DIAGNOSIS — I152 Hypertension secondary to endocrine disorders: Secondary | ICD-10-CM | POA: Diagnosis not present

## 2020-11-17 DIAGNOSIS — E785 Hyperlipidemia, unspecified: Secondary | ICD-10-CM

## 2020-11-17 DIAGNOSIS — E1142 Type 2 diabetes mellitus with diabetic polyneuropathy: Secondary | ICD-10-CM | POA: Diagnosis not present

## 2020-11-17 DIAGNOSIS — E1159 Type 2 diabetes mellitus with other circulatory complications: Secondary | ICD-10-CM

## 2020-11-17 DIAGNOSIS — M170 Bilateral primary osteoarthritis of knee: Secondary | ICD-10-CM

## 2020-11-17 DIAGNOSIS — Z23 Encounter for immunization: Secondary | ICD-10-CM | POA: Diagnosis not present

## 2020-11-17 DIAGNOSIS — E1169 Type 2 diabetes mellitus with other specified complication: Secondary | ICD-10-CM | POA: Diagnosis not present

## 2020-11-17 LAB — URINALYSIS, ROUTINE W REFLEX MICROSCOPIC
Bilirubin, UA: NEGATIVE
Ketones, UA: NEGATIVE
Leukocytes,UA: NEGATIVE
Nitrite, UA: NEGATIVE
Protein,UA: NEGATIVE
RBC, UA: NEGATIVE
Specific Gravity, UA: 1.02 (ref 1.005–1.030)
Urobilinogen, Ur: 0.2 mg/dL (ref 0.2–1.0)
pH, UA: 5 (ref 5.0–7.5)

## 2020-11-17 LAB — BAYER DCA HB A1C WAIVED: HB A1C (BAYER DCA - WAIVED): 9.2 % — ABNORMAL HIGH (ref <7.0)

## 2020-11-17 MED ORDER — CYCLOBENZAPRINE HCL 10 MG PO TABS: 10.0000 mg | ORAL_TABLET | Freq: Every day | ORAL | 0 refills | Status: DC

## 2020-11-17 MED ORDER — LOVASTATIN 20 MG PO TABS: 20.0000 mg | ORAL_TABLET | Freq: Every day | ORAL | 1 refills | Status: DC

## 2020-11-17 MED ORDER — TRULICITY 4.5 MG/0.5ML ~~LOC~~ SOAJ: 4.5000 mg | SUBCUTANEOUS | 1 refills | Status: DC

## 2020-11-17 MED ORDER — EZETIMIBE 10 MG PO TABS: 10.0000 mg | ORAL_TABLET | Freq: Every day | ORAL | 1 refills | Status: DC

## 2020-11-17 MED ORDER — REPATHA SURECLICK 140 MG/ML ~~LOC~~ SOAJ: 140.0000 mg | SUBCUTANEOUS | 6 refills | Status: DC

## 2020-11-17 MED ORDER — DAPAGLIFLOZIN PROPANEDIOL 10 MG PO TABS: 10.0000 mg | ORAL_TABLET | Freq: Every day | ORAL | 1 refills | Status: DC

## 2020-11-17 MED ORDER — SILDENAFIL CITRATE 20 MG PO TABS: 20.0000 mg | ORAL_TABLET | ORAL | 12 refills | Status: DC | PRN

## 2020-11-17 MED ORDER — OLMESARTAN MEDOXOMIL-HCTZ 40-25 MG PO TABS
0.5000 | ORAL_TABLET | Freq: Every day | ORAL | 1 refills | Status: DC
Start: 2020-11-17 — End: 2021-02-05

## 2020-11-17 MED ORDER — COLESEVELAM HCL 625 MG PO TABS: 1875.0000 mg | ORAL_TABLET | Freq: Two times a day (BID) | ORAL | 1 refills | Status: DC

## 2020-11-17 MED ORDER — METFORMIN HCL 1000 MG PO TABS: 1000.0000 mg | ORAL_TABLET | Freq: Two times a day (BID) | ORAL | 1 refills | Status: DC

## 2020-11-17 MED ORDER — MELOXICAM 15 MG PO TABS
15.0000 mg | ORAL_TABLET | Freq: Every day | ORAL | 1 refills | Status: DC
Start: 2020-11-17 — End: 2021-08-25

## 2020-11-17 NOTE — Assessment & Plan Note (Signed)
Not under good control with A1c up to 9.2 up from 7.7. Will increase his trulicity to 4.5mg  and recheck 3 months. Continue current regimen. Recheck 3 months. Call with any concerns.

## 2020-11-17 NOTE — Telephone Encounter (Signed)
Pt stated no RX should be sent be sent  [place] medication should be sent to [facility] [place], [place] - [address] (Ph: [phone])   Trulicity  and new cholesterol should go to [id number], [place] - [address] (Ph: [phone])  Pleas send Cyclobenzaprine to [facility] he got a txt it was at [facility]

## 2020-11-17 NOTE — Progress Notes (Signed)
error 

## 2020-11-17 NOTE — Assessment & Plan Note (Signed)
Still not under great control on 4 agents. Would like to try repatha- rx sent today. If does well, will stop niacin, welchol and zetia and just continue lovastatin. Continue ot monitor. Labs drawn today.

## 2020-11-17 NOTE — Telephone Encounter (Signed)
All medications were sent to the correct pharmacy. Cyclobenazeprine was not refilled, can we send to [facility] please?

## 2020-11-17 NOTE — Addendum Note (Signed)
Addended by: Pablo Ledger on: 11/17/2020 12:00 PM   Modules accepted: Orders

## 2020-11-17 NOTE — Progress Notes (Signed)
BP 104/68   Pulse 74   Temp 97.9 F (36.6 C) (Oral)   Ht 6' (1.829 m)   Wt 269 lb 6.4 oz (122.2 kg)   SpO2 97%   BMI 36.54 kg/m    Subjective:    Patient ID: Casey Greenspan., male    DOB: [birth year], 64 y.o.   MRN: [id number]  HPI: Casey Paolini. is a 64 y.o. male presenting on 11/17/2020 for comprehensive medical examination. Current medical complaints include:  DIABETES Hypoglycemic episodes:no Polydipsia/polyuria: no Visual disturbance: no Chest pain: no Paresthesias: yes Glucose Monitoring: yes             Accucheck frequency: continuous             Fasting glucose: 140-250 Taking Insulin?: no Blood Pressure Monitoring: not checking Retinal Examination: Up to Date Foot Exam: Up to Date Diabetic Education: Completed Pneumovax: Up to Date Influenza: Up to Date Aspirin: yes  HYPERTENSION / HYPERLIPIDEMIA Satisfied with current treatment? yes Duration of hypertension: chronic BP monitoring frequency: not checking BP medication side effects: no Past BP meds: olmesartan, HCTZ Duration of hyperlipidemia: chronic Cholesterol medication side effects: no Cholesterol supplements: none Past cholesterol medications: lovastatin, zetia, welchol Medication compliance: excellent compliance Aspirin: yes Recent stressors: no Recurrent headaches: no Visual changes: no Palpitations: no Dyspnea: no Chest pain: no Lower extremity edema: no Dizzy/lightheaded: no  He currently lives with: alone Interim Problems from his last visit: no  Depression Screen done today and results listed below:  Depression screen [facility] 2/9 11/17/2020 08/08/2020 05/02/2020 04/24/2020 12/06/2019  Decreased Interest 0 0 2 2 0  Down, Depressed, Hopeless 0 0 0 0 0  PHQ - 2 Score 0 0 2 2 0  Altered sleeping - - 0 - -  Tired, decreased energy - - 2 - -  Change in appetite - - 0 - -  Feeling bad or failure about yourself  - - 0 - -  Trouble concentrating - - 0 - -  Moving slowly or  fidgety/restless - - 1 - -  Suicidal thoughts - - 0 - -  PHQ-9 Score - - 5 - -  Difficult doing work/chores - - Somewhat difficult - -    Past Medical History:  Past Medical History:  Diagnosis Date  . Diabetic peripheral neuropathy associated with type 2 diabetes mellitus 03/14/2017  . DOE (dyspnea on exertion) 11/2019   Onset jan 2021 with covid 19  - assoc with atypical cp developed during the infection present mostly sitting/ absent supine/ better with deep breathing/ no worse with ex - 01/30/2020   Walked RA x two laps =  approx 512ft @ brisk pace - stopped due to end of study, no sob/ no change cp  with sats of 93 % at the end of the study and no acute ekg changes  - Echo  02/13/2020  wnl with  no pericadial ef  . Fatigue 11/2019  . History of COVID-19 11/2019  . Hyperlipidemia associated with type 2 diabetes mellitus 09/16/2015  . Hypertension associated with type 2 diabetes mellitus 09/16/2015  . Knee osteoarthritis 04/14/2016  . Mild neurocognitive disorder 06/12/2020  . Numbness of lower extremity 11/2019   Attributed to COVID-19  . Sleep apnea    Patient denied being diagnosed with OSA in the past    Surgical History:  Past Surgical History:  Procedure Laterality Date  . HERNIA REPAIR      Medications:  Current Outpatient Medications on File Prior  to Visit  Medication Sig  . acetaminophen (TYLENOL) 650 MG CR tablet Take 1,300 mg by mouth in the morning and at bedtime.  Marland Kitchen. aspirin 81 MG tablet Take 81 mg by mouth daily.  . Continuous Blood Gluc Receiver (FREESTYLE LIBRE 14 DAY READER) DEVI USE AS DIRECTED  . Continuous Blood Gluc Sensor (FREESTYLE LIBRE 14 DAY SENSOR) MISC INJECT 1 INTO THE SKIN **SEE ADMIN INSTRUCTIONS**  . pregabalin (LYRICA) 75 MG capsule Take 1 capsule (75 mg total) by mouth 3 (three) times daily.  . meclizine (ANTIVERT) 12.5 MG tablet Take 1 tablet (12.5 mg total) by mouth 3 (three) times daily as needed for dizziness. (Patient not taking: Reported on  11/17/2020)   Current Facility-Administered Medications on File Prior to Visit  Medication  . cyanocobalamin ((VITAMIN B-12)) injection 1,000 mcg    Allergies:  Allergies  Allergen Reactions  . Crestor [Rosuvastatin Calcium]     Myalgias  . Lipitor [Atorvastatin]     Myalgia  . Penicillin G Potassium [Penicillin G]   . Sulfur     Social History:  Social History   Socioeconomic History  . Marital status: Single    Spouse name: Not on file  . Number of children: 2  . Years of education: 312  . Highest education level: High school graduate  Occupational History  . Not on file  Tobacco Use  . Smoking status: Never Smoker  . Smokeless tobacco: Never Used  Vaping Use  . Vaping Use: Never used  Substance and Sexual Activity  . Alcohol use: No    Alcohol/week: 0.0 standard drinks  . Drug use: No  . Sexual activity: Not Currently  Other Topics Concern  . Not on file  Social History Narrative   Lives alone   Social Determinants of Health   Financial Resource Strain: Not on file  Food Insecurity: Not on file  Transportation Needs: Not on file  Physical Activity: Not on file  Stress: Not on file  Social Connections: Not on file  Intimate Partner Violence: Not on file   Social History   Tobacco Use  Smoking Status Never Smoker  Smokeless Tobacco Never Used   Social History   Substance and Sexual Activity  Alcohol Use No  . Alcohol/week: 0.0 standard drinks    Family History:  Family History  Problem Relation Age of Onset  . Hypertension Mother   . Hyperlipidemia Mother   . Hyperlipidemia Father     Past medical history, surgical history, medications, allergies, family history and social history reviewed with patient today and changes made to appropriate areas of the chart.   Review of Systems  Constitutional: Positive for malaise/fatigue. Negative for chills, diaphoresis, fever and weight loss.  HENT: Negative.   Eyes: Negative.   Respiratory:  Negative.   Cardiovascular: Negative.   Gastrointestinal: Negative.   Genitourinary: Negative.   Musculoskeletal: Positive for myalgias. Negative for back pain, falls, joint pain and neck pain.  Skin: Negative.   Neurological: Positive for tingling. Negative for dizziness, tremors, sensory change, speech change, focal weakness, seizures, loss of consciousness, weakness and headaches.  Endo/Heme/Allergies: Negative for environmental allergies and polydipsia. Bruises/bleeds easily.  Psychiatric/Behavioral: Negative.    All other ROS negative except what is listed above and in the HPI.     Objective:    BP 104/68   Pulse 74   Temp 97.9 F (36.6 C) (Oral)   Ht 6' (1.829 m)   Wt 269 lb 6.4 oz (122.2 kg)   SpO2  97%   BMI 36.54 kg/m   Wt Readings from Last 3 Encounters:  11/17/20 269 lb 6.4 oz (122.2 kg)  11/07/20 271 lb (122.9 kg)  08/08/20 266 lb 12.8 oz (121 kg)    Physical Exam Vitals and nursing note reviewed.  Constitutional:      General: He is not in acute distress.    Appearance: Normal appearance. He is obese. He is not ill-appearing, toxic-appearing or diaphoretic.  HENT:     Head: Normocephalic and atraumatic.     Right Ear: Tympanic membrane, ear canal and external ear normal. There is no impacted cerumen.     Left Ear: Tympanic membrane, ear canal and external ear normal. There is no impacted cerumen.     Nose: Nose normal. No congestion or rhinorrhea.     Mouth/Throat:     Mouth: Mucous membranes are moist.     Pharynx: Oropharynx is clear. No oropharyngeal exudate or posterior oropharyngeal erythema.  Eyes:     General: No scleral icterus.       Right eye: No discharge.        Left eye: No discharge.     Extraocular Movements: Extraocular movements intact.     Conjunctiva/sclera: Conjunctivae normal.     Pupils: Pupils are equal, round, and reactive to light.  Neck:     Vascular: No carotid bruit.  Cardiovascular:     Rate and Rhythm: Normal rate and  regular rhythm.     Pulses: Normal pulses.     Heart sounds: No murmur heard. No friction rub. No gallop.   Pulmonary:     Effort: Pulmonary effort is normal. No respiratory distress.     Breath sounds: Normal breath sounds. No stridor. No wheezing, rhonchi or rales.  Chest:     Chest wall: No tenderness.  Abdominal:     General: Abdomen is flat. Bowel sounds are normal. There is no distension.     Palpations: Abdomen is soft. There is no mass.     Tenderness: There is no abdominal tenderness. There is no right CVA tenderness, left CVA tenderness, guarding or rebound.     Hernia: No hernia is present.  Genitourinary:    Comments: Genital exam deferred with shared decision making Musculoskeletal:        General: No swelling, tenderness, deformity or signs of injury.     Cervical back: Normal range of motion and neck supple. No rigidity. No muscular tenderness.     Right lower leg: No edema.     Left lower leg: No edema.  Lymphadenopathy:     Cervical: No cervical adenopathy.  Skin:    General: Skin is warm and dry.     Capillary Refill: Capillary refill takes less than 2 seconds.     Coloration: Skin is not jaundiced or pale.     Findings: No bruising, erythema, lesion or rash.  Neurological:     General: No focal deficit present.     Mental Status: He is alert and oriented to person, place, and time.     Cranial Nerves: No cranial nerve deficit.     Sensory: No sensory deficit.     Motor: No weakness.     Coordination: Coordination normal.     Gait: Gait normal.     Deep Tendon Reflexes: Reflexes normal.  Psychiatric:        Mood and Affect: Mood normal.        Behavior: Behavior normal.  Thought Content: Thought content normal.        Judgment: Judgment normal.     Results for orders placed or performed in visit on 11/17/20  Bayer DCA Hb A1c Waived  Result Value Ref Range   HB A1C (BAYER DCA - WAIVED) 9.2 (H) <7.0 %  Urinalysis, Routine w reflex microscopic   Result Value Ref Range   Specific Gravity, UA 1.020 1.005 - 1.030   pH, UA 5.0 5.0 - 7.5   Color, UA Yellow Yellow   Appearance Ur Clear Clear   Leukocytes,UA Negative Negative   Protein,UA Negative Negative/Trace   Glucose, UA 3+ (A) Negative   Ketones, UA Negative Negative   RBC, UA Negative Negative   Bilirubin, UA Negative Negative   Urobilinogen, Ur 0.2 0.2 - 1.0 mg/dL   Nitrite, UA Negative Negative   Microscopic Examination Comment       Assessment & Plan:   Problem List Items Addressed This Visit      Cardiovascular and Mediastinum   Hypertension associated with type 2 diabetes mellitus    Under good control on current regimen. Continue current regimen. Continue to monitor. Call with any concerns. Refills given. Continue 1/2 tab daily. Labs drawn today.        Relevant Medications   olmesartan-hydrochlorothiazide (BENICAR HCT) 40-25 MG tablet   Dulaglutide (TRULICITY) 4.5 MG/0.5ML SOPN   sildenafil (REVATIO) 20 MG tablet   metFORMIN (GLUCOPHAGE) 1000 MG tablet   lovastatin (MEVACOR) 20 MG tablet   ezetimibe (ZETIA) 10 MG tablet   dapagliflozin propanediol (FARXIGA) 10 MG TABS tablet   colesevelam (WELCHOL) 625 MG tablet   Evolocumab (REPATHA SURECLICK) 140 MG/ML SOAJ   Other Relevant Orders   Comprehensive metabolic panel   CBC with Differential/Platelet   TSH     Endocrine   Hyperlipidemia associated with type 2 diabetes mellitus    Still not under great control on 4 agents. Would like to try repatha- rx sent today. If does well, will stop niacin, welchol and zetia and just continue lovastatin. Continue ot monitor. Labs drawn today.      Relevant Medications   olmesartan-hydrochlorothiazide (BENICAR HCT) 40-25 MG tablet   Dulaglutide (TRULICITY) 4.5 MG/0.5ML SOPN   metFORMIN (GLUCOPHAGE) 1000 MG tablet   lovastatin (MEVACOR) 20 MG tablet   ezetimibe (ZETIA) 10 MG tablet   dapagliflozin propanediol (FARXIGA) 10 MG TABS tablet   colesevelam (WELCHOL) 625  MG tablet   Evolocumab (REPATHA SURECLICK) 140 MG/ML SOAJ   Other Relevant Orders   Comprehensive metabolic panel   CBC with Differential/Platelet   Lipid Panel w/o Chol/HDL Ratio   Diabetic peripheral neuropathy associated with type 2 diabetes mellitus    Not under good control with A1c up to 9.2 up from 7.7. Will increase his trulicity to 4.5mg  and recheck 3 months. Continue current regimen. Recheck 3 months. Call with any concerns.       Relevant Medications   olmesartan-hydrochlorothiazide (BENICAR HCT) 40-25 MG tablet   Dulaglutide (TRULICITY) 4.5 MG/0.5ML SOPN   metFORMIN (GLUCOPHAGE) 1000 MG tablet   lovastatin (MEVACOR) 20 MG tablet   dapagliflozin propanediol (FARXIGA) 10 MG TABS tablet   Other Relevant Orders   Bayer DCA Hb A1c Waived (Completed)   Comprehensive metabolic panel   CBC with Differential/Platelet   Urinalysis, Routine w reflex microscopic (Completed)     Musculoskeletal and Integument   Knee osteoarthritis   Relevant Medications   meloxicam (MOBIC) 15 MG tablet    Other Visit Diagnoses  Routine general medical examination at a health care facility    -  Primary   Vaccines up to date. Screening labs checked today. Colonoscopy declined. Continue diet and exercise. Call with any concerns.    Relevant Orders   Bayer DCA Hb A1c Waived (Completed)   Comprehensive metabolic panel   CBC with Differential/Platelet   PSA   TSH   Urinalysis, Routine w reflex microscopic (Completed)   Senile purpura (HCC)       Reassured patient. Continue to monitor.    Relevant Medications   olmesartan-hydrochlorothiazide (BENICAR HCT) 40-25 MG tablet   sildenafil (REVATIO) 20 MG tablet   lovastatin (MEVACOR) 20 MG tablet   ezetimibe (ZETIA) 10 MG tablet   colesevelam (WELCHOL) 625 MG tablet   Evolocumab (REPATHA SURECLICK) 140 MG/ML SOAJ   Hyperlipidemia, unspecified hyperlipidemia type       Relevant Medications   olmesartan-hydrochlorothiazide (BENICAR HCT) 40-25 MG  tablet   sildenafil (REVATIO) 20 MG tablet   lovastatin (MEVACOR) 20 MG tablet   ezetimibe (ZETIA) 10 MG tablet   colesevelam (WELCHOL) 625 MG tablet   Evolocumab (REPATHA SURECLICK) 140 MG/ML SOAJ       Discussed aspirin prophylaxis for myocardial infarction prevention and decision was made to continue ASA  LABORATORY TESTING:  Health maintenance labs ordered today as discussed above.   The natural history of prostate cancer and ongoing controversy regarding screening and potential treatment outcomes of prostate cancer has been discussed with the patient. The meaning of a false positive PSA and a false negative PSA has been discussed. He indicates understanding of the limitations of this screening test and wishes to proceed with screening PSA testing.   IMMUNIZATIONS:   - Tdap: Tetanus vaccination status reviewed: last tetanus booster within 10 years. - Influenza: Up to date - Pneumovax: Up to date - Prevnar: Up to date - COVID: Up to date - Shingrix vaccine: Up to date  SCREENING: - Colonoscopy: Refused  Discussed with patient purpose of the colonoscopy is to detect colon cancer at curable precancerous or early stages   PATIENT COUNSELING:    Sexuality: Discussed sexually transmitted diseases, partner selection, use of condoms, avoidance of unintended pregnancy  and contraceptive alternatives.   Advised to avoid cigarette smoking.  I discussed with the patient that most people either abstain from alcohol or drink within safe limits (<=14/week and <=4 drinks/occasion for males, <=7/weeks and <= 3 drinks/occasion for females) and that the risk for alcohol disorders and other health effects rises proportionally with the number of drinks per week and how often a drinker exceeds daily limits.  Discussed cessation/primary prevention of drug use and availability of treatment for abuse.   Diet: Encouraged to adjust caloric intake to maintain  or achieve ideal body weight, to reduce  intake of dietary saturated fat and total fat, to limit sodium intake by avoiding high sodium foods and not adding table salt, and to maintain adequate dietary potassium and calcium preferably from fresh fruits, vegetables, and low-fat dairy products.    stressed the importance of regular exercise  Injury prevention: Discussed safety belts, safety helmets, smoke detector, smoking near bedding or upholstery.   Dental health: Discussed importance of regular tooth brushing, flossing, and dental visits.   Follow up plan: NEXT PREVENTATIVE PHYSICAL DUE IN 1 YEAR. Return in about 3 months (around 02/15/2021).

## 2020-11-17 NOTE — Assessment & Plan Note (Signed)
Under good control on current regimen. Continue current regimen. Continue to monitor. Call with any concerns. Refills given. Continue 1/2 tab daily. Labs drawn today.

## 2020-11-17 NOTE — Telephone Encounter (Signed)
PA for Repatha initiated and approved via Cover My Meds. Key: Leeann Must

## 2020-11-17 NOTE — Patient Instructions (Addendum)
Health Maintenance After Age 65 After age 65, you are at a higher risk for certain long-term diseases and infections as well as injuries from falls. Falls are a major cause of broken bones and head injuries in people who are older than age 65. Getting regular preventive care can help to keep you healthy and well. Preventive care includes getting regular testing and making lifestyle changes as recommended by your health care provider. Talk with your health care provider about:  Which screenings and tests you should have. A screening is a test that checks for a disease when you have no symptoms.  A diet and exercise plan that is right for you. What should I know about screenings and tests to prevent falls? Screening and testing are the best ways to find a health problem early. Early diagnosis and treatment give you the best chance of managing medical conditions that are common after age 65. Certain conditions and lifestyle choices may make you more likely to have a fall. Your health care provider may recommend:  Regular vision checks. Poor vision and conditions such as cataracts can make you more likely to have a fall. If you wear glasses, make sure to get your prescription updated if your vision changes.  Medicine review. Work with your health care provider to regularly review all of the medicines you are taking, including over-the-counter medicines. Ask your health care provider about any side effects that may make you more likely to have a fall. Tell your health care provider if any medicines that you take make you feel dizzy or sleepy.  Osteoporosis screening. Osteoporosis is a condition that causes the bones to get weaker. This can make the bones weak and cause them to break more easily.  Blood pressure screening. Blood pressure changes and medicines to control blood pressure can make you feel dizzy.  Strength and balance checks. Your health care provider may recommend certain tests to check your  strength and balance while standing, walking, or changing positions.  Foot health exam. Foot pain and numbness, as well as not wearing proper footwear, can make you more likely to have a fall.  Depression screening. You may be more likely to have a fall if you have a fear of falling, feel emotionally low, or feel unable to do activities that you used to do.  Alcohol use screening. Using too much alcohol can affect your balance and may make you more likely to have a fall. What actions can I take to lower my risk of falls? General instructions  Talk with your health care provider about your risks for falling. Tell your health care provider if: ? You fall. Be sure to tell your health care provider about all falls, even ones that seem minor. ? You feel dizzy, sleepy, or off-balance.  Take over-the-counter and prescription medicines only as told by your health care provider. These include any supplements.  Eat a healthy diet and maintain a healthy weight. A healthy diet includes low-fat dairy products, low-fat (lean) meats, and fiber from whole grains, beans, and lots of fruits and vegetables. Home safety  Remove any tripping hazards, such as rugs, cords, and clutter.  Install safety equipment such as grab bars in bathrooms and safety rails on stairs.  Keep rooms and walkways well-lit. Activity   Follow a regular exercise program to stay fit. This will help you maintain your balance. Ask your health care provider what types of exercise are appropriate for you.  If you need a cane or   walker, use it as recommended by your health care provider.  Wear supportive shoes that have nonskid soles. Lifestyle  Do not drink alcohol if your health care provider tells you not to drink.  If you drink alcohol, limit how much you have: ? 0-1 drink a day for women. ? 0-2 drinks a day for men.  Be aware of how much alcohol is in your drink. In the [place] one drink equals one typical bottle of beer (12  oz), one-half glass of wine (5 oz), or one shot of hard liquor (1 oz).  Do not use any products that contain nicotine or tobacco, such as cigarettes and e-cigarettes. If you need help quitting, ask your health care provider. Summary  Having a healthy lifestyle and getting preventive care can help to protect your health and wellness after age 56.  Screening and testing are the best way to find a health problem early and help you avoid having a fall. Early diagnosis and treatment give you the best chance for managing medical conditions that are more common for people who are older than age 49.  Falls are a major cause of broken bones and head injuries in people who are older than age 81. Take precautions to prevent a fall at home.  Work with your health care provider to learn what changes you can make to improve your health and wellness and to prevent falls. This information is not intended to replace advice given to you by your health care provider. Make sure you discuss any questions you have with your health care provider. Document Revised: 03/01/2019 Document Reviewed: 09/21/2017 Elsevier Patient Education  2020 Elsevier Inc. Evolocumab injection What is this medicine? EVOLOCUMAB (e voe LOK ue mab) is known as a PCSK9 inhibitor. It is used to lower the level of cholesterol in the blood. It may be used alone or in combination with other cholesterol-lowering drugs. This drug may also be used to reduce the risk of heart attack, stroke, and certain types of heart surgery in patients with heart disease. This medicine may be used for other purposes; ask your health care provider or pharmacist if you have questions. COMMON BRAND NAME(S): Repatha What should I tell my health care provider before I take this medicine? They need to know if you have any of these conditions:  an unusual or allergic reaction to evolocumab, other medicines, latex, foods, dyes, or preservatives  pregnant or trying to  get pregnant  breast-feeding How should I use this medicine? This medicine is for injection under the skin. You will be taught how to prepare and give this medicine. Use exactly as directed. Take your medicine at regular intervals. Do not take your medicine more often than directed. It is important that you put your used needles and syringes in a special sharps container. Do not put them in a trash can. If you do not have a sharps container, call your pharmacist or health care provider to get one. Talk to your pediatrician regarding the use of this medicine in children. While this drug may be prescribed for children as young as 13 years for selected conditions, precautions do apply. Overdosage: If you think you have taken too much of this medicine contact a poison control center or emergency room at once. NOTE: This medicine is only for you. Do not share this medicine with others. What if I miss a dose? If you miss a dose, take it as soon as you can if there are more than 7  days until the next scheduled dose, or skip the missed dose and take the next dose according to your original schedule. Do not take double or extra doses. What may interact with this medicine? Interactions are not expected. This list may not describe all possible interactions. Give your health care provider a list of all the medicines, herbs, non-prescription drugs, or dietary supplements you use. Also tell them if you smoke, drink alcohol, or use illegal drugs. Some items may interact with your medicine. What should I watch for while using this medicine? Visit your health care provider for regular checks on your progress. Tell your health care provider if your symptoms do not start to get better or if they get worse. You may need blood work done while you are taking this drug. Do not wear the on-body infuser during an MRI. What side effects may I notice from receiving this medicine? Side effects that you should report to your  doctor or health care professional as soon as possible:  allergic reactions like skin rash, itching or hives, swelling of the face, lips, or tongue  signs and symptoms of high blood sugar such as dizziness; dry mouth; dry skin; fruity breath; nausea; stomach pain; increased hunger or thirst; increased urination  signs and symptoms of infection like fever or chills; cough; sore throat; pain or trouble passing urine Side effects that usually do not require medical attention (report to your doctor or health care professional if they continue or are bothersome):  diarrhea  nausea  muscle pain  pain, redness, or irritation at site where injected This list may not describe all possible side effects. Call your doctor for medical advice about side effects. You may report side effects to FDA at 1-800-FDA-1088. Where should I keep my medicine? Keep out of the reach of children. You will be instructed on how to store this medicine. Throw away any unused medicine after the expiration date on the label. NOTE: This sheet is a summary. It may not cover all possible information. If you have questions about this medicine, talk to your doctor, pharmacist, or health care provider.  2020 Elsevier/Gold Standard (2019-09-11 16:22:29) Pneumococcal Conjugate Vaccine (PCV13): What You Need to Know 1. Why get vaccinated? Pneumococcal conjugate vaccine (PCV13) can prevent pneumococcal disease. Pneumococcal disease refers to any illness caused by pneumococcal bacteria. These bacteria can cause many types of illnesses, including pneumonia, which is an infection of the lungs. Pneumococcal bacteria are one of the most common causes of pneumonia. Besides pneumonia, pneumococcal bacteria can also cause:  Ear infections  Sinus infections  Meningitis (infection of the tissue covering the brain and spinal cord)  Bacteremia (bloodstream infection) Anyone can get pneumococcal disease, but children under 2 years of  age, people with certain medical conditions, adults 65 years or older, and cigarette smokers are at the highest risk. Most pneumococcal infections are mild. However, some can result in long-term problems, such as brain damage or hearing loss. Meningitis, bacteremia, and pneumonia caused by pneumococcal disease can be fatal. 2. PCV13 PCV13 protects against 13 types of bacteria that cause pneumococcal disease. Infants and young children usually need 4 doses of pneumococcal conjugate vaccine, at 2, 4, 6, and 53-61 months of age. In some cases, a child might need fewer than 4 doses to complete PCV13 vaccination. A dose of PCV23 vaccine is also recommended for anyone 2 years or older with certain medical conditions if they did not already receive PCV13. This vaccine may be given to adults 65 years  or older based on discussions between the patient and health care provider. 3. Talk with your health care provider Tell your vaccine provider if the person getting the vaccine:  Has had an allergic reaction after a previous dose of PCV13, to an earlier pneumococcal conjugate vaccine known as PCV7, or to any vaccine containing diphtheria toxoid (for example, DTaP), or has any severe, life-threatening allergies.  In some cases, your health care provider may decide to postpone PCV13 vaccination to a future visit. People with minor illnesses, such as a cold, may be vaccinated. People who are moderately or severely ill should usually wait until they recover before getting PCV13. Your health care provider can give you more information. 4. Risks of a vaccine reaction  Redness, swelling, pain, or tenderness where the shot is given, and fever, loss of appetite, fussiness (irritability), feeling tired, headache, and chills can happen after PCV13. Young children may be at increased risk for seizures caused by fever after PCV13 if it is administered at the same time as inactivated influenza vaccine. Ask your health care  provider for more information. People sometimes faint after medical procedures, including vaccination. Tell your provider if you feel dizzy or have vision changes or ringing in the ears. As with any medicine, there is a very remote chance of a vaccine causing a severe allergic reaction, other serious injury, or death. 5. What if there is a serious problem? An allergic reaction could occur after the vaccinated person leaves the clinic. If you see signs of a severe allergic reaction (hives, swelling of the face and throat, difficulty breathing, a fast heartbeat, dizziness, or weakness), call 9-1-1 and get the person to the nearest hospital. For other signs that concern you, call your health care provider. Adverse reactions should be reported to the Vaccine Adverse Event Reporting System (VAERS). Your health care provider will usually file this report, or you can do it yourself. Visit the VAERS website at www.vaers.LAgents.no or call 6286014282. VAERS is only for reporting reactions, and VAERS staff do not give medical advice. 6. The National Vaccine Injury Compensation Program The [employer] Vaccine Injury Compensation Program (VICP) is a federal program that was created to compensate people who may have been injured by certain vaccines. Visit the VICP website at SpiritualWord.at or call (219)126-3713 to learn about the program and about filing a claim. There is a time limit to file a claim for compensation. 7. How can I learn more?  Ask your health care provider.  Call your local or state health department.  Contact the Centers for Disease Control and Prevention (CDC): ? Call 916 445 1608 (1-800-CDC-INFO) or ? Visit CDC's website at PicCapture.uy Vaccine Information Statement PCV13 Vaccine (09/20/2018) This information is not intended to replace advice given to you by your health care provider. Make sure you discuss any questions you have with your health care  provider. Document Revised: 02/27/2019 Document Reviewed: 06/20/2018 Elsevier Patient Education  2020 [employer].

## 2020-11-18 ENCOUNTER — Other Ambulatory Visit: Payer: Self-pay | Admitting: Family Medicine

## 2020-11-18 DIAGNOSIS — R972 Elevated prostate specific antigen [PSA]: Secondary | ICD-10-CM

## 2020-11-18 LAB — CBC WITH DIFFERENTIAL/PLATELET
Basophils Absolute: 0.1 10*3/uL (ref 0.0–0.2)
Basos: 1 %
EOS (ABSOLUTE): 0.2 10*3/uL (ref 0.0–0.4)
Eos: 3 %
Hematocrit: 47.9 % (ref 37.5–51.0)
Hemoglobin: 16.1 g/dL (ref 13.0–17.7)
Immature Grans (Abs): 0 10*3/uL (ref 0.0–0.1)
Immature Granulocytes: 0 %
Lymphocytes Absolute: 1.9 10*3/uL (ref 0.7–3.1)
Lymphs: 26 %
MCH: 28.9 pg (ref 26.6–33.0)
MCHC: 33.6 g/dL (ref 31.5–35.7)
MCV: 86 fL (ref 79–97)
Monocytes Absolute: 0.7 10*3/uL (ref 0.1–0.9)
Monocytes: 9 %
Neutrophils Absolute: 4.5 10*3/uL (ref 1.4–7.0)
Neutrophils: 61 %
Platelets: 277 10*3/uL (ref 150–450)
RBC: 5.58 x10E6/uL (ref 4.14–5.80)
RDW: 13.1 % (ref 11.6–15.4)
WBC: 7.4 10*3/uL (ref 3.4–10.8)

## 2020-11-18 LAB — TSH: TSH: 1.65 u[IU]/mL (ref 0.450–4.500)

## 2020-11-18 LAB — COMPREHENSIVE METABOLIC PANEL
ALT: 29 IU/L (ref 0–44)
AST: 21 IU/L (ref 0–40)
Albumin/Globulin Ratio: 1.3 (ref 1.2–2.2)
Albumin: 4.2 g/dL (ref 3.8–4.8)
Alkaline Phosphatase: 101 IU/L (ref 44–121)
BUN/Creatinine Ratio: 14 (ref 10–24)
BUN: 13 mg/dL (ref 8–27)
Bilirubin Total: 0.4 mg/dL (ref 0.0–1.2)
CO2: 21 mmol/L (ref 20–29)
Calcium: 9.3 mg/dL (ref 8.6–10.2)
Chloride: 103 mmol/L (ref 96–106)
Creatinine, Ser: 0.92 mg/dL (ref 0.76–1.27)
GFR calc Af Amer: 101 mL/min/{1.73_m2} (ref >59)
GFR calc non Af Amer: 87 mL/min/{1.73_m2} (ref >59)
Globulin, Total: 3.3 g/dL (ref 1.5–4.5)
Glucose: 166 mg/dL — ABNORMAL HIGH (ref 65–99)
Potassium: 4.3 mmol/L (ref 3.5–5.2)
Sodium: 139 mmol/L (ref 134–144)
Total Protein: 7.5 g/dL (ref 6.0–8.5)

## 2020-11-18 LAB — LIPID PANEL W/O CHOL/HDL RATIO
Cholesterol, Total: 192 mg/dL (ref 100–199)
HDL: 38 mg/dL — ABNORMAL LOW (ref >39)
LDL Chol Calc (NIH): 124 mg/dL — ABNORMAL HIGH (ref 0–99)
Triglycerides: 165 mg/dL — ABNORMAL HIGH (ref 0–149)
VLDL Cholesterol Cal: 30 mg/dL (ref 5–40)

## 2020-11-18 LAB — PSA: Prostate Specific Ag, Serum: 8.4 ng/mL — ABNORMAL HIGH (ref 0.0–4.0)

## 2020-11-19 DIAGNOSIS — E113393 Type 2 diabetes mellitus with moderate nonproliferative diabetic retinopathy without macular edema, bilateral: Secondary | ICD-10-CM | POA: Diagnosis not present

## 2020-12-18 ENCOUNTER — Telehealth: Payer: Self-pay

## 2020-12-18 NOTE — Telephone Encounter (Signed)
[facility] has requested Casey Hunter 2nd Shingles Vaccine  Script. Please forward to the pharmacy. If granted. Thank you.   Call back ph [phone].

## 2020-12-19 ENCOUNTER — Other Ambulatory Visit: Payer: BC Managed Care – PPO

## 2020-12-19 ENCOUNTER — Other Ambulatory Visit: Payer: Self-pay

## 2020-12-19 DIAGNOSIS — R972 Elevated prostate specific antigen [PSA]: Secondary | ICD-10-CM

## 2020-12-20 LAB — PSA: Prostate Specific Ag, Serum: 2.1 ng/mL (ref 0.0–4.0)

## 2020-12-22 ENCOUNTER — Other Ambulatory Visit: Payer: Self-pay | Admitting: Physical Medicine and Rehabilitation

## 2020-12-22 MED ORDER — TAMSULOSIN HCL 0.4 MG PO CAPS: 0.4000 mg | ORAL_CAPSULE | Freq: Every day | ORAL | 2 refills | Status: DC

## 2021-01-03 ENCOUNTER — Telehealth: Payer: Self-pay | Admitting: Physical Medicine and Rehabilitation

## 2021-01-06 NOTE — Telephone Encounter (Signed)
Yes that is ok

## 2021-01-06 NOTE — Telephone Encounter (Signed)
Called in.

## 2021-01-06 NOTE — Telephone Encounter (Signed)
Refill request for Pregabalin. No indication in last note to continue. Is it okay to refill?

## 2021-01-30 ENCOUNTER — Ambulatory Visit: Payer: BC Managed Care – PPO | Admitting: Internal Medicine

## 2021-02-05 ENCOUNTER — Encounter
Payer: BC Managed Care – PPO | Attending: Physical Medicine and Rehabilitation | Admitting: Physical Medicine and Rehabilitation

## 2021-02-05 ENCOUNTER — Encounter: Payer: Self-pay | Admitting: Physical Medicine and Rehabilitation

## 2021-02-05 ENCOUNTER — Ambulatory Visit: Payer: BC Managed Care – PPO | Admitting: Physical Medicine and Rehabilitation

## 2021-02-05 ENCOUNTER — Other Ambulatory Visit: Payer: Self-pay

## 2021-02-05 VITALS — BP 116/77 | HR 89 | Temp 97.8°F | Ht 72.0 in | Wt 268.2 lb

## 2021-02-05 DIAGNOSIS — R200 Anesthesia of skin: Secondary | ICD-10-CM | POA: Insufficient documentation

## 2021-02-05 DIAGNOSIS — E559 Vitamin D deficiency, unspecified: Secondary | ICD-10-CM | POA: Insufficient documentation

## 2021-02-05 DIAGNOSIS — N401 Enlarged prostate with lower urinary tract symptoms: Secondary | ICD-10-CM

## 2021-02-05 DIAGNOSIS — R5383 Other fatigue: Secondary | ICD-10-CM | POA: Diagnosis not present

## 2021-02-05 DIAGNOSIS — E1142 Type 2 diabetes mellitus with diabetic polyneuropathy: Secondary | ICD-10-CM | POA: Diagnosis not present

## 2021-02-05 DIAGNOSIS — Z8616 Personal history of COVID-19: Secondary | ICD-10-CM | POA: Diagnosis not present

## 2021-02-05 MED ORDER — OLMESARTAN MEDOXOMIL-HCTZ 20-12.5 MG PO TABS: 1.0000 | ORAL_TABLET | Freq: Every day | ORAL | 1 refills | Status: DC

## 2021-02-05 MED ORDER — NAC 600 600 MG PO CAPS: 1.0000 | ORAL_CAPSULE | Freq: Two times a day (BID) | ORAL | 3 refills | Status: DC

## 2021-02-05 NOTE — Progress Notes (Signed)
Subjective:    Patient ID: Casey Greenspan., male    DOB: Jul 18, 1956, 65 y.o.   MRN: [id number]  HPI  Casey Hunter returns for follow-up of LONG COVID symptoms.   1) Long COVID symptoms -left leg still falls asleep -some nights it can be pretty bad -it is not usually painful -he is trying to work on his diet. -he is taking the Lyrica two at night before sleeping  2) Diabetic peripheral neuropathy: -both feet feel cold -His CBGs have been harder to control since COVID -discussed his diet thoroughly.   3) BPH: -the tamsulosin has been helping him.  4) Fatigue -Contnues to have severe fatigue at times.   He continues to have numbness in his left calf. It is better than initially. His left hand will also sleep. It hurts but he is not sure if this is pain.  He has been having increase readings on his Freestyle Libre device- around 200s. For breakfast he eats eggs and sausage. For lunch he eats barebcue and sometimes french fries. Once in a while he eats sugar. He drinks water and unsweet tea and a fruit drink. He eats two bananas every day.   He has been checking pressures every day and they have been well controlled. He takes his BP medication 5 days per week.  Prior history: Dizziness is better, but still present at times. He has brought with him an excellent log of his AM and PM blood pressures, which are excellent off any blood pressure medications.   Obesity: He has made excellent changes to his diet. He has lost 2 lbs since last visit (currently 266 lbs). He has been eating one banana per day, eggs daily, salmon once per week, meat, and a lot of nuts. He feels he still needs to minimize sweets.   Energy: Has greatly improved but is still not at the level required for his work. He has weaned off the Ritalin. Discussed goal of restarting work on October 11th for three days per week and he is agreeable.   Reviewed PCP note and he has been doing much better. HbA1c has improved to  7.7  Left leg numbness continues but is slightly improved.     Pain Inventory Average Pain 5 Pain Right Now 5 My pain is intermittent, burning, dull and tingling  In the last 24 hours, has pain interfered with the following? General activity 5 Relation with others 2 Enjoyment of life 4 What TIME of day is your pain at its worst? morning , daytime, evening and night Sleep (in general) Good  Pain is worse with: walking, standing and some activites Pain improves with: rest and medication Relief from Meds: 5  Family History  Problem Relation Age of Onset  . Hypertension Mother   . Hyperlipidemia Mother   . Hyperlipidemia Father    Social History   Socioeconomic History  . Marital status: Single    Spouse name: Not on file  . Number of children: 2  . Years of education: 77  . Highest education level: High school graduate  Occupational History  . Not on file  Tobacco Use  . Smoking status: Never Smoker  . Smokeless tobacco: Never Used  Vaping Use  . Vaping Use: Never used  Substance and Sexual Activity  . Alcohol use: No    Alcohol/week: 0.0 standard drinks  . Drug use: No  . Sexual activity: Not Currently  Other Topics Concern  . Not on file  Social History  Narrative   Lives alone   Social Determinants of Health   Financial Resource Strain: Not on file  Food Insecurity: Not on file  Transportation Needs: Not on file  Physical Activity: Not on file  Stress: Not on file  Social Connections: Not on file   Past Surgical History:  Procedure Laterality Date  . HERNIA REPAIR     Past Surgical History:  Procedure Laterality Date  . HERNIA REPAIR     Past Medical History:  Diagnosis Date  . Diabetic peripheral neuropathy associated with type 2 diabetes mellitus 03/14/2017  . DOE (dyspnea on exertion) 11/2019   Onset jan 2021 with covid 19  - assoc with atypical cp developed during the infection present mostly sitting/ absent supine/ better with deep  breathing/ no worse with ex - 01/30/2020   Walked RA x two laps =  approx 578ft @ brisk pace - stopped due to end of study, no sob/ no change cp  with sats of 93 % at the end of the study and no acute ekg changes  - Echo  02/13/2020  wnl with  no pericadial ef  . Fatigue 11/2019  . History of COVID-19 11/2019  . Hyperlipidemia associated with type 2 diabetes mellitus 09/16/2015  . Hypertension associated with type 2 diabetes mellitus 09/16/2015  . Knee osteoarthritis 04/14/2016  . Mild neurocognitive disorder 06/12/2020  . Numbness of lower extremity 11/2019   Attributed to COVID-19  . Sleep apnea    Patient denied being diagnosed with OSA in the past   There were no vitals taken for this visit.  Opioid Risk Score:   Fall Risk Score:  `1  Depression screen PHQ 2/9  Depression screen [facility] 2/9 11/17/2020 08/08/2020 05/02/2020 04/24/2020 12/06/2019 04/24/2018 01/03/2018  Decreased Interest 0 0 2 2 0 0 0  Down, Depressed, Hopeless 0 0 0 0 0 0 0  PHQ - 2 Score 0 0 2 2 0 0 0  Altered sleeping - - 0 - - - -  Tired, decreased energy - - 2 - - - -  Change in appetite - - 0 - - - -  Feeling bad or failure about yourself  - - 0 - - - -  Trouble concentrating - - 0 - - - -  Moving slowly or fidgety/restless - - 1 - - - -  Suicidal thoughts - - 0 - - - -  PHQ-9 Score - - 5 - - - -  Difficult doing work/chores - - Somewhat difficult - - - -   Review of Systems  Constitutional: Negative.   HENT: Negative.   Eyes: Negative.   Respiratory: Negative.   Cardiovascular: Negative.   Gastrointestinal: Negative.   Endocrine: Negative.   Genitourinary: Negative.   Musculoskeletal: Positive for arthralgias and gait problem.  Skin: Negative.   Allergic/Immunologic: Negative.   Hematological: Negative.   Psychiatric/Behavioral: Negative.   All other systems reviewed and are negative.      Objective:   Physical Exam Gen: no distress, normal appearing HEENT: oral mucosa pink and moist, NCAT Cardio: Reg  rate Chest: normal effort, normal rate of breathing Abd: soft, non-distended Ext: no edema Psych: pleasant, normal affect Skin: intact Psych: pleasant, normal affect Neuro: Alert and oriented x3. Normal gait. No decreased sensation.     Assessment & Plan:  Casey Hunter is a 65 year old man who presents for follow-up on LONG COVID symptoms.   1) HTN: BP has been well controlled at home and is  well controlled in office today. Finish current BP medication. If BP remains well controlled, I have sent lower dose of Benicar  2) Fatigue: Improved but still present. Prescribed NAC 600mg  BID and gave list of foods that increase NAC. Off Ritalin. Increase work to 4 days per week until re-eval on April 1st. Provided with note.   3) Low vitamin D: Repeat level today.   4) Leg numbness: Persists, but improved. Explained etiologies of nerve injury 2/2 COVID-19 vs. Diabetic neuropathy.   5) Obesity: Weight 268- lost 3 lbs since last visit! He eats salmon once per week, eggs daily, nuts, meat, olive oil. Made goal to avoid sugar. Discussed benefits of intermittent fasting.  DM 2: HgbA1c reviewed. 7.7- improved from before. PCP note reviewed- overall improved.   General health: He has received Shingles and pneumococcal vaccines, early flu shot, and 2nd COVID booster.   All questions answered. RTC in 3 months.   40 minutes spent in providing dietary counseling, discussing leg numbness etiology and prognosis, discussion of BP and plan to titrate down medication in future, discussion of response to Flomax, ordering vitamin D level, discussion of weight and benefits of intermittent fasting, providing note for work

## 2021-02-06 LAB — PSA: Prostate Specific Ag, Serum: 1.7 ng/mL (ref 0.0–4.0)

## 2021-02-06 LAB — VITAMIN D 25 HYDROXY (VIT D DEFICIENCY, FRACTURES): Vit D, 25-Hydroxy: 22.8 ng/mL — ABNORMAL LOW (ref 30.0–100.0)

## 2021-02-06 MED ORDER — VITAMIN D (ERGOCALCIFEROL) 1.25 MG (50000 UNIT) PO CAPS: 50000.0000 [IU] | ORAL_CAPSULE | ORAL | 0 refills | Status: DC

## 2021-02-06 NOTE — Addendum Note (Signed)
Addended by: Horton Chin on: 02/06/2021 08:27 AM   Modules accepted: Orders

## 2021-02-10 ENCOUNTER — Other Ambulatory Visit: Payer: Self-pay | Admitting: Physical Medicine and Rehabilitation

## 2021-02-10 MED ORDER — LOSARTAN POTASSIUM 25 MG PO TABS: 25.0000 mg | ORAL_TABLET | Freq: Every day | ORAL | 11 refills | Status: DC

## 2021-02-20 ENCOUNTER — Ambulatory Visit: Payer: BC Managed Care – PPO | Admitting: Family Medicine

## 2021-03-05 ENCOUNTER — Other Ambulatory Visit: Payer: Self-pay | Admitting: Physical Medicine and Rehabilitation

## 2021-03-26 ENCOUNTER — Other Ambulatory Visit: Payer: Self-pay | Admitting: Physical Medicine and Rehabilitation

## 2021-03-26 MED ORDER — VITAMIN D (ERGOCALCIFEROL) 1.25 MG (50000 UNIT) PO CAPS: 50000.0000 [IU] | ORAL_CAPSULE | ORAL | 0 refills | Status: DC

## 2021-03-26 MED ORDER — FAMOTIDINE 40 MG PO TABS: 80.0000 mg | ORAL_TABLET | Freq: Three times a day (TID) | ORAL | 0 refills | Status: DC

## 2021-03-27 ENCOUNTER — Other Ambulatory Visit: Payer: Self-pay | Admitting: Physical Medicine and Rehabilitation

## 2021-03-27 MED ORDER — DICLOFENAC SODIUM 1 % EX GEL: 4.0000 g | Freq: Four times a day (QID) | CUTANEOUS | 3 refills | Status: DC

## 2021-03-27 MED ORDER — LIDOCAINE 5 % EX PTCH: 1.0000 | MEDICATED_PATCH | Freq: Two times a day (BID) | CUTANEOUS | 0 refills | Status: DC

## 2021-04-03 ENCOUNTER — Other Ambulatory Visit: Payer: Self-pay | Admitting: Physical Medicine and Rehabilitation

## 2021-04-03 MED ORDER — PREGABALIN 75 MG PO CAPS: 75.0000 mg | ORAL_CAPSULE | Freq: Three times a day (TID) | ORAL | 0 refills | Status: DC

## 2021-04-07 ENCOUNTER — Other Ambulatory Visit: Payer: Self-pay | Admitting: Family Medicine

## 2021-04-07 NOTE — Telephone Encounter (Signed)
Requested medications are due for refill today Should have refills left  Requested medications are on the active medication list yes  Last refill 2/23  Last visit 11/17/20  Future visit scheduled No  Notes to clinic Was to return in 3 months with this new medication. Asking for med when should have several months of refill left on current rx. No upcoming visit scheduled for March as instructed, also not for yearly, please assess.

## 2021-04-07 NOTE — Telephone Encounter (Signed)
Lvm to make this apt. 

## 2021-04-07 NOTE — Telephone Encounter (Signed)
Pt was suppose to Return in about 3 months (around 02/15/2021). Will call to make apt

## 2021-04-08 NOTE — Telephone Encounter (Signed)
Lvm to make this apt. 

## 2021-04-09 ENCOUNTER — Encounter: Payer: Self-pay | Admitting: Family Medicine

## 2021-04-09 NOTE — Telephone Encounter (Signed)
Lvm to make this apt. Sent letter.  

## 2021-04-24 ENCOUNTER — Encounter: Payer: Self-pay | Admitting: Internal Medicine

## 2021-04-24 ENCOUNTER — Other Ambulatory Visit: Payer: Self-pay

## 2021-04-24 ENCOUNTER — Ambulatory Visit (INDEPENDENT_AMBULATORY_CARE_PROVIDER_SITE_OTHER): Payer: BC Managed Care – PPO | Admitting: Internal Medicine

## 2021-04-24 ENCOUNTER — Ambulatory Visit: Payer: BC Managed Care – PPO | Admitting: Physical Medicine and Rehabilitation

## 2021-04-24 VITALS — BP 132/78 | HR 79 | Temp 97.7°F | Resp 18 | Ht 72.0 in | Wt 267.4 lb

## 2021-04-24 DIAGNOSIS — U099 Post covid-19 condition, unspecified: Secondary | ICD-10-CM

## 2021-04-24 DIAGNOSIS — E1142 Type 2 diabetes mellitus with diabetic polyneuropathy: Secondary | ICD-10-CM

## 2021-04-24 DIAGNOSIS — E785 Hyperlipidemia, unspecified: Secondary | ICD-10-CM | POA: Diagnosis not present

## 2021-04-24 DIAGNOSIS — E1169 Type 2 diabetes mellitus with other specified complication: Secondary | ICD-10-CM

## 2021-04-24 DIAGNOSIS — E118 Type 2 diabetes mellitus with unspecified complications: Secondary | ICD-10-CM | POA: Diagnosis not present

## 2021-04-24 DIAGNOSIS — E538 Deficiency of other specified B group vitamins: Secondary | ICD-10-CM

## 2021-04-24 DIAGNOSIS — R2990 Unspecified symptoms and signs involving the nervous system: Secondary | ICD-10-CM | POA: Diagnosis not present

## 2021-04-24 DIAGNOSIS — E1159 Type 2 diabetes mellitus with other circulatory complications: Secondary | ICD-10-CM

## 2021-04-24 DIAGNOSIS — M170 Bilateral primary osteoarthritis of knee: Secondary | ICD-10-CM

## 2021-04-24 DIAGNOSIS — R5383 Other fatigue: Secondary | ICD-10-CM

## 2021-04-24 DIAGNOSIS — I152 Hypertension secondary to endocrine disorders: Secondary | ICD-10-CM

## 2021-04-24 LAB — CBC
HCT: 44.1 % (ref 39.0–52.0)
Hemoglobin: 14.8 g/dL (ref 13.0–17.0)
MCHC: 33.5 g/dL (ref 30.0–36.0)
MCV: 84.2 fl (ref 78.0–100.0)
Platelets: 246 10*3/uL (ref 150.0–400.0)
RBC: 5.24 Mil/uL (ref 4.22–5.81)
RDW: 14.6 % (ref 11.5–15.5)
WBC: 9 10*3/uL (ref 4.0–10.5)

## 2021-04-24 LAB — TSH: TSH: 1.3 u[IU]/mL (ref 0.35–4.50)

## 2021-04-24 LAB — VITAMIN D 25 HYDROXY (VIT D DEFICIENCY, FRACTURES): VITD: 34.32 ng/mL (ref 30.00–100.00)

## 2021-04-24 LAB — VITAMIN B12: Vitamin B-12: 275 pg/mL (ref 211–911)

## 2021-04-24 LAB — POCT GLYCOSYLATED HEMOGLOBIN (HGB A1C): Hemoglobin A1C: 8.1 % — AB (ref 4.0–5.6)

## 2021-04-24 MED ORDER — REPATHA SURECLICK 140 MG/ML ~~LOC~~ SOAJ: 140.0000 mg | SUBCUTANEOUS | 3 refills | Status: DC

## 2021-04-24 MED ORDER — TRULICITY 4.5 MG/0.5ML ~~LOC~~ SOAJ: 4.5000 mg | SUBCUTANEOUS | 3 refills | Status: DC

## 2021-04-24 NOTE — Progress Notes (Signed)
   Subjective:   Patient ID: Casey Hunter., male    DOB: [birth year], 65 y.o.   MRN: [id number]  HPI The patient is a new 65 YO man coming in for ongoing care diabetes (taking trulicity and farxiga and metformin, on losartan and lovastatin, on lyrica for neuropathy) and cholesterol (on lovastatin and repatha, just started repatha Jan 2022 and no repeat labs yet) and B12 deficiency (prior injections not taking otc currently, has neuropathy, does not think it helped with that) and post covid syndrome (still mild mental fogging, fatigue, had covid again May 2022 initial severe episode 2021 early, has neuropathy as well in left leg predominantly).   PMH, [facility], social history reviewed and updated  Review of Systems  Constitutional: Positive for activity change and fatigue.  HENT: Negative.   Eyes: Negative.   Respiratory: Negative for cough, chest tightness and shortness of breath.   Cardiovascular: Negative for chest pain, palpitations and leg swelling.  Gastrointestinal: Positive for diarrhea. Negative for abdominal distention, abdominal pain, constipation, nausea and vomiting.  Musculoskeletal: Positive for arthralgias.  Skin: Negative.   Neurological: Positive for numbness.  Psychiatric/Behavioral: Negative.     Objective:  Physical Exam Constitutional:      Appearance: He is well-developed. He is obese.  HENT:     Head: Normocephalic and atraumatic.  Cardiovascular:     Rate and Rhythm: Normal rate and regular rhythm.  Pulmonary:     Effort: Pulmonary effort is normal. No respiratory distress.     Breath sounds: Normal breath sounds. No wheezing or rales.  Abdominal:     General: Bowel sounds are normal. There is no distension.     Palpations: Abdomen is soft.     Tenderness: There is no abdominal tenderness. There is no rebound.  Musculoskeletal:     Cervical back: Normal range of motion.  Skin:    General: Skin is warm and dry.  Neurological:     Mental Status: He is  alert and oriented to person, place, and time. Mental status is at baseline.     Cranial Nerves: Cranial nerve deficit present.     Coordination: Coordination normal.     Vitals:   04/24/21 0847  BP: 132/78  Pulse: 79  Resp: 18  Temp: 97.7 F (36.5 C)  TempSrc: Oral  SpO2: 93%  Weight: 267 lb 6.4 oz (121.3 kg)  Height: 6' (1.829 m)    This visit occurred during the SARS-CoV-2 public health emergency.  Safety protocols were in place, including screening questions prior to the visit, additional usage of staff PPE, and extensive cleaning of exam room while observing appropriate contact time as indicated for disinfecting solutions.   Assessment & Plan:

## 2021-04-24 NOTE — Assessment & Plan Note (Signed)
With some neuropathy and mental fogging. Overall some improvement. Some setback with getting covid-19 again in May 2022. Checking TSH, B12, vitamin D to assess for metabolic causes.

## 2021-04-24 NOTE — Assessment & Plan Note (Signed)
Taking lyrica 75 mg TID. Can increase dosing if needed. Checking TSH, vitamin D, B12, CBC for alternate contributing cause also.

## 2021-04-24 NOTE — Assessment & Plan Note (Signed)
Taking meloxicam as needed.

## 2021-04-24 NOTE — Patient Instructions (Addendum)
We will recheck the labs today. The HgA1c was 8.1 today.  Check on the cologuard if you are interested.    Diabetes Mellitus and Standards of Medical Care Living with and managing diabetes (diabetes mellitus) can be complicated. Your diabetes treatment may be managed by a team of health care providers, including:  A physician who specializes in diabetes (endocrinologist). You might also have visits with a nurse practitioner or physician assistant.  Nurses.  A registered dietitian.  A certified diabetes care and education specialist.  An exercise specialist.  A pharmacist.  An eye doctor.  A foot specialist (podiatrist).  A dental care provider.  A primary care provider.  A mental health care provider. How to manage your diabetes You can do many things to successfully manage your diabetes. Your health care providers will follow guidelines to help you get the best quality of care. Here are general guidelines for your diabetes management plan. Your health care providers may give you more specific instructions. Physical exams When you are diagnosed with diabetes, and each year after that, your health care provider will ask about your medical and family history. You will have a physical exam, which may include:  Measuring your height, weight, and body mass index (BMI).  Checking your blood pressure. This will be done at every routine medical visit. Your target blood pressure may vary depending on your medical conditions, your age, and other factors.  A thyroid exam.  A skin exam.  Screening for nerve damage (peripheral neuropathy). This may include checking the pulse in your legs and feet and the level of sensation in your hands and feet.  A foot exam to inspect the structure and skin of your feet, including checking for cuts, bruises, redness, blisters, sores, or other problems.  Screening for blood vessel (vascular) problems. This may include checking the pulse in your legs  and feet and checking your temperature. Blood tests Depending on your treatment plan and your personal needs, you may have the following tests:  Hemoglobin A1C (HbA1C). This test provides information about blood sugar (glucose) control over the previous 2-3 months. It is used to adjust your treatment plan, if needed. This test will be done: ? At least 2 times a year, if you are meeting your treatment goals. ? 4 times a year, if you are not meeting your treatment goals or if your goals have changed.  Lipid testing, including total cholesterol, LDL and HDL cholesterol, and triglyceride levels. ? The goal for LDL is less than 100 mg/dL (5.5 mmol/L). If you are at high risk for complications, the goal is less than 70 mg/dL (3.9 mmol/L). ? The goal for HDL is 40 mg/dL (2.2 mmol/L) or higher for men, and 50 mg/dL (2.8 mmol/L) or higher for women. An HDL cholesterol of 60 mg/dL (3.3 mmol/L) or higher gives some protection against heart disease. ? The goal for triglycerides is less than 150 mg/dL (8.3 mmol/L).  Liver function tests.  Kidney function tests.  Thyroid function tests.   Dental and eye exams  Visit your dentist two times a year.  If you have type 1 diabetes, your health care provider may recommend an eye exam within 5 years after you are diagnosed, and then once a year after your first exam. ? For children with type 1 diabetes, the health care provider may recommend an eye exam when your child is age 16 or older and has had diabetes for 3-5 years. After the first exam, your child should get  an eye exam once a year.  If you have type 2 diabetes, your health care provider may recommend an eye exam as soon as you are diagnosed, and then every 1-2 years after your first exam.   Immunizations  A yearly flu (influenza) vaccine is recommended annually for everyone 6 months or older. This is especially important if you have diabetes.  The pneumonia (pneumococcal) vaccine is recommended for  everyone 2 years or older who has diabetes. If you are age 65 or older, you may get the pneumonia vaccine as a series of two separate shots.  The hepatitis B vaccine is recommended for adults shortly after being diagnosed with diabetes. Adults and children with diabetes should receive all other vaccines according to age-specific recommendations from the Centers for Disease Control and Prevention (CDC). Mental and emotional health Screening for symptoms of eating disorders, anxiety, and depression is recommended at the time of diagnosis and after as needed. If your screening shows that you have symptoms, you may need more evaluation. You may work with a mental health care provider. Follow these instructions at home: Treatment plan You will monitor your blood glucose levels and may give yourself insulin. Your treatment plan will be reviewed at every medical visit. You and your health care provider will discuss:  How you are taking your medicines, including insulin.  Any side effects you have.  Your blood glucose level target goals.  How often you monitor your blood glucose level.  Lifestyle habits, such as activity level and tobacco, alcohol, and substance use. Education Your health care provider will assess how well you are monitoring your blood glucose levels and whether you are taking your insulin and medicines correctly. He or she may refer you to:  A certified diabetes care and education specialist to manage your diabetes throughout your life, starting at diagnosis.  A registered dietitian who can create and review your personal nutrition plan.  An exercise specialist who can discuss your activity level and exercise plan. General instructions  Take over-the-counter and prescription medicines only as told by your health care provider.  Keep all follow-up visits. This is important. Where to find support There are many diabetes support networks, including:  American Diabetes  Association (ADA): diabetes.org  Defeat Diabetes Foundation: defeatdiabetes.org Where to find more information  American Diabetes Association (ADA): www.diabetes.org  Association of Diabetes Care & Education Specialists (ADCES): diabeteseducator.org  International Diabetes Federation (IDF): http://hill.biz/ Summary  Managing diabetes (diabetes mellitus) can be complicated. Your diabetes treatment may be managed by a team of health care providers.  Your health care providers follow guidelines to help you get the best quality care.  You should have physical exams, blood tests, blood pressure monitoring, immunizations, and screening tests regularly. Stay updated on how to manage your diabetes.  Your health care providers may also give you more specific instructions based on your individual health. This information is not intended to replace advice given to you by your health care provider. Make sure you discuss any questions you have with your health care provider. Document Revised: 05/15/2020 Document Reviewed: 05/15/2020 Elsevier Patient Education  2021 [employer].

## 2021-04-24 NOTE — Assessment & Plan Note (Signed)
Checked POC HgA1c 8.1 today which is improved from before. He would like to continue with metformin and farxiga and trulicity. He is working on diet and losing weight. On ARB and statin.

## 2021-04-24 NOTE — Assessment & Plan Note (Signed)
Checking TSH, vitamin D and B12, CBC, CMP for cause and treat as appropriate.

## 2021-04-24 NOTE — Assessment & Plan Note (Signed)
BP at goal on losartan 25 mg daily. Due to concurrent diabetes recommended to continue on this.

## 2021-04-24 NOTE — Assessment & Plan Note (Signed)
Given B12 shot in the past. Checking B12 level today. Not currently on supplementation.

## 2021-04-27 LAB — COMPREHENSIVE METABOLIC PANEL
ALT: 25 U/L (ref 0–53)
AST: 29 U/L (ref 0–37)
Albumin: 3.9 g/dL (ref 3.5–5.2)
Alkaline Phosphatase: 68 U/L (ref 39–117)
BUN: 13 mg/dL (ref 6–23)
CO2: 19 mEq/L (ref 19–32)
Calcium: 9.1 mg/dL (ref 8.4–10.5)
Chloride: 104 mEq/L (ref 96–112)
Creatinine, Ser: 0.7 mg/dL (ref 0.40–1.50)
GFR: 96.59 mL/min (ref >60.00)
Glucose, Bld: 135 mg/dL — ABNORMAL HIGH (ref 70–99)
Potassium: 4.3 mEq/L (ref 3.5–5.1)
Sodium: 136 mEq/L (ref 135–145)
Total Bilirubin: 0.4 mg/dL (ref 0.2–1.2)
Total Protein: 7.8 g/dL (ref 6.0–8.3)

## 2021-04-27 LAB — LIPID PANEL
Cholesterol: 166 mg/dL (ref 0–200)
HDL: 42.9 mg/dL (ref >39.00)
LDL Cholesterol: 94 mg/dL (ref 0–99)
NonHDL: 123.53
Total CHOL/HDL Ratio: 4
Triglycerides: 146 mg/dL (ref 0.0–149.0)
VLDL: 29.2 mg/dL (ref 0.0–40.0)

## 2021-05-01 ENCOUNTER — Telehealth: Payer: Self-pay | Admitting: Internal Medicine

## 2021-05-01 NOTE — Telephone Encounter (Signed)
Labs have not been resulted. Once Dr. Okey Dupre reviews them someone from our office will call with the results. Patient made aware.

## 2021-05-01 NOTE — Telephone Encounter (Signed)
    Please call patient to discuss lab results 

## 2021-05-02 ENCOUNTER — Other Ambulatory Visit: Payer: Self-pay | Admitting: Physical Medicine and Rehabilitation

## 2021-05-02 MED ORDER — PREGABALIN 75 MG PO CAPS: 75.0000 mg | ORAL_CAPSULE | Freq: Three times a day (TID) | ORAL | 0 refills | Status: DC

## 2021-05-04 ENCOUNTER — Other Ambulatory Visit: Payer: Self-pay

## 2021-05-04 ENCOUNTER — Ambulatory Visit: Payer: BC Managed Care – PPO | Admitting: Physical Medicine and Rehabilitation

## 2021-05-04 DIAGNOSIS — E1142 Type 2 diabetes mellitus with diabetic polyneuropathy: Secondary | ICD-10-CM

## 2021-05-04 MED ORDER — LOSARTAN POTASSIUM 25 MG PO TABS: 25.0000 mg | ORAL_TABLET | Freq: Every day | ORAL | 11 refills | Status: DC

## 2021-05-04 MED ORDER — DAPAGLIFLOZIN PROPANEDIOL 10 MG PO TABS: 10.0000 mg | ORAL_TABLET | Freq: Every day | ORAL | 1 refills | Status: DC

## 2021-05-04 MED ORDER — TAMSULOSIN HCL 0.4 MG PO CAPS: ORAL_CAPSULE | ORAL | 2 refills | Status: DC

## 2021-05-04 MED ORDER — LOVASTATIN 20 MG PO TABS: 20.0000 mg | ORAL_TABLET | Freq: Every day | ORAL | 1 refills | Status: DC

## 2021-05-04 MED ORDER — PREGABALIN 75 MG PO CAPS: 75.0000 mg | ORAL_CAPSULE | Freq: Three times a day (TID) | ORAL | 0 refills | Status: DC

## 2021-05-04 MED ORDER — METFORMIN HCL 1000 MG PO TABS: 1000.0000 mg | ORAL_TABLET | Freq: Two times a day (BID) | ORAL | 1 refills | Status: DC

## 2021-05-04 NOTE — Telephone Encounter (Signed)
Called pt to give lab results, pt verbalized understanding. Pt also stated that he needed a refill of Farxiga, losartan, lovastatin, metformin,lyrica, and flomax. Medications have been refilled until next OV.

## 2021-05-22 IMAGING — DX DG KNEE COMPLETE 4+V*L*
4 series · 4 of 4 positions shown · non-contrast
Comparison: None.

CLINICAL DATA: Left knee pain for several weeks

EXAM:
LEFT KNEE - COMPLETE 4+ VIEW

[knee ap]
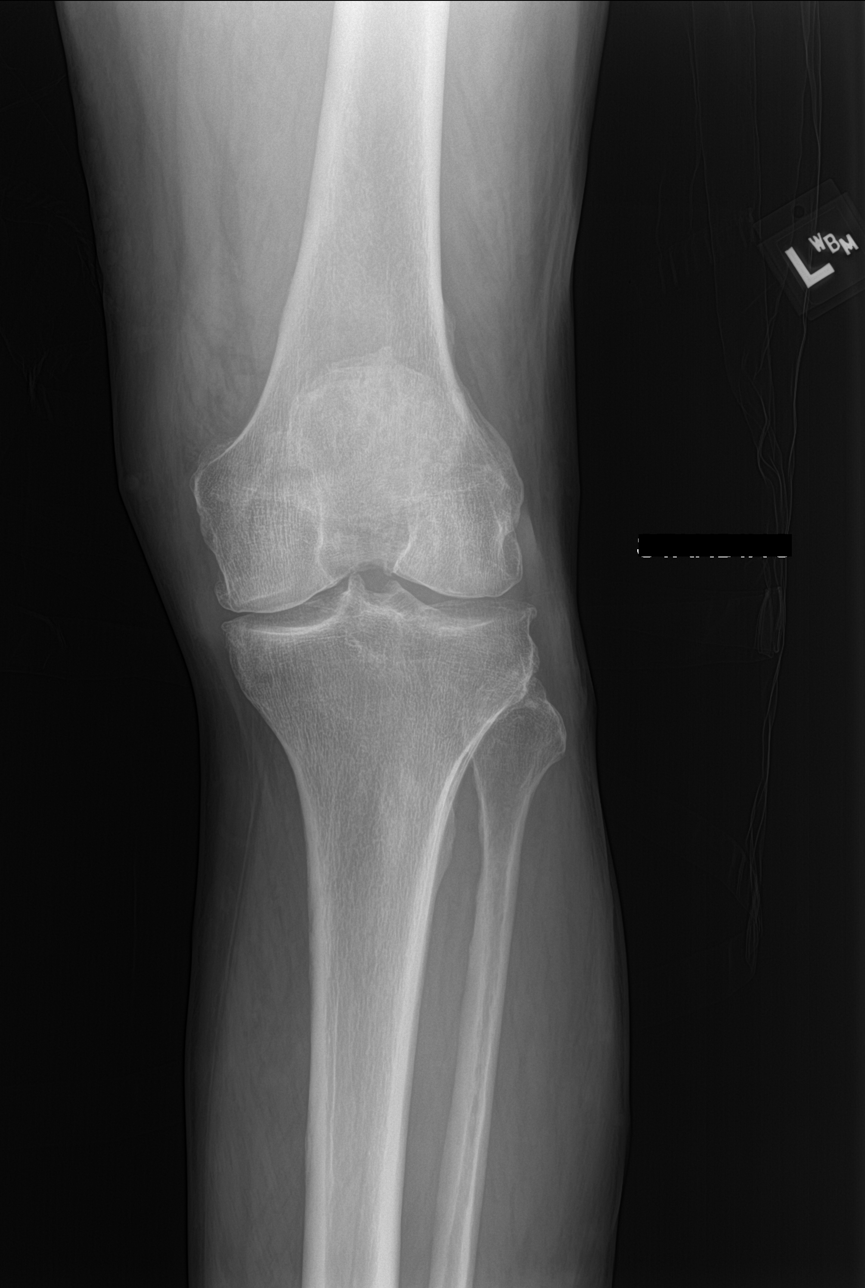

[knee tunnel]
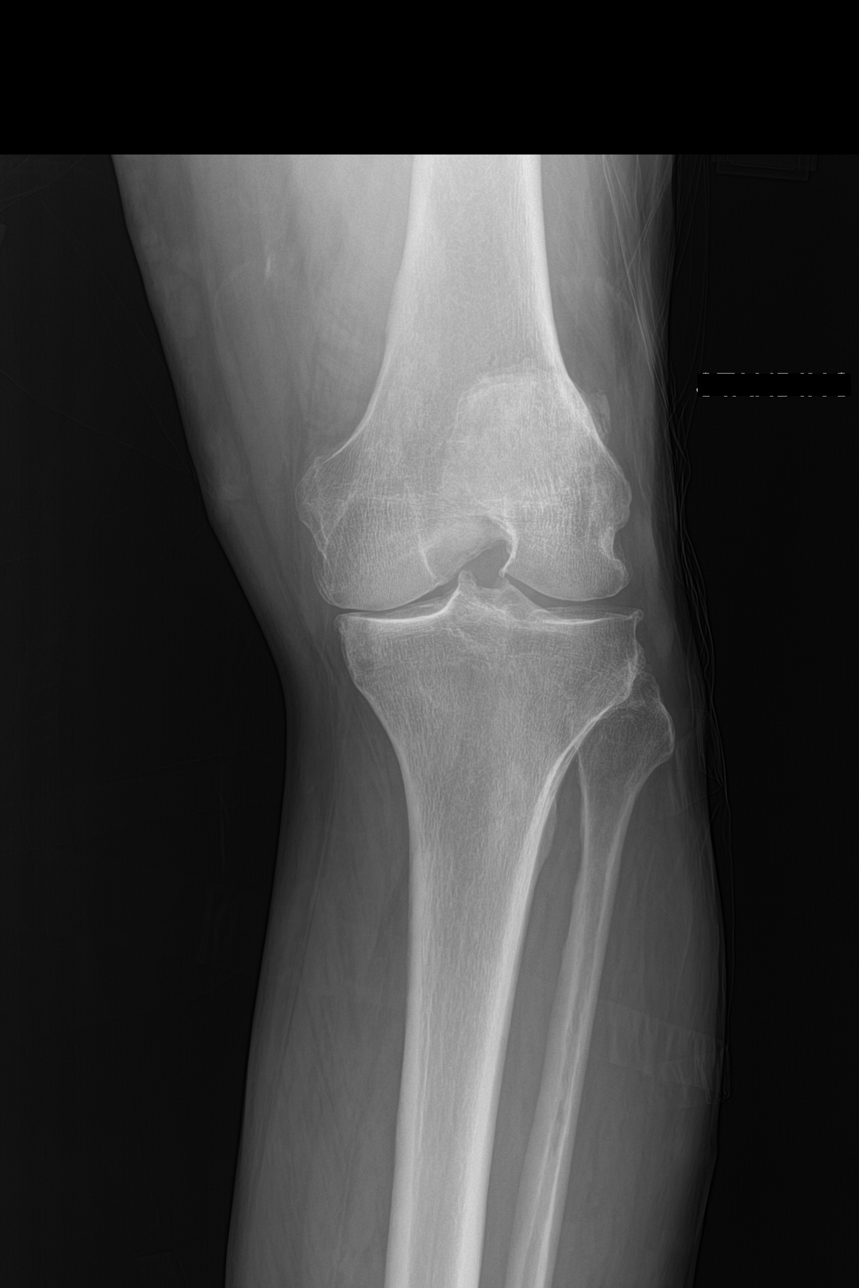

[knee lat]
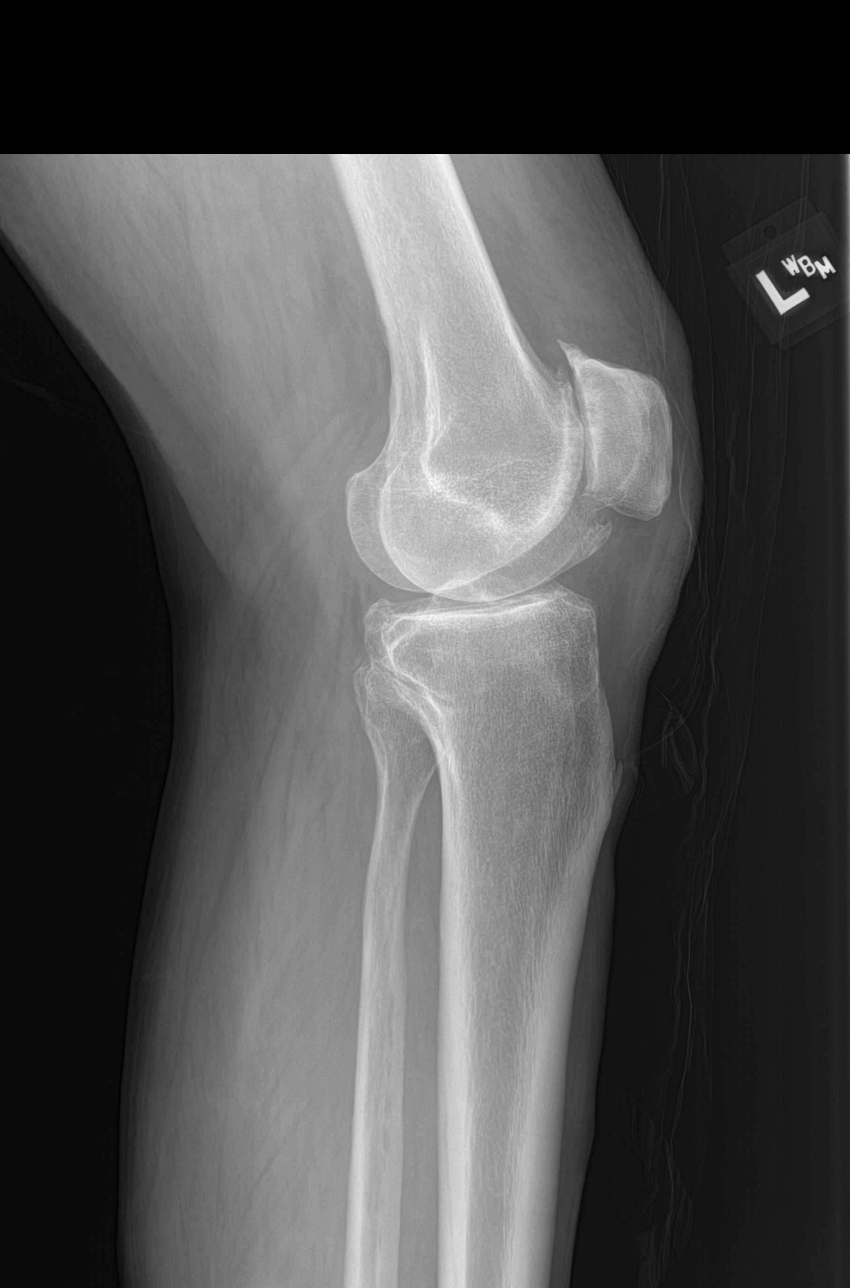

[patella skyline]
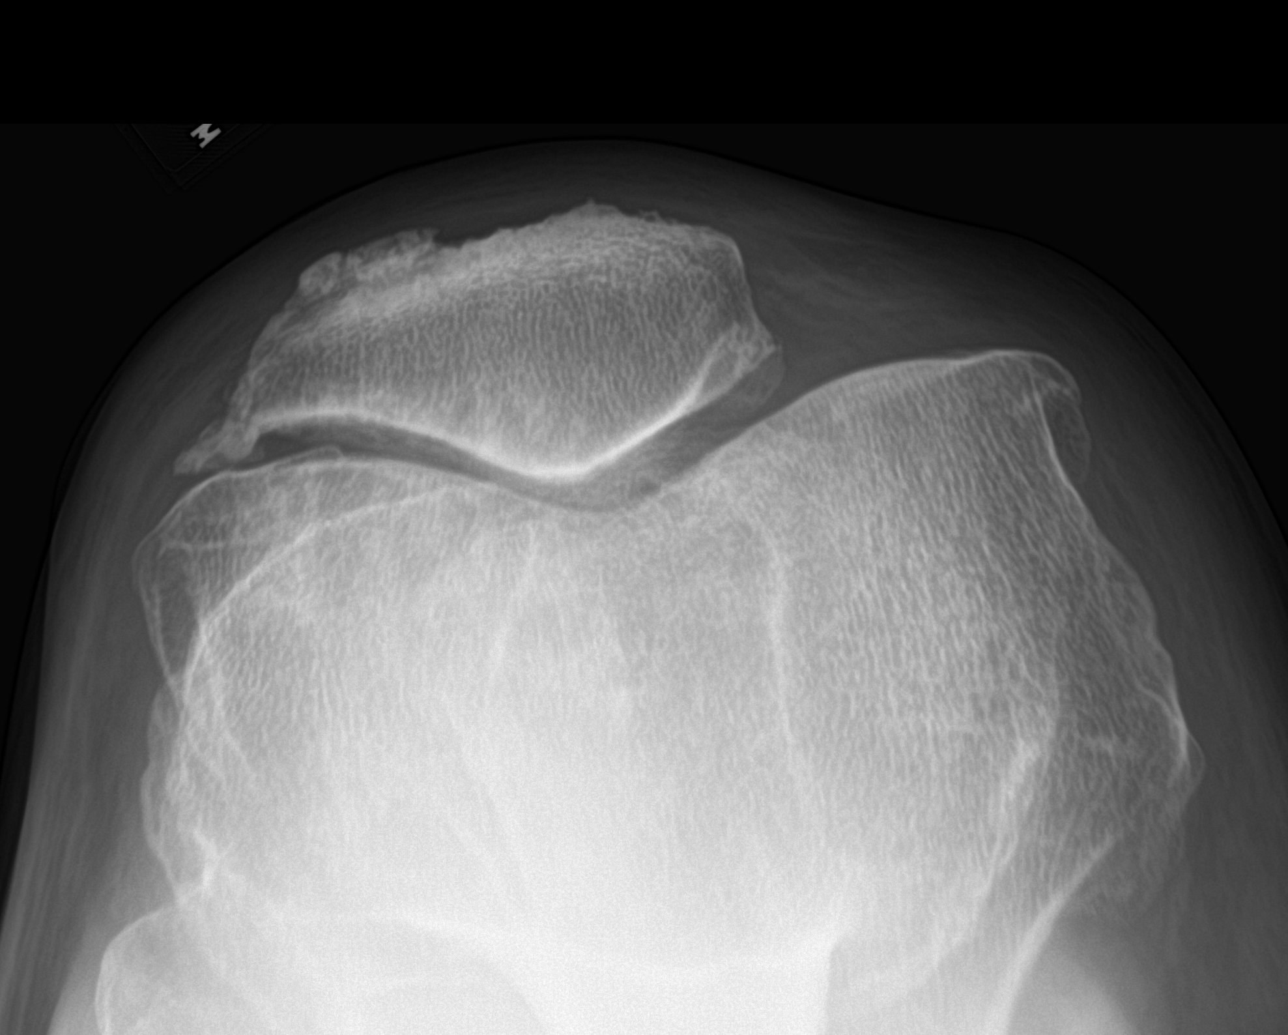

[4 of 4 positions shown; findings below may reference images not displayed]

FINDINGS: Degenerative changes of the knee joint are noted in all 3 joint
compartments. No joint effusion is seen. No acute fracture or
dislocation is noted.
IMPRESSION: Degenerative changes without acute abnormality.

## 2021-05-23 IMAGING — DX DG CHEST 2V
2 series · 2 of 2 positions shown · non-contrast
Comparison: January 11, 2020.

CLINICAL DATA: Dyspnea on exertion.

EXAM:
CHEST - 2 VIEW

[chest pa]
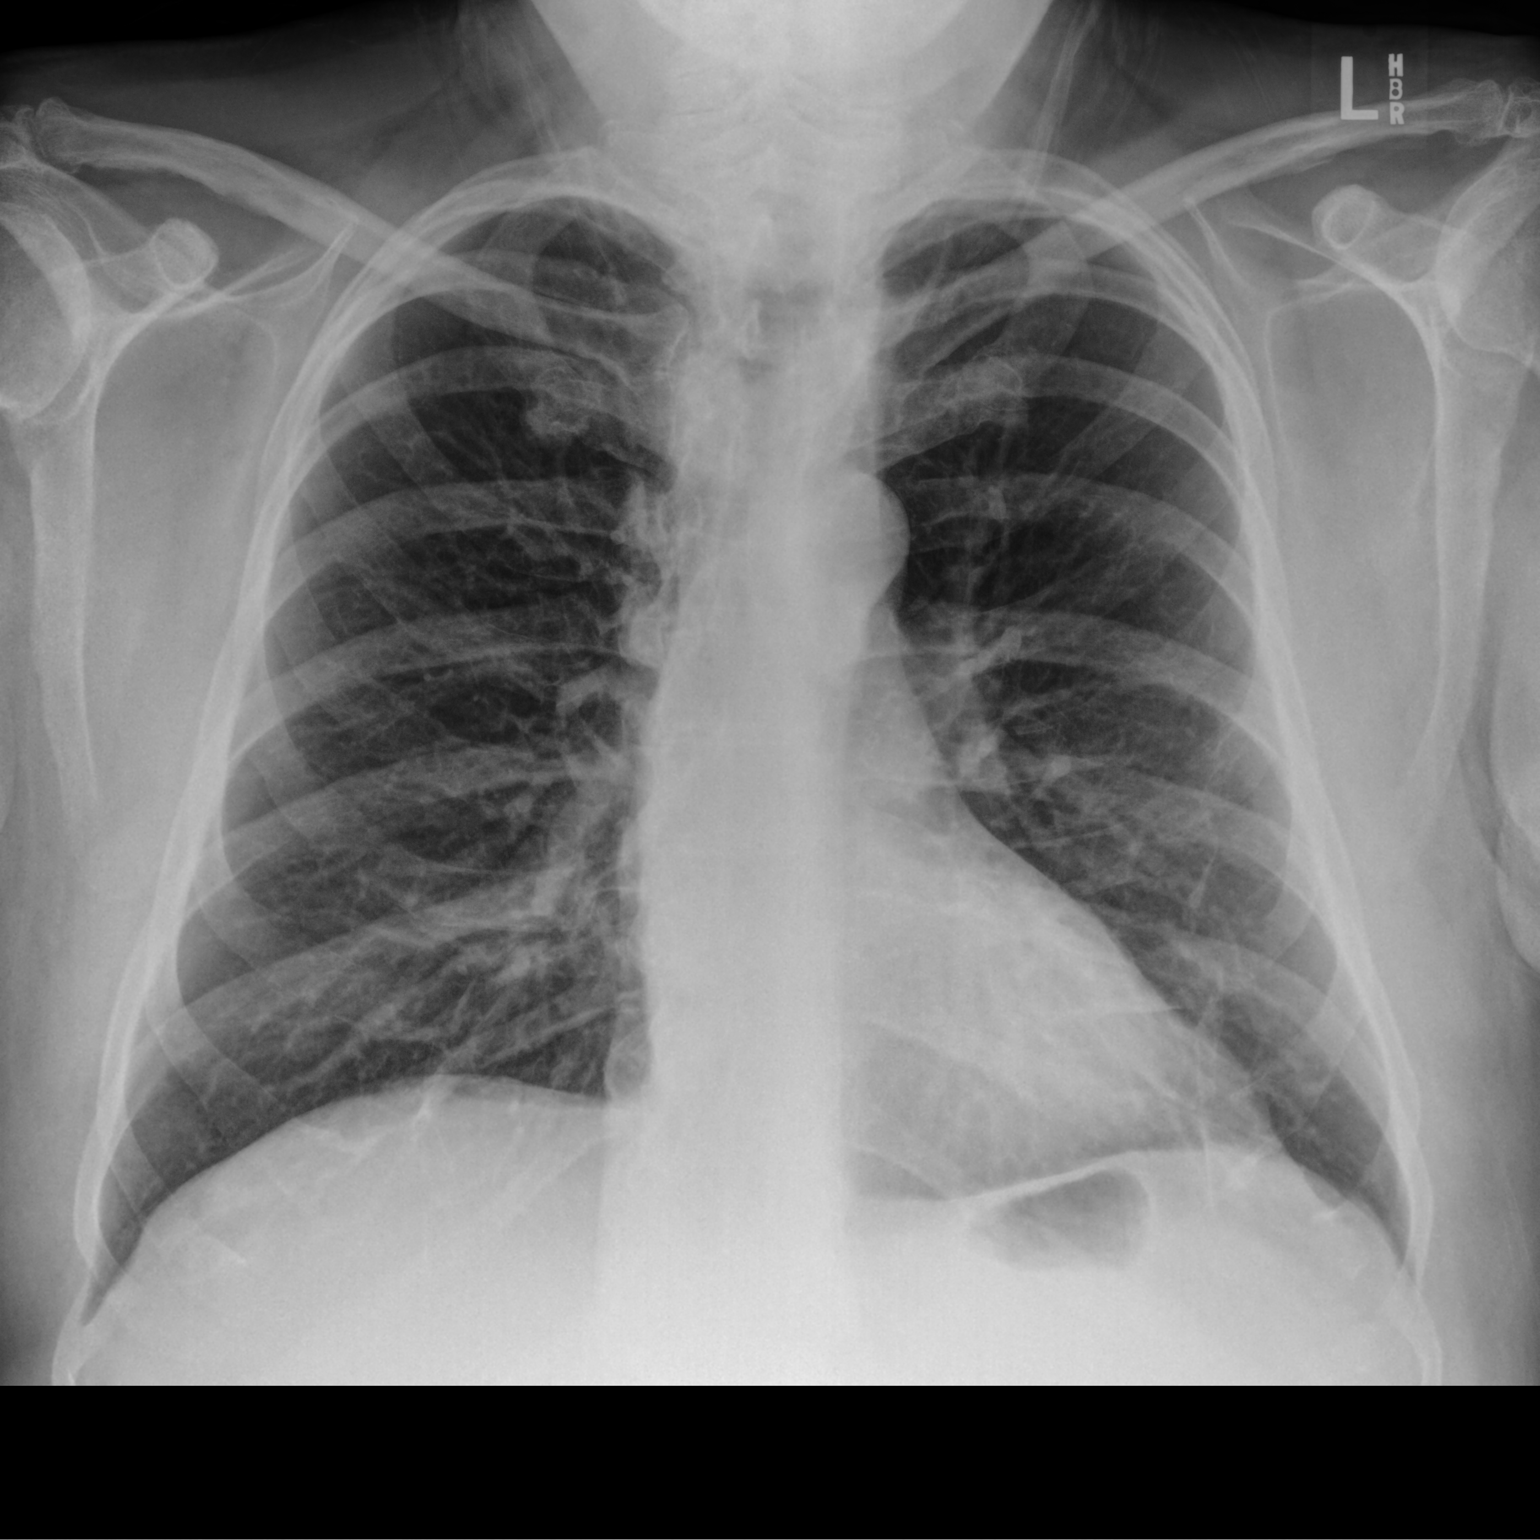

[chest lat]
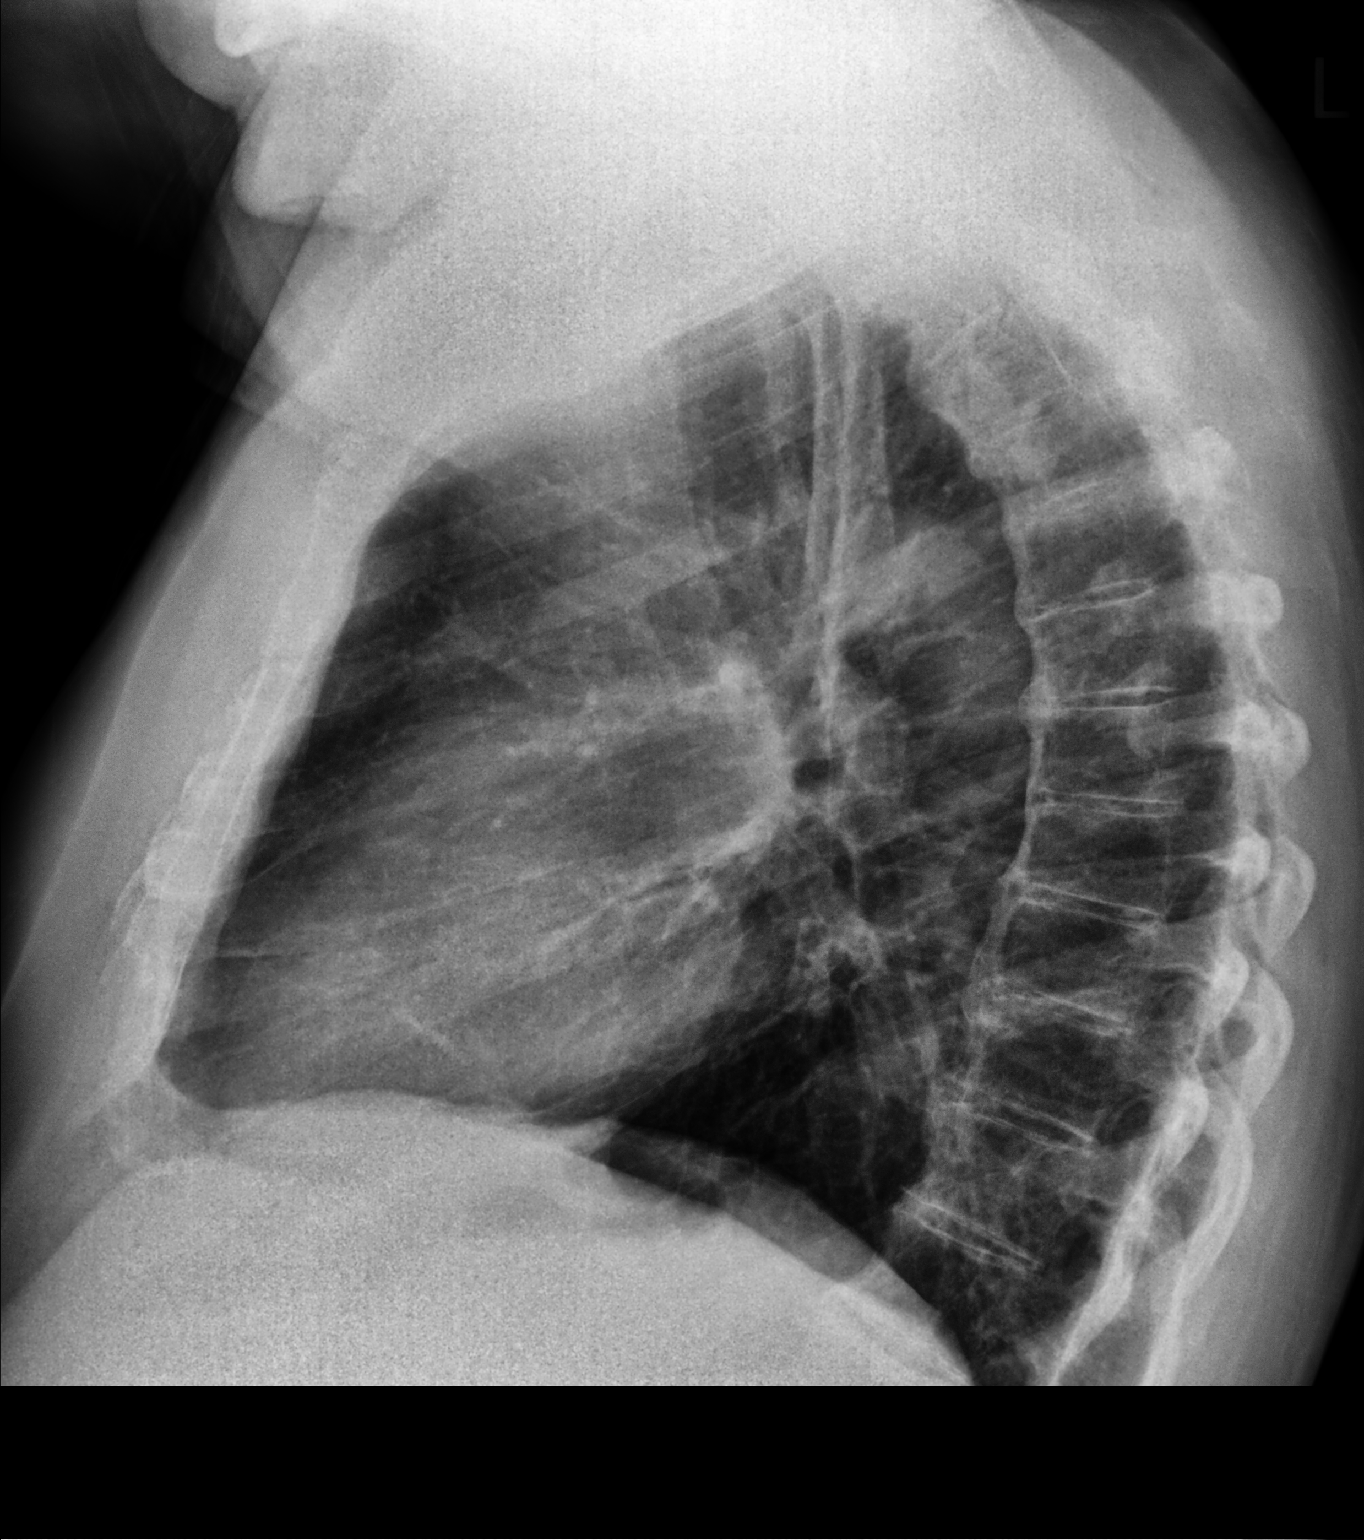

[2 of 2 positions shown; findings below may reference images not displayed]

FINDINGS: The heart size and mediastinal contours are within normal limits.
Both lungs are clear. No pneumothorax or pleural effusion is noted.
The visualized skeletal structures are unremarkable.
IMPRESSION: No active cardiopulmonary disease.

## 2021-06-04 ENCOUNTER — Other Ambulatory Visit: Payer: Self-pay | Admitting: Physical Medicine and Rehabilitation

## 2021-06-04 ENCOUNTER — Other Ambulatory Visit: Payer: Self-pay

## 2021-06-04 ENCOUNTER — Encounter: Payer: Self-pay | Admitting: Physical Medicine and Rehabilitation

## 2021-06-04 ENCOUNTER — Encounter
Payer: BC Managed Care – PPO | Attending: Physical Medicine and Rehabilitation | Admitting: Physical Medicine and Rehabilitation

## 2021-06-04 VITALS — BP 130/86 | HR 84 | Temp 84.0°F | Ht 72.0 in | Wt 269.8 lb

## 2021-06-04 DIAGNOSIS — R5383 Other fatigue: Secondary | ICD-10-CM | POA: Diagnosis not present

## 2021-06-04 DIAGNOSIS — I10 Essential (primary) hypertension: Secondary | ICD-10-CM | POA: Diagnosis not present

## 2021-06-04 DIAGNOSIS — E1142 Type 2 diabetes mellitus with diabetic polyneuropathy: Secondary | ICD-10-CM | POA: Diagnosis not present

## 2021-06-04 MED ORDER — LOSARTAN POTASSIUM 25 MG PO TABS: 25.0000 mg | ORAL_TABLET | Freq: Every day | ORAL | 11 refills | Status: DC

## 2021-06-04 MED ORDER — VITAMIN D (ERGOCALCIFEROL) 1.25 MG (50000 UNIT) PO CAPS: 50000.0000 [IU] | ORAL_CAPSULE | ORAL | 0 refills | Status: DC

## 2021-06-04 MED ORDER — N-ACETYL CYSTEINE 600 MG PO TABS: 1.0000 | ORAL_TABLET | Freq: Two times a day (BID) | ORAL | 3 refills | Status: DC

## 2021-06-04 NOTE — Progress Notes (Signed)
Subjective:    Patient ID: Casey Hunter., male    DOB: 17-Jul-1956, 65 y.o.   MRN: [id number]  HPI  Mr. Larkey returns for f/u of LONG COVID symptoms.   1) Long COVID symptoms -left leg still falls asleep -some nights it can be pretty bad -it is not usually painful -he is trying to work on his diet. -he is taking the Lyrica two at night before sleeping -he has taken some ivermectin-was taking it every day when he felt bad.   2) Diabetic peripheral neuropathy: -both feet feel cold -His CBGs have been harder to control since COVID -discussed his diet thoroughly.   3) BPH: -the tamsulosin has been helping him.  4) Fatigue -Contnues to have severe fatigue at times.  -yesterday was a bad tay -panning to return in August.   5) HTN: -BP is 130/86 -He has been taking Losartan 25mg   -he has been checking BP at home and it has been stable.    He continues to have numbness in his left calf. It is better than initially. His left hand will also sleep. It hurts but he is not sure if this is pain.  He has been having increase readings on his Freestyle Libre device- around 200s. For breakfast he eats eggs and sausage. For lunch he eats barebcue and sometimes french fries. Once in a while he eats sugar. He drinks water and unsweet tea and a fruit drink. He eats two bananas every day.   He has been checking pressures every day and they have been well controlled. He takes his BP medication 5 days per week.  Prior history: Dizziness is better, but still present at times. He has brought with him an excellent log of his AM and PM blood pressures, which are excellent off any blood pressure medications.   Obesity: He has made excellent changes to his diet. He has lost 2 lbs since last visit (currently 266 lbs). He has been eating one banana per day, eggs daily, salmon once per week, meat, and a lot of nuts. He feels he still needs to minimize sweets.   Energy: Has greatly improved but is  still not at the level required for his work. He has weaned off the Ritalin. Discussed goal of restarting work on October 11th for three days per week and he is agreeable.   Reviewed PCP note and he has been doing much better. HbA1c has improved to 7.7  Left leg numbness continues but is slightly improved.     Pain Inventory Average Pain 4 Pain Right Now 7 My pain is intermittent, constant, and tingling  In the last 24 hours, has pain interfered with the following? General activity 4 Relation with others 2 Enjoyment of life 5 What TIME of day is your pain at its worst? varies Sleep (in general) Fair  Pain is worse with: walking, bending, and some activites Pain improves with: medication Relief from Meds: 5  Family History  Problem Relation Age of Onset   Hypertension Mother    Hyperlipidemia Mother    Hyperlipidemia Father    Social History   Socioeconomic History   Marital status: Divorced    Spouse name: Not on file   Number of children: 2   Years of education: 12   Highest education level: High school graduate  Occupational History   Not on file  Tobacco Use   Smoking status: Never   Smokeless tobacco: Never  Vaping Use   Vaping Use:  Never used  Substance and Sexual Activity   Alcohol use: No    Alcohol/week: 0.0 standard drinks   Drug use: No   Sexual activity: Not Currently  Other Topics Concern   Not on file  Social History Narrative   Lives alone   Social Determinants of Health   Financial Resource Strain: Not on file  Food Insecurity: Not on file  Transportation Needs: Not on file  Physical Activity: Not on file  Stress: Not on file  Social Connections: Not on file   Past Surgical History:  Procedure Laterality Date   HERNIA REPAIR     Past Surgical History:  Procedure Laterality Date   HERNIA REPAIR     Past Medical History:  Diagnosis Date   Diabetic peripheral neuropathy associated with type 2 diabetes mellitus 03/14/2017   DOE  (dyspnea on exertion) 11/2019   Onset jan 2021 with covid 19  - assoc with atypical cp developed during the infection present mostly sitting/ absent supine/ better with deep breathing/ no worse with ex - 01/30/2020   Walked RA x two laps =  approx 578ft @ brisk pace - stopped due to end of study, no sob/ no change cp  with sats of 93 % at the end of the study and no acute ekg changes  - Echo  02/13/2020  wnl with  no pericadial ef   Fatigue 11/2019   History of COVID-19 11/2019   Hyperlipidemia associated with type 2 diabetes mellitus 09/16/2015   Hypertension associated with type 2 diabetes mellitus 09/16/2015   Knee osteoarthritis 04/14/2016   Mild neurocognitive disorder 06/12/2020   Numbness of lower extremity 11/2019   Attributed to COVID-19   Sleep apnea    Patient denied being diagnosed with OSA in the past   BP 130/86 (BP Location: Right Arm, Patient Position: Sitting)   Pulse 84   Temp (!) 84 F (28.9 C) (Oral)   Ht 6' (1.829 m)   Wt 269 lb 12.8 oz (122.4 kg)   SpO2 94%   BMI 36.59 kg/m   Opioid Risk Score:   Fall Risk Score:  `1  Depression screen PHQ 2/9  Depression screen [facility] 2/9 11/17/2020 08/08/2020 05/02/2020 04/24/2020 12/06/2019 04/24/2018 01/03/2018  Decreased Interest 0 0 2 2 0 0 0  Down, Depressed, Hopeless 0 0 0 0 0 0 0  PHQ - 2 Score 0 0 2 2 0 0 0  Altered sleeping - - 0 - - - -  Tired, decreased energy - - 2 - - - -  Change in appetite - - 0 - - - -  Feeling bad or failure about yourself  - - 0 - - - -  Trouble concentrating - - 0 - - - -  Moving slowly or fidgety/restless - - 1 - - - -  Suicidal thoughts - - 0 - - - -  PHQ-9 Score - - 5 - - - -  Difficult doing work/chores - - Somewhat difficult - - - -   Review of Systems  Constitutional: Negative.   HENT: Negative.    Eyes: Negative.   Respiratory: Negative.    Cardiovascular: Negative.   Gastrointestinal: Negative.   Endocrine: Negative.   Genitourinary: Negative.   Musculoskeletal:  Positive for gait  problem.       Left shoulder pain , right and left knee pain , left leg pain  Skin: Negative.   Allergic/Immunologic: Negative.   Hematological: Negative.   Psychiatric/Behavioral: Negative.  Objective:   Physical Exam Gen: no distress, normal appearing HEENT: oral mucosa pink and moist, NCAT Cardio: Reg rate Chest: normal effort, normal rate of breathing Abd: soft, non-distended Ext: no edema Psych: pleasant, normal affect Neuro: Alert and oriented x3. Normal gait. No decreased sensation.     Assessment & Plan:  Mr. Arnold is a 65 year old man who presents for follow-up on LONG COVID symptoms.   1) HTN: BP has been well controlled at home and is well controlled in office today. Continue Losartan 25mg    2) Fatigue: Improved but still present. Prescribed NAC 600mg  BID and gave list of foods that increase NAC. Off Ritalin. Increase work to 4 days per week until re-eval on April 1st. Provided with note. This is one of the reason he feels he needs to retire.   3) Low vitamin D: Repeat level today.   4) Leg numbness: Persists, but improved. Explained etiologies of nerve injury 2/2 COVID-19 vs. Diabetic neuropathy.   5) Obesity: Weight 268- lost 3 lbs since last visit! He eats salmon once per week, eggs daily, nuts, meat, olive oil. Made goal to avoid sugar. Discussed benefits of intermittent fasting.  6) DM 2: HgbA1c reviewed. 7.7- improved from before. PCP note reviewed- overall improved. Last checked about a month ago.  -Discussed Qutenza as an option for neuropathic pain control. Discussed that this is a capsaicin patch, stronger than capsaicin cream. Discussed that it is currently approved for diabetic peripheral neuropathy and post-herpetic neuralgia, but that it has also shown benefit in treating other forms of neuropathy. Provided patient with link to site to learn more about the patch: . Discussed that the patch would be placed in office and benefits  usually last 3 months. Discussed that unintended exposure to capsaicin can cause severe irritation of eyes, mucous membranes, respiratory tract, and skin, but that Qutenza is a local treatment and does not have the systemic side effects of other nerve medications. Discussed that there may be pain, itching, erythema, and decreased sensory function associated with the application of Qutenza. Side effects usually subside within 1 week. A cold pack of analgesic medications can help with these side effects. Blood pressure can also be increased due to pain associated with administration of the patch.   7) HLD -really improved on last labs! -continue Rapatha  General health: He has received Shingles and pneumococcal vaccines, early flu shot, and 2nd COVID booster.   All questions answered. RTC in 3 months.   40 minutes spent in discussion of HTN, DM2, fatigue, ivermectin, Qutenza, diabetic peripheral neuropathy

## 2021-06-04 NOTE — Telephone Encounter (Signed)
Refill request for Pregabalin. Patient seen today.

## 2021-06-04 NOTE — Patient Instructions (Addendum)
N-acetyl-cysteine  HTN: -Advised checking BP daily at home and logging results to bring into follow-up appointment with her PCP and myself. -Reviewed BP meds today.  -Advised regarding healthy foods that can help lower blood pressure and provided with a list: 1) citrus foods- high in vitamins and minerals 2) salmon and other fatty fish - reduces inflammation and oxylipins 3) swiss chard (leafy green)- high level of nitrates 4) pumpkin seeds- one of the best natural sources of magnesium 5) Beans and lentils- high in fiber, magnesium, and potassium 6) Berries- high in flavonoids 7) Amaranth (whole grain, can be cooked similarly to rice and oats)- high in magnesium and fiber 8) Pistachios- even more effective at reducing BP than other nuts 9) Carrots- high in phenolic compounds that relax blood vessels and reduce inflammation 10) Celery- contain phthalides that relax tissues of arterial walls 11) Tomatoes- can also improve cholesterol and reduce risk of heart disease 12) Broccoli- good source of magnesium, calcium, and potassium 13) Greek yogurt: high in potassium and calcium 14) Herbs and spices: Celery seed, cilantro, saffron, lemongrass, black cumin, ginseng, cinnamon, cardamom, sweet basil, and ginger 15) Chia and flax seeds- also help to lower cholesterol and blood sugar 16) Beets- high levels of nitrates that relax blood vessels  17) spinach and bananas- high in potassium

## 2021-06-05 MED ORDER — PREGABALIN 75 MG PO CAPS: 75.0000 mg | ORAL_CAPSULE | Freq: Three times a day (TID) | ORAL | 3 refills | Status: DC

## 2021-06-05 NOTE — Addendum Note (Signed)
Addended by: Horton Chin on: 06/05/2021 06:50 PM   Modules accepted: Orders

## 2021-07-01 ENCOUNTER — Encounter
Payer: BC Managed Care – PPO | Attending: Physical Medicine and Rehabilitation | Admitting: Physical Medicine and Rehabilitation

## 2021-07-01 ENCOUNTER — Other Ambulatory Visit: Payer: Self-pay | Admitting: Physical Medicine and Rehabilitation

## 2021-07-01 ENCOUNTER — Encounter: Payer: Self-pay | Admitting: Physical Medicine and Rehabilitation

## 2021-07-01 ENCOUNTER — Other Ambulatory Visit: Payer: Self-pay

## 2021-07-01 VITALS — BP 149/98 | HR 90 | Temp 98.0°F | Ht 73.0 in | Wt 270.6 lb

## 2021-07-01 DIAGNOSIS — E1142 Type 2 diabetes mellitus with diabetic polyneuropathy: Secondary | ICD-10-CM | POA: Insufficient documentation

## 2021-07-01 DIAGNOSIS — I10 Essential (primary) hypertension: Secondary | ICD-10-CM | POA: Insufficient documentation

## 2021-07-01 NOTE — Progress Notes (Signed)
Subjective:    Patient ID: Casey Hunter., male    DOB: 1956-01-03, 65 y.o.   MRN: [id number]  HPI  Casey Hunter returns for f/u of LONG COVID symptoms.   1) Long COVID symptoms -left leg still falls asleep- ready to try Qutenza today -some nights it can be pretty bad -it is not usually painful -he is trying to work on his diet. -he is taking the Lyrica two at night before sleeping -he has taken some ivermectin-was taking it every day when he felt bad.   2) Diabetic peripheral neuropathy: -both feet feel cold -His CBGs have been harder to control since COVID -discussed his diet thoroughly.  -ready to try Qutenza today -pain is mostly present on soles of both feet- sometimes in his toes  3) BPH: -the tamsulosin has been helping him.  4) Fatigue -Contnues to have severe fatigue at times.  -yesterday was a bad tay -panning to return in August.   5) HTN: -BP is 130/86 -He has been taking Losartan 25mg   -he has been checking BP at home and it has been stable.    He continues to have numbness in his left calf. It is better than initially. His left hand will also sleep. It hurts but he is not sure if this is pain.  He has been having increase readings on his Freestyle Libre device- around 200s. For breakfast he eats eggs and sausage. For lunch he eats barebcue and sometimes french fries. Once in a while he eats sugar. He drinks water and unsweet tea and a fruit drink. He eats two bananas every day.   He has been checking pressures every day and they have been well controlled. He takes his BP medication 5 days per week.  Prior history: Dizziness is better, but still present at times. He has brought with him an excellent log of his AM and PM blood pressures, which are excellent off any blood pressure medications.   Obesity: He has made excellent changes to his diet. He has lost 2 lbs since last visit (currently 266 lbs). He has been eating one banana per day, eggs daily,  salmon once per week, meat, and a lot of nuts. He feels he still needs to minimize sweets.   Energy: Has greatly improved but is still not at the level required for his work. He has weaned off the Ritalin. Discussed goal of restarting work on October 11th for three days per week and he is agreeable.   Reviewed PCP note and he has been doing much better. HbA1c has improved to 7.7  Left leg numbness continues but is slightly improved.     Pain Inventory Average Pain 4 Pain Right Now 7 My pain is intermittent, constant, and tingling  In the last 24 hours, has pain interfered with the following? General activity 4 Relation with others 2 Enjoyment of life 5 What TIME of day is your pain at its worst? varies Sleep (in general) Fair  Pain is worse with: walking, bending, and some activites Pain improves with: medication Relief from Meds: 5  Family History  Problem Relation Age of Onset   Hypertension Mother    Hyperlipidemia Mother    Hyperlipidemia Father    Social History   Socioeconomic History   Marital status: Divorced    Spouse name: Not on file   Number of children: 2   Years of education: 12   Highest education level: High school graduate  Occupational History  Not on file  Tobacco Use   Smoking status: Never   Smokeless tobacco: Never  Vaping Use   Vaping Use: Never used  Substance and Sexual Activity   Alcohol use: No    Alcohol/week: 0.0 standard drinks   Drug use: No   Sexual activity: Not Currently  Other Topics Concern   Not on file  Social History Narrative   Lives alone   Social Determinants of Health   Financial Resource Strain: Not on file  Food Insecurity: Not on file  Transportation Needs: Not on file  Physical Activity: Not on file  Stress: Not on file  Social Connections: Not on file   Past Surgical History:  Procedure Laterality Date   HERNIA REPAIR     Past Surgical History:  Procedure Laterality Date   HERNIA REPAIR     Past  Medical History:  Diagnosis Date   Diabetic peripheral neuropathy associated with type 2 diabetes mellitus 03/14/2017   DOE (dyspnea on exertion) 11/2019   Onset jan 2021 with covid 19  - assoc with atypical cp developed during the infection present mostly sitting/ absent supine/ better with deep breathing/ no worse with ex - 01/30/2020   Walked RA x two laps =  approx 53700ft @ brisk pace - stopped due to end of study, no sob/ no change cp  with sats of 93 % at the end of the study and no acute ekg changes  - Echo  02/13/2020  wnl with  no pericadial ef   Fatigue 11/2019   History of COVID-19 11/2019   Hyperlipidemia associated with type 2 diabetes mellitus 09/16/2015   Hypertension associated with type 2 diabetes mellitus 09/16/2015   Knee osteoarthritis 04/14/2016   Mild neurocognitive disorder 06/12/2020   Numbness of lower extremity 11/2019   Attributed to COVID-19   Sleep apnea    Patient denied being diagnosed with OSA in the past   BP (!) 149/98   Pulse 90   Temp 98 F (36.7 C)   Ht 6\' 1"  (1.854 m)   Wt 270 lb 9.6 oz (122.7 kg)   SpO2 94%   BMI 35.70 kg/m   Opioid Risk Score:   Fall Risk Score:  `1  Depression screen PHQ 2/9  [facility] 06/04/2021 11/17/2020 08/08/2020 05/02/2020 04/24/2020 12/06/2019 04/24/2018  Decreased Interest 0 0 0 2 2 0 0  Down, Depressed, Hopeless 0 0 0 0 0 0 0  PHQ - 2 Score 0 0 0 2 2 0 0  Altered sleeping - - - 0 - - -  Tired, decreased energy - - - 2 - - -  Change in appetite - - - 0 - - -  Feeling bad or failure about yourself  - - - 0 - - -  Trouble concentrating - - - 0 - - -  Moving slowly or fidgety/restless - - - 1 - - -  Suicidal thoughts - - - 0 - - -  PHQ-9 Score - - - 5 - - -  Difficult doing work/chores - - - Somewhat difficult - - -   Review of Systems  Constitutional: Negative.   HENT: Negative.    Eyes: Negative.   Respiratory: Negative.    Cardiovascular: Negative.   Gastrointestinal: Negative.   Endocrine:  Negative.   Genitourinary: Negative.   Musculoskeletal:  Positive for gait problem.       Left shoulder pain , right and left knee pain , left leg pain  Skin:  Negative.   Allergic/Immunologic: Negative.   Hematological: Negative.   Psychiatric/Behavioral: Negative.        Objective:   Physical Exam Gen: no distress, normal appearing HEENT: oral mucosa pink and moist, NCAT Cardio: Reg rate Chest: normal effort, normal rate of breathing Abd: soft, non-distended Ext: no edema Psych: pleasant, normal affect Neuro: Alert and oriented x3. Normal gait. No decreased sensation.     Assessment & Plan:  Casey Hunter is a 64 year old man who presents for follow-up on LONG COVID symptoms.   1) HTN: BP elevated today. Continue Losartan 25mg   -Advised checking BP daily at home and logging results to bring into follow-up appointment with her PCP and myself. -Reviewed BP meds today.  -Advised regarding healthy foods that can help lower blood pressure and provided with a list: 1) citrus foods- high in vitamins and minerals 2) salmon and other fatty fish - reduces inflammation and oxylipins 3) swiss chard (leafy green)- high level of nitrates 4) pumpkin seeds- one of the best natural sources of magnesium 5) Beans and lentils- high in fiber, magnesium, and potassium 6) Berries- high in flavonoids 7) Amaranth (whole grain, can be cooked similarly to rice and oats)- high in magnesium and fiber 8) Pistachios- even more effective at reducing BP than other nuts 9) Carrots- high in phenolic compounds that relax blood vessels and reduce inflammation 10) Celery- contain phthalides that relax tissues of arterial walls 11) Tomatoes- can also improve cholesterol and reduce risk of heart disease 12) Broccoli- good source of magnesium, calcium, and potassium 13) Greek yogurt: high in potassium and calcium 14) Herbs and spices: Celery seed, cilantro, saffron, lemongrass, black cumin, ginseng, cinnamon,  cardamom, sweet basil, and ginger 15) Chia and flax seeds- also help to lower cholesterol and blood sugar 16) Beets- high levels of nitrates that relax blood vessels  17) spinach and bananas- high in potassium  -Provided lise of supplements that can help with hypertension:  1) magnesium: one high quality brand is Bioptemizers since it contains all 7 types of magnesium, otherwise over the counter magnesium gluconate 400mg  is a good option 2) B vitamins 3) vitamin D 4) potassium 5) CoQ10 6) L-arginine 7) Vitamin C 8) Beetroot -Educated that goal BP is 120/80. -Made goal to incorporate some of the above foods into diet.    2) Fatigue: Improved but still present. Prescribed NAC 600mg  BID and gave list of foods that increase NAC. Off Ritalin. Increase work to 4 days per week until re-eval on April 1st. Provided with note. This is one of the reason he feels he needs to retire.   3) Low vitamin D: continue supplementtion  4) Leg numbness: Persists, but improved. Explained etiologies of nerve injury 2/2 COVID-19 vs. Diabetic neuropathy.   5) Obesity: Weight 268- lost 3 lbs since last visit! He eats salmon once per week, eggs daily, nuts, meat, olive oil. Made goal to avoid sugar. Discussed benefits of intermittent fasting.  6) DM 2: HgbA1c reviewed. 7.7- improved from before. PCP note reviewed- overall improved. Last checked about a month ago.   7) HLD -really improved on last labs! -continue Rapatha  8) Diabetic peripheral neuropathy -Discussed Qutenza as an option for neuropathic pain control. Discussed that this is a capsaicin patch, stronger than capsaicin cream. Discussed that it is currently approved for diabetic peripheral neuropathy and post-herpetic neuralgia, but that it has also shown benefit in treating other forms of neuropathy. Provided patient with link to site to learn more about the  patch: https://www.clark.biz/. Discussed that the patch would be placed in office and  benefits usually last 3 months. Discussed that unintended exposure to capsaicin can cause severe irritation of eyes, mucous membranes, respiratory tract, and skin, but that Qutenza is a local treatment and does not have the systemic side effects of other nerve medications. Discussed that there may be pain, itching, erythema, and decreased sensory function associated with the application of Qutenza. Side effects usually subside within 1 week. A cold pack of analgesic medications can help with these side effects. Blood pressure can also be increased due to pain associated with administration of the patch.  4 patches of Qutenza was applied to the area of pain. Ice packs were applied during the procedure to ensure patient comfort. Blood pressure was monitored every 15 minutes. The patient tolerated the procedure well. Post-procedure instructions were given and follow-up has been scheduled.  [facility] [phone]  General health: He has received Shingles and pneumococcal vaccines, early flu shot, and 2nd COVID booster.   All questions answered. RTC in 3 months.

## 2021-07-01 NOTE — Patient Instructions (Signed)
HTN: -BP is ___today.  -Advised checking BP daily at home and logging results to bring into follow-up appointment with her PCP and myself. -Reviewed BP meds today.  -Advised regarding healthy foods that can help lower blood pressure and provided with a list: 1) citrus foods- high in vitamins and minerals 2) salmon and other fatty fish - reduces inflammation and oxylipins 3) swiss chard (leafy green)- high level of nitrates 4) pumpkin seeds- one of the best natural sources of magnesium 5) Beans and lentils- high in fiber, magnesium, and potassium 6) Berries- high in flavonoids 7) Amaranth (whole grain, can be cooked similarly to rice and oats)- high in magnesium and fiber 8) Pistachios- even more effective at reducing BP than other nuts 9) Carrots- high in phenolic compounds that relax blood vessels and reduce inflammation 10) Celery- contain phthalides that relax tissues of arterial walls 11) Tomatoes- can also improve cholesterol and reduce risk of heart disease 12) Broccoli- good source of magnesium, calcium, and potassium 13) Greek yogurt: high in potassium and calcium 14) Herbs and spices: Celery seed, cilantro, saffron, lemongrass, black cumin, ginseng, cinnamon, cardamom, sweet basil, and ginger 15) Chia and flax seeds- also help to lower cholesterol and blood sugar 16) Beets- high levels of nitrates that relax blood vessels  17) spinach and bananas- high in potassium  -Provided lise of supplements that can help with hypertension:  1) magnesium: one high quality brand is Bioptemizers since it contains all 7 types of magnesium, otherwise over the counter magnesium gluconate 400mg is a good option 2) B vitamins 3) vitamin D 4) potassium 5) CoQ10 6) L-arginine 7) Vitamin C 8) Beetroot -Educated that goal BP is 120/80. -Made goal to incorporate some of the above foods into diet.   

## 2021-07-02 ENCOUNTER — Other Ambulatory Visit: Payer: Self-pay

## 2021-07-02 NOTE — Telephone Encounter (Signed)
ERROR

## 2021-07-03 ENCOUNTER — Telehealth: Payer: Self-pay | Admitting: Internal Medicine

## 2021-07-03 NOTE — Telephone Encounter (Signed)
Ok to refill his freestyle libre? Please advise

## 2021-07-03 NOTE — Telephone Encounter (Signed)
Okay to refill but not sure if he qualifies for this if it needs PA since he is not on insulin injections.

## 2021-07-06 MED ORDER — FREESTYLE LIBRE 14 DAY SENSOR MISC: 10 refills | Status: DC

## 2021-07-06 NOTE — Telephone Encounter (Signed)
Refill has been sent to the pharmacy.  

## 2021-07-08 ENCOUNTER — Other Ambulatory Visit: Payer: Self-pay | Admitting: Physical Medicine and Rehabilitation

## 2021-07-08 MED ORDER — FREESTYLE LIBRE 2 SENSOR MISC: 1.0000 [IU] | Freq: Every day | 30 refills | Status: DC

## 2021-07-08 MED ORDER — FREESTYLE LIBRE 2 READER DEVI: 1.0000 [IU] | Freq: Every day | 30 refills | Status: DC

## 2021-07-31 ENCOUNTER — Other Ambulatory Visit: Payer: Self-pay | Admitting: Physical Medicine and Rehabilitation

## 2021-08-25 ENCOUNTER — Other Ambulatory Visit: Payer: Self-pay

## 2021-08-25 ENCOUNTER — Encounter: Payer: Self-pay | Admitting: Internal Medicine

## 2021-08-25 ENCOUNTER — Ambulatory Visit (INDEPENDENT_AMBULATORY_CARE_PROVIDER_SITE_OTHER): Payer: BC Managed Care – PPO | Admitting: Internal Medicine

## 2021-08-25 VITALS — BP 124/78 | HR 54 | Temp 97.9°F | Resp 18 | Ht 73.0 in | Wt 265.6 lb

## 2021-08-25 DIAGNOSIS — M170 Bilateral primary osteoarthritis of knee: Secondary | ICD-10-CM | POA: Diagnosis not present

## 2021-08-25 DIAGNOSIS — E1142 Type 2 diabetes mellitus with diabetic polyneuropathy: Secondary | ICD-10-CM

## 2021-08-25 DIAGNOSIS — E118 Type 2 diabetes mellitus with unspecified complications: Secondary | ICD-10-CM | POA: Diagnosis not present

## 2021-08-25 LAB — POCT GLYCOSYLATED HEMOGLOBIN (HGB A1C): Hemoglobin A1C: 7.9 % — AB (ref 4.0–5.6)

## 2021-08-25 MED ORDER — MELOXICAM 15 MG PO TABS: 15.0000 mg | ORAL_TABLET | Freq: Every day | ORAL | 1 refills | Status: DC | PRN

## 2021-08-25 MED ORDER — LOSARTAN POTASSIUM 25 MG PO TABS: 25.0000 mg | ORAL_TABLET | Freq: Every day | ORAL | 3 refills | Status: DC

## 2021-08-25 MED ORDER — LOVASTATIN 20 MG PO TABS: 20.0000 mg | ORAL_TABLET | Freq: Every day | ORAL | 3 refills | Status: DC

## 2021-08-25 MED ORDER — METFORMIN HCL 1000 MG PO TABS: 1000.0000 mg | ORAL_TABLET | Freq: Two times a day (BID) | ORAL | 3 refills | Status: DC

## 2021-08-25 MED ORDER — DAPAGLIFLOZIN PROPANEDIOL 10 MG PO TABS: 10.0000 mg | ORAL_TABLET | Freq: Every day | ORAL | 3 refills | Status: DC

## 2021-08-25 MED ORDER — TAMSULOSIN HCL 0.4 MG PO CAPS: ORAL_CAPSULE | ORAL | 3 refills | Status: DC

## 2021-08-25 NOTE — Patient Instructions (Signed)
Your HgA1c is 7.9 today which is better.

## 2021-08-25 NOTE — Progress Notes (Signed)
   Subjective:   Patient ID: Casey Hunter., male    DOB: [birth year], 65 y.o.   MRN: [id number]  HPI The patient is a 65 YO man coming in for follow up.   Review of Systems  Constitutional: Negative.   HENT: Negative.    Eyes: Negative.   Respiratory:  Negative for cough, chest tightness and shortness of breath.   Cardiovascular:  Negative for chest pain, palpitations and leg swelling.  Gastrointestinal:  Negative for abdominal distention, abdominal pain, constipation, diarrhea, nausea and vomiting.  Musculoskeletal: Negative.   Skin: Negative.   Neurological: Negative.   Psychiatric/Behavioral: Negative.     Objective:  Physical Exam Constitutional:      Appearance: He is well-developed. He is obese.  HENT:     Head: Normocephalic and atraumatic.  Cardiovascular:     Rate and Rhythm: Normal rate and regular rhythm.  Pulmonary:     Effort: Pulmonary effort is normal. No respiratory distress.     Breath sounds: Normal breath sounds. No wheezing or rales.  Abdominal:     General: Bowel sounds are normal. There is no distension.     Palpations: Abdomen is soft.     Tenderness: There is no abdominal tenderness. There is no rebound.  Musculoskeletal:     Cervical back: Normal range of motion.  Skin:    General: Skin is warm and dry.  Neurological:     Mental Status: He is alert and oriented to person, place, and time.     Coordination: Coordination normal.    Vitals:   08/25/21 0821  BP: 124/78  Pulse: (!) 54  Resp: 18  Temp: 97.9 F (36.6 C)  TempSrc: Oral  SpO2: 99%  Weight: 265 lb 9.6 oz (120.5 kg)  Height: 6\' 1"  (1.854 m)    This visit occurred during the SARS-CoV-2 public health emergency.  Safety protocols were in place, including screening questions prior to the visit, additional usage of staff PPE, and extensive cleaning of exam room while observing appropriate contact time as indicated for disinfecting solutions.   Assessment & Plan:

## 2021-08-28 NOTE — Assessment & Plan Note (Signed)
POC HgA1c done at visit and treatment is appropriate. Continue farxiga 10 mg daily and metformin 1000 mg BID and trulicity. He is on ARB and statin.

## 2021-09-18 ENCOUNTER — Ambulatory Visit: Payer: BC Managed Care – PPO | Admitting: Physical Medicine and Rehabilitation

## 2021-09-30 ENCOUNTER — Other Ambulatory Visit: Payer: Self-pay | Admitting: Physical Medicine and Rehabilitation

## 2021-10-02 ENCOUNTER — Other Ambulatory Visit: Payer: Self-pay

## 2021-10-02 ENCOUNTER — Telehealth: Payer: Self-pay

## 2021-10-02 ENCOUNTER — Encounter
Payer: Medicare Other | Attending: Physical Medicine and Rehabilitation | Admitting: Physical Medicine and Rehabilitation

## 2021-10-02 VITALS — BP 131/79 | HR 72 | Ht 73.0 in | Wt 265.4 lb

## 2021-10-02 DIAGNOSIS — R252 Cramp and spasm: Secondary | ICD-10-CM | POA: Diagnosis not present

## 2021-10-02 DIAGNOSIS — Z125 Encounter for screening for malignant neoplasm of prostate: Secondary | ICD-10-CM

## 2021-10-02 DIAGNOSIS — R351 Nocturia: Secondary | ICD-10-CM | POA: Diagnosis not present

## 2021-10-02 DIAGNOSIS — E349 Endocrine disorder, unspecified: Secondary | ICD-10-CM | POA: Diagnosis not present

## 2021-10-02 DIAGNOSIS — E559 Vitamin D deficiency, unspecified: Secondary | ICD-10-CM | POA: Diagnosis not present

## 2021-10-02 DIAGNOSIS — N401 Enlarged prostate with lower urinary tract symptoms: Secondary | ICD-10-CM | POA: Diagnosis not present

## 2021-10-02 DIAGNOSIS — R413 Other amnesia: Secondary | ICD-10-CM | POA: Insufficient documentation

## 2021-10-02 DIAGNOSIS — R972 Elevated prostate specific antigen [PSA]: Secondary | ICD-10-CM | POA: Diagnosis not present

## 2021-10-02 MED ORDER — TAMSULOSIN HCL 0.4 MG PO CAPS: 0.8000 mg | ORAL_CAPSULE | Freq: Every day | ORAL | 3 refills | Status: DC

## 2021-10-02 NOTE — Telephone Encounter (Signed)
The pharmacy sent a fax for clarification of the Flomax. They have 2 capsules at supper and 1 capsule at supper. Please advise.

## 2021-10-02 NOTE — Progress Notes (Signed)
Subjective:    Patient ID: Casey Hunter., male    DOB: 03-17-1956, 65 y.o.   MRN: [id number]  HPI  Casey Hunter returns for f/u of LONG COVID symptoms.   1) Long COVID symptoms -left leg still falls asleep- ready to try Qutenza today -some nights it can be pretty bad -it is not usually painful -he is trying to work on his diet. -he is taking the Lyrica two at night before sleeping -he has taken some ivermectin-was taking it every day when he felt bad.  -he asks about taking Pepcid and Claritin -does feel much better -feels like he is getting enough protein -he does not eat much citrus fruits due to his diabetes.  -he loves to eat strawberries and blackberries  2) Diabetic peripheral neuropathy: -both feet feel cold -His CBGs have been harder to control since COVID -discussed his diet thoroughly.  -got 60% improvement with Qutenza, ready to try again today -pain is mostly present on soles of both feet- sometimes in his toes  3) BPH: -the tamsulosin has been helping him.  4) Fatigue -Contnues to have severe fatigue at times.  -yesterday was a bad tay -panning to return in August.   5) HTN: -BP is 131/79 -He has been taking Losartan  - this is his only BP med- BP has been very well controlled.  -he has been checking BP at home and it has been stable.  -he likes cinnamon   He continues to have numbness in his left calf. It is better than initially. His left hand will also sleep. It hurts but he is not sure if this is pain.  He has been having increase readings on his Freestyle Libre device- around 200s. For breakfast he eats eggs and sausage. For lunch he eats barebcue and sometimes french fries. Once in a while he eats sugar. He drinks water and unsweet tea and a fruit drink. He eats two bananas every day.   He has been checking pressures every day and they have been well controlled. He takes his BP medication 5 days per week.  Prior history: Dizziness is  better, but still present at times. He has brought with him an excellent log of his AM and PM blood pressures, which are excellent off any blood pressure medications.   Obesity: He has made excellent changes to his diet. He has lost 2 lbs since last visit (currently 266 lbs). He has been eating one banana per day, eggs daily, salmon once per week, meat, and a lot of nuts. He feels he still needs to minimize sweets.   Energy: Has greatly improved but is still not at the level required for his work. He has weaned off the Ritalin. Discussed goal of restarting work on October 11th for three days per week and he is agreeable.   Reviewed PCP note and he has been doing much better. HbA1c has improved to 7.7  Left leg numbness continues but is slightly improved.     Pain Inventory Average Pain 4 Pain Right Now 7 My pain is intermittent, constant, and tingling  In the last 24 hours, has pain interfered with the following? General activity 4 Relation with others 2 Enjoyment of life 5 What TIME of day is your pain at its worst? varies Sleep (in general) Fair  Pain is worse with: walking, bending, and some activites Pain improves with: medication Relief from Meds: 5  Family History  Problem Relation Age of Onset   Hypertension  Mother    Hyperlipidemia Mother    Hyperlipidemia Father    Social History   Socioeconomic History   Marital status: Divorced    Spouse name: Not on file   Number of children: 2   Years of education: 12   Highest education level: High school graduate  Occupational History   Not on file  Tobacco Use   Smoking status: Never   Smokeless tobacco: Never  Vaping Use   Vaping Use: Never used  Substance and Sexual Activity   Alcohol use: No    Alcohol/week: 0.0 standard drinks   Drug use: No   Sexual activity: Not Currently  Other Topics Concern   Not on file  Social History Narrative   Lives alone   Social Determinants of Health   Financial Resource  Strain: Not on file  Food Insecurity: Not on file  Transportation Needs: Not on file  Physical Activity: Not on file  Stress: Not on file  Social Connections: Not on file   Past Surgical History:  Procedure Laterality Date   HERNIA REPAIR     Past Surgical History:  Procedure Laterality Date   HERNIA REPAIR     Past Medical History:  Diagnosis Date   Diabetic peripheral neuropathy associated with type 2 diabetes mellitus 03/14/2017   DOE (dyspnea on exertion) 11/2019   Onset jan 2021 with covid 19  - assoc with atypical cp developed during the infection present mostly sitting/ absent supine/ better with deep breathing/ no worse with ex - 01/30/2020   Walked RA x two laps =  approx 561ft @ brisk pace - stopped due to end of study, no sob/ no change cp  with sats of 93 % at the end of the study and no acute ekg changes  - Echo  02/13/2020  wnl with  no pericadial ef   Fatigue 11/2019   History of COVID-19 11/2019   Hyperlipidemia associated with type 2 diabetes mellitus 09/16/2015   Hypertension associated with type 2 diabetes mellitus 09/16/2015   Knee osteoarthritis 04/14/2016   Mild neurocognitive disorder 06/12/2020   Numbness of lower extremity 11/2019   Attributed to COVID-19   Sleep apnea    Patient denied being diagnosed with OSA in the past   BP 131/79   Pulse 72   Ht 6\' 1"  (1.854 m)   Wt 265 lb 6.4 oz (120.4 kg)   SpO2 97%   BMI 35.02 kg/m   Opioid Risk Score:   Fall Risk Score:  `1  Depression screen PHQ 2/9  Depression screen [facility] 2/9 10/02/2021 06/04/2021 11/17/2020 08/08/2020 05/02/2020 04/24/2020 12/06/2019  Decreased Interest 0 0 0 0 2 2 0  Down, Depressed, Hopeless 0 0 0 0 0 0 0  PHQ - 2 Score 0 0 0 0 2 2 0  Altered sleeping - - - - 0 - -  Tired, decreased energy - - - - 2 - -  Change in appetite - - - - 0 - -  Feeling bad or failure about yourself  - - - - 0 - -  Trouble concentrating - - - - 0 - -  Moving slowly or fidgety/restless - - - - 1 - -  Suicidal  thoughts - - - - 0 - -  PHQ-9 Score - - - - 5 - -  Difficult doing work/chores - - - - Somewhat difficult - -   Review of Systems  Constitutional: Negative.   HENT: Negative.    Eyes: Negative.  Respiratory: Negative.    Cardiovascular: Negative.   Gastrointestinal: Negative.   Endocrine: Negative.   Genitourinary: Negative.   Musculoskeletal:  Positive for gait problem.       Left shoulder pain , right and left knee pain , left leg pain  Skin: Negative.   Allergic/Immunologic: Negative.   Hematological: Negative.   Psychiatric/Behavioral: Negative.        Objective:   Physical Exam Gen: no distress, normal appearing HEENT: oral mucosa pink and moist, NCAT Cardio: Reg rate Chest: normal effort, normal rate of breathing Abd: soft, non-distended Ext: no edema Psych: pleasant, normal affect Neuro: Alert and oriented x3. Normal gait. No decreased sensation. No openings on feet    Assessment & Plan:  Casey Hunter is a 65 year old man who presents for follow-up on LONG COVID symptoms.   1) HTN: BP elevated today. Continue Losartan 25mg   HTN: -BP is ___today.  -Advised checking BP daily at home and logging results to bring into follow-up appointment with her PCP and myself. -Reviewed BP meds today.  -Advised regarding healthy foods that can help lower blood pressure and provided with a list: 1) citrus foods- high in vitamins and minerals 2) salmon and other fatty fish - reduces inflammation and oxylipins 3) swiss chard (leafy green)- high level of nitrates 4) pumpkin seeds- one of the best natural sources of magnesium 5) Beans and lentils- high in fiber, magnesium, and potassium 6) Berries- high in flavonoids 7) Amaranth (whole grain, can be cooked similarly to rice and oats)- high in magnesium and fiber 8) Pistachios- even more effective at reducing BP than other nuts 9) Carrots- high in phenolic compounds that relax blood vessels and reduce inflammation 10) Celery- contain  phthalides that relax tissues of arterial walls 11) Tomatoes- can also improve cholesterol and reduce risk of heart disease 12) Broccoli- good source of magnesium, calcium, and potassium 13) Greek yogurt: high in potassium and calcium 14) Herbs and spices: Celery seed, cilantro, saffron, lemongrass, black cumin, ginseng, cinnamon, cardamom, sweet basil, and ginger 15) Chia and flax seeds- also help to lower cholesterol and blood sugar 16) Beets- high levels of nitrates that relax blood vessels  17) spinach and bananas- high in potassium  -Provided lise of supplements that can help with hypertension:  1) magnesium: one high quality brand is Bioptemizers since it contains all 7 types of magnesium, otherwise over the counter magnesium gluconate 400mg  is a good option 2) B vitamins 3) vitamin D 4) potassium 5) CoQ10 6) L-arginine 7) Vitamin C 8) Beetroot -Educated that goal BP is 120/80. -Made goal to incorporate some of the above foods into diet.     2) Fatigue: Improved but still present. Continue NAC 600mg  BID and gave list of foods that increase NAC. Off Ritalin. Increase work to 4 days per week until re-eval on April 1st. Provided with note. This is one of the reason he feels he needs to retire.   3) Low vitamin D: repeat Vitamin D today.   4) Leg numbness: Persists, but improved. Explained etiologies of nerve injury 2/2 COVID-19 vs. Diabetic neuropathy.   5) Obesity: Weight 265- lost 5 lbs since last visit- He eats salmon once per week, eggs daily, nuts, meat, olive oil. Made goal to avoid sugar. Discussed benefits of intermittent fasting. -Educated regarding health benefits of weight loss- for pain, general health, chronic disease prevention, immune health, mental health.  -Will monitor weight every visit.  -Consider [place] tea daily.  -Discussed the benefits of  intermittent fasting. -Discussed foods that can assist in weight loss: 1) leafy greens- high in fiber and nutrients 2)  dark chocolate- improves metabolism (if prefer sweetened, best to sweeten with honey instead of sugar).  3) cruciferous vegetables- high in fiber and protein 4) full fat yogurt: high in healthy fat, protein, calcium, and probiotics 5) apples- high in a variety of phytochemicals 6) nuts- high in fiber and protein that increase feelings of fullness 7) grapefruit: rich in nutrients, antioxidants, and fiber (not to be taken with anticoagulation) 8) beans- high in protein and fiber 9) salmon- has high quality protein and healthy fats 10) green tea- rich in polyphenols 11) eggs- rich in choline and vitamin D 12) tuna- high protein, boosts metabolism 13) avocado- decreases visceral abdominal fat 14) chicken (pasture raised): high in protein and iron 15) blueberries- reduce abdominal fat and cholesterol 16) whole grains- decreases calories retained during digestion, speeds metabolism 17) chia seeds- curb appetite 18) chilies- increases fat metabolism   6) DM 2: HgbA1c improving.  PCP note reviewed- overall improved. Last checked about a month ago.  -check CBGs daily, log, and bring log to follow-up appointment -avoid sugar, bread, pasta, rice -avoid snacking -perform daily foot exam and at least annual eye exam -try to incorporate into your diet some of the following foods which are good for diabetes: 1) cinnamon- imitates effects of insulin, increasing glucose transport into cells ([place] or [place] cinnamon is best, least processed) 2) nuts- can slow down the blood sugar response of carbohydrate rich foods 3) oatmeal- contains and anti-inflammatory compound avenanthramide 4) whole-milk yogurt (best types are no sugar, [place] yogurt, or goat/sheep yogurt) 5) beans- high in protein, fiber, and vitamins, low glycemic index 6) broccoli- great source of vitamin A and C 7) quinoa- higher in protein and fiber than other grains 8) spinach- high in vitamin A, fiber, and protein 9) olive oil-  reduces glucose levels, LDL, and triglycerides 10) salmon- excellent amount of omega-3-fatty acids 11) walnuts- rich in antioxidants 12) apples- high in fiber and quercetin 13) carrots- highly nutritious with low impact on blood sugar 14) eggs- improve HDL (good cholesterol), high in protein, keep you satiated 15) turmeric: improves blood sugars, cardiovascular disease, and protects kidney health 16) garlic: improves blood sugar, blood pressure, pain 17) tomatoes: highly nutritious with low impact on blood sugar   7) HLD -really improved on last labs! -continue Rapatha  8) Diabetic peripheral neuropathy -Discussed Qutenza as an option for neuropathic pain control. Discussed that this is a capsaicin patch, stronger than capsaicin cream. Discussed that it is currently approved for diabetic peripheral neuropathy and post-herpetic neuralgia, but that it has also shown benefit in treating other forms of neuropathy. Provided patient with link to site to learn more about the patch: https://www.clark.biz/. Discussed that the patch would be placed in office and benefits usually last 3 months. Discussed that unintended exposure to capsaicin can cause severe irritation of eyes, mucous membranes, respiratory tract, and skin, but that Qutenza is a local treatment and does not have the systemic side effects of other nerve medications. Discussed that there may be pain, itching, erythema, and decreased sensory function associated with the application of Qutenza. Side effects usually subside within 1 week. A cold pack of analgesic medications can help with these side effects. Blood pressure can also be increased due to pain associated with administration of the patch.  4 patches of Qutenza was applied to the area of pain. Ice packs were applied during the procedure  to ensure patient comfort. Blood pressure was monitored every 15 minutes. The patient tolerated the procedure well. Post-procedure instructions were  given and follow-up has been scheduled.  9) BPH: -increase flomax to 0.8mg  HS    10) Decreased muscle mass, brain fog, decreased strength: -check testosterone and estrone  11) Elevated PSA -check today.   General health: He has received Shingles and pneumococcal vaccines, early flu shot, and 2nd COVID booster.   >40 minutes sent in applying Qutenza, discussing response to last treatment, dicsussed Cbgs, HTN, long COVID, diet, foods that can help for hypertension, diabetes, weight loss, urinary symptoms, increasing flomax, potential long COVID treatments, trying pepcid and claritin.

## 2021-10-06 ENCOUNTER — Other Ambulatory Visit: Payer: Self-pay

## 2021-10-06 ENCOUNTER — Encounter (HOSPITAL_BASED_OUTPATIENT_CLINIC_OR_DEPARTMENT_OTHER): Payer: Medicare Other | Admitting: Physical Medicine and Rehabilitation

## 2021-10-06 DIAGNOSIS — R972 Elevated prostate specific antigen [PSA]: Secondary | ICD-10-CM | POA: Diagnosis not present

## 2021-10-06 DIAGNOSIS — E349 Endocrine disorder, unspecified: Secondary | ICD-10-CM

## 2021-10-06 DIAGNOSIS — E559 Vitamin D deficiency, unspecified: Secondary | ICD-10-CM

## 2021-10-06 LAB — PSA: Prostate Specific Ag, Serum: 1.1 ng/mL (ref 0.0–4.0)

## 2021-10-06 LAB — MAGNESIUM: Magnesium: 2 mg/dL (ref 1.6–2.3)

## 2021-10-06 LAB — VITAMIN D 25 HYDROXY (VIT D DEFICIENCY, FRACTURES): Vit D, 25-Hydroxy: 25.8 ng/mL — ABNORMAL LOW (ref 30.0–100.0)

## 2021-10-06 LAB — TESTOSTERONE: Testosterone: 246 ng/dL — ABNORMAL LOW (ref 264–916)

## 2021-10-06 LAB — ESTRONE: Estrone: 72 pg/mL (ref 0–174)

## 2021-10-06 MED ORDER — VITAMIN D (ERGOCALCIFEROL) 1.25 MG (50000 UNIT) PO CAPS: 50000.0000 [IU] | ORAL_CAPSULE | ORAL | 0 refills | Status: DC

## 2021-10-06 NOTE — Progress Notes (Addendum)
Subjective:    Patient ID: Casey Hunter., male    DOB: July 12, 1956, 65 y.o.   MRN: [id number]  HPI  Casey Hunter returns for f/u of LONG COVID symptoms.   1) Long COVID symptoms -left leg still falls asleep- ready to try Qutenza today -some nights it can be pretty bad -it is not usually painful -he is trying to work on his diet. -he is taking the Lyrica two at night before sleeping -he has taken some ivermectin-was taking it every day when he felt bad.  -he asks about taking Pepcid and Claritin -does feel much better -feels like he is getting enough protein -he does not eat much citrus fruits due to his diabetes.  -he loves to eat strawberries and blackberries -checked testosterone level and discussed that his levels  -discussed vitamin D level and high dose supplementation  2) Diabetic peripheral neuropathy: -both feet feel cold -His CBGs have been harder to control since COVID -discussed his diet thoroughly.  -got 60% improvement with Qutenza, ready to try again today -pain is mostly present on soles of both feet- sometimes in his toes -tolerated last session well without any burning  3) BPH: -the tamsulosin has been helping him.  4) Fatigue -Contnues to have severe fatigue at times.  -yesterday was a bad tay -panning to return in August.   5) HTN: -BP is 131/79 -He has been taking Losartan  - this is his only BP med- BP has been very well controlled.  -he has been checking BP at home and it has been stable.  -he likes cinnamon   He continues to have numbness in his left calf. It is better than initially. His left hand will also sleep. It hurts but he is not sure if this is pain.  He has been having increase readings on his Freestyle Libre device- around 200s. For breakfast he eats eggs and sausage. For lunch he eats barebcue and sometimes french fries. Once in a while he eats sugar. He drinks water and unsweet tea and a fruit drink. He eats two bananas every  day.   He has been checking pressures every day and they have been well controlled. He takes his BP medication 5 days per week.  Prior history: Dizziness is better, but still present at times. He has brought with him an excellent log of his AM and PM blood pressures, which are excellent off any blood pressure medications.   Obesity: He has made excellent changes to his diet. He has lost 2 lbs since last visit (currently 266 lbs). He has been eating one banana per day, eggs daily, salmon once per week, meat, and a lot of nuts. He feels he still needs to minimize sweets.   Energy: Has greatly improved but is still not at the level required for his work. He has weaned off the Ritalin. Discussed goal of restarting work on October 11th for three days per week and he is agreeable.   Reviewed PCP note and he has been doing much better. HbA1c has improved to 7.7  Left leg numbness continues but is slightly improved.     Pain Inventory Average Pain 4 Pain Right Now 7 My pain is intermittent, constant, and tingling  In the last 24 hours, has pain interfered with the following? General activity 4 Relation with others 2 Enjoyment of life 5 What TIME of day is your pain at its worst? varies Sleep (in general) Fair  Pain is worse with: walking,  bending, and some activites Pain improves with: medication Relief from Meds: 5  Family History  Problem Relation Age of Onset   Hypertension Mother    Hyperlipidemia Mother    Hyperlipidemia Father    Social History   Socioeconomic History   Marital status: Divorced    Spouse name: Not on file   Number of children: 2   Years of education: 12   Highest education level: High school graduate  Occupational History   Not on file  Tobacco Use   Smoking status: Never   Smokeless tobacco: Never  Vaping Use   Vaping Use: Never used  Substance and Sexual Activity   Alcohol use: No    Alcohol/week: 0.0 standard drinks   Drug use: No   Sexual  activity: Not Currently  Other Topics Concern   Not on file  Social History Narrative   Lives alone   Social Determinants of Health   Financial Resource Strain: Not on file  Food Insecurity: Not on file  Transportation Needs: Not on file  Physical Activity: Not on file  Stress: Not on file  Social Connections: Not on file   Past Surgical History:  Procedure Laterality Date   HERNIA REPAIR     Past Surgical History:  Procedure Laterality Date   HERNIA REPAIR     Past Medical History:  Diagnosis Date   Diabetic peripheral neuropathy associated with type 2 diabetes mellitus 03/14/2017   DOE (dyspnea on exertion) 11/2019   Onset jan 2021 with covid 19  - assoc with atypical cp developed during the infection present mostly sitting/ absent supine/ better with deep breathing/ no worse with ex - 01/30/2020   Walked RA x two laps =  approx 540ft @ brisk pace - stopped due to end of study, no sob/ no change cp  with sats of 93 % at the end of the study and no acute ekg changes  - Echo  02/13/2020  wnl with  no pericadial ef   Fatigue 11/2019   History of COVID-19 11/2019   Hyperlipidemia associated with type 2 diabetes mellitus 09/16/2015   Hypertension associated with type 2 diabetes mellitus 09/16/2015   Knee osteoarthritis 04/14/2016   Mild neurocognitive disorder 06/12/2020   Numbness of lower extremity 11/2019   Attributed to COVID-19   Sleep apnea    Patient denied being diagnosed with OSA in the past   There were no vitals taken for this visit.  Opioid Risk Score:   Fall Risk Score:  `1  Depression screen PHQ 2/9  Depression screen [facility] 2/9 10/02/2021 06/04/2021 11/17/2020 08/08/2020 05/02/2020 04/24/2020 12/06/2019  Decreased Interest 0 0 0 0 2 2 0  Down, Depressed, Hopeless 0 0 0 0 0 0 0  PHQ - 2 Score 0 0 0 0 2 2 0  Altered sleeping - - - - 0 - -  Tired, decreased energy - - - - 2 - -  Change in appetite - - - - 0 - -  Feeling bad or failure about yourself  - - - - 0 - -   Trouble concentrating - - - - 0 - -  Moving slowly or fidgety/restless - - - - 1 - -  Suicidal thoughts - - - - 0 - -  PHQ-9 Score - - - - 5 - -  Difficult doing work/chores - - - - Somewhat difficult - -   Review of Systems  Constitutional: Negative.   HENT: Negative.    Eyes: Negative.  Respiratory: Negative.    Cardiovascular: Negative.   Gastrointestinal: Negative.   Endocrine: Negative.   Genitourinary: Negative.   Musculoskeletal:  Positive for gait problem.       Left shoulder pain , right and left knee pain , left leg pain  Skin: Negative.   Allergic/Immunologic: Negative.   Hematological: Negative.   Psychiatric/Behavioral: Negative.        Objective:   Physical Exam Not performed as patient was seen via phone.     Assessment & Plan:  Mr. Ruz is a 65 year old man who presents for follow-up on LONG COVID symptoms.   1) HTN: excellent control. Continue Losartan 25mg   -Advised checking BP daily at home and logging results to bring into follow-up appointment with her PCP and myself. -Reviewed BP meds today.  -Advised regarding healthy foods that can help lower blood pressure and provided with a list: 1) citrus foods- high in vitamins and minerals 2) salmon and other fatty fish - reduces inflammation and oxylipins 3) swiss chard (leafy green)- high level of nitrates 4) pumpkin seeds- one of the best natural sources of magnesium 5) Beans and lentils- high in fiber, magnesium, and potassium 6) Berries- high in flavonoids 7) Amaranth (whole grain, can be cooked similarly to rice and oats)- high in magnesium and fiber 8) Pistachios- even more effective at reducing BP than other nuts 9) Carrots- high in phenolic compounds that relax blood vessels and reduce inflammation 10) Celery- contain phthalides that relax tissues of arterial walls 11) Tomatoes- can also improve cholesterol and reduce risk of heart disease 12) Broccoli- good source of magnesium, calcium, and  potassium 13) Greek yogurt: high in potassium and calcium 14) Herbs and spices: Celery seed, cilantro, saffron, lemongrass, black cumin, ginseng, cinnamon, cardamom, sweet basil, and ginger 15) Chia and flax seeds- also help to lower cholesterol and blood sugar 16) Beets- high levels of nitrates that relax blood vessels  17) spinach and bananas- high in potassium  -Provided lise of supplements that can help with hypertension:  1) magnesium: one high quality brand is Bioptemizers since it contains all 7 types of magnesium, otherwise over the counter magnesium gluconate 400mg  is a good option 2) B vitamins 3) vitamin D 4) potassium 5) CoQ10 6) L-arginine 7) Vitamin C 8) Beetroot -Educated that goal BP is 120/80. -Made goal to incorporate some of the above foods into diet.     2) Fatigue: Improved but still present. Continue NAC 600mg  BID and gave list of foods that increase NAC. Off Ritalin. Increase work to 4 days per week until re-eval on April 1st. Provided with note. This is one of the reason he feels he needs to retire.   3) Vitamin D deficiency -prescribed ergocalciferol 50,000U once per week for 7 weeks  4) Leg numbness: Persists, but improved. Explained etiologies of nerve injury 2/2 COVID-19 vs. Diabetic neuropathy.   5) Obesity: Weight 265- lost 5 lbs since last visit- He eats salmon once per week, eggs daily, nuts, meat, olive oil. Made goal to avoid sugar. Discussed benefits of intermittent fasting. -Educated regarding health benefits of weight loss- for pain, general health, chronic disease prevention, immune health, mental health.  -Will monitor weight every visit.  -Consider Ro[place]ois tea daily.  -Discussed the benefits of intermittent fasting. -Discussed foods that can assist in weight loss: 1) leafy greens- high in fiber and nutrients 2) dark chocolate- improves metabolism (if prefer sweetened, best to sweeten with honey instead of sugar).  3) cruciferous vegetables-  high in fiber and protein 4) full fat yogurt: high in healthy fat, protein, calcium, and probiotics 5) apples- high in a variety of phytochemicals 6) nuts- high in fiber and protein that increase feelings of fullness 7) grapefruit: rich in nutrients, antioxidants, and fiber (not to be taken with anticoagulation) 8) beans- high in protein and fiber 9) salmon- has high quality protein and healthy fats 10) green tea- rich in polyphenols 11) eggs- rich in choline and vitamin D 12) tuna- high protein, boosts metabolism 13) avocado- decreases visceral abdominal fat 14) chicken (pasture raised): high in protein and iron 15) blueberries- reduce abdominal fat and cholesterol 16) whole grains- decreases calories retained during digestion, speeds metabolism 17) chia seeds- curb appetite 18) chilies- increases fat metabolism   6) DM 2: HgbA1c improving.  PCP note reviewed- overall improved. Last checked about a month ago.  -check CBGs daily, log, and bring log to follow-up appointment -avoid sugar, bread, pasta, rice -avoid snacking -perform daily foot exam and at least annual eye exam -try to incorporate into your diet some of the following foods which are good for diabetes: 1) cinnamon- imitates effects of insulin, increasing glucose transport into cells ([place] or [place] cinnamon is best, least processed) 2) nuts- can slow down the blood sugar response of carbohydrate rich foods 3) oatmeal- contains and anti-inflammatory compound avenanthramide 4) whole-milk yogurt (best types are no sugar, [place] yogurt, or goat/sheep yogurt) 5) beans- high in protein, fiber, and vitamins, low glycemic index 6) broccoli- great source of vitamin A and C 7) quinoa- higher in protein and fiber than other grains 8) spinach- high in vitamin A, fiber, and protein 9) olive oil- reduces glucose levels, LDL, and triglycerides 10) salmon- excellent amount of omega-3-fatty acids 11) walnuts- rich in  antioxidants 12) apples- high in fiber and quercetin 13) carrots- highly nutritious with low impact on blood sugar 14) eggs- improve HDL (good cholesterol), high in protein, keep you satiated 15) turmeric: improves blood sugars, cardiovascular disease, and protects kidney health 16) garlic: improves blood sugar, blood pressure, pain 17) tomatoes: highly nutritious with low impact on blood sugar   7) HLD -really improved on last labs! -continue Rapatha -repeat lipid panel with January labs  8) Diabetic peripheral neuropathy -Discussed Qutenza as an option for neuropathic pain control. Discussed that this is a capsaicin patch, stronger than capsaicin cream. Discussed that it is currently approved for diabetic peripheral neuropathy and post-herpetic neuralgia, but that it has also shown benefit in treating other forms of neuropathy. Provided patient with link to site to learn more about the patch: https://www.clark.biz/. Discussed that the patch would be placed in office and benefits usually last 3 months. Discussed that unintended exposure to capsaicin can cause severe irritation of eyes, mucous membranes, respiratory tract, and skin, but that Qutenza is a local treatment and does not have the systemic side effects of other nerve medications. Discussed that there may be pain, itching, erythema, and decreased sensory function associated with the application of Qutenza. Side effects usually subside within 1 week. A cold pack of analgesic medications can help with these side effects. Blood pressure can also be increased due to pain associated with administration of the patch.  4 patches of Qutenza was applied to the area of pain. Ice packs were applied during the procedure to ensure patient comfort. Blood pressure was monitored every 15 minutes. The patient tolerated the procedure well. Post-procedure instructions were given and follow-up has been scheduled. -he tolerated well without discomfort- repeat  in 3 months.   9) BPH: -continue flomax to 0.8mg  HS    10) Decreased muscle mass, brain fog, decreased strength: -check testosterone and estrone  11) Elevated PSA -normalized on 11/22 labs- discussed with Mr. [place]ake  12) testosterone deficiency: -encouraged vitamin D supplementation, resistance training, high protein/low carb diet.   General health: He has received Shingles and pneumococcal vaccines, early flu shot, and 2nd COVID booster.   10 minutes spent in discussion of lab work, low testosterone and vitamin D levels, recommended exercise, supplementation, and nutritional changes

## 2021-10-07 ENCOUNTER — Telehealth: Payer: Self-pay

## 2021-10-07 NOTE — Telephone Encounter (Signed)
Clarify directions for Tamsulosin. Which is the correct directions Casey 2 capsules(0.8 mg) by mouth daily after Hunter or Casey Hunter?

## 2021-10-07 NOTE — Telephone Encounter (Signed)
[facility] [employer] notified of clarification

## 2021-10-07 NOTE — Telephone Encounter (Signed)
Pharmacy notified.

## 2021-10-22 ENCOUNTER — Other Ambulatory Visit: Payer: Self-pay | Admitting: Physical Medicine and Rehabilitation

## 2021-10-22 MED ORDER — NIFEDIPINE ER OSMOTIC RELEASE 30 MG PO TB24: 30.0000 mg | ORAL_TABLET | Freq: Every day | ORAL | 11 refills | Status: DC

## 2021-11-18 ENCOUNTER — Telehealth: Payer: Self-pay

## 2021-11-18 NOTE — Telephone Encounter (Signed)
PA for Nifedipine ER created and submitted in Covermy Med

## 2021-11-20 NOTE — Telephone Encounter (Signed)
Request Reference Number: [id number]. NIFEDIPINE TAB 30MG  ER is approved through 11/21/2022. Pharmacy notified.

## 2021-11-30 ENCOUNTER — Other Ambulatory Visit: Payer: Self-pay | Admitting: Physical Medicine and Rehabilitation

## 2021-11-30 ENCOUNTER — Telehealth: Payer: Self-pay | Admitting: *Deleted

## 2021-11-30 DIAGNOSIS — E118 Type 2 diabetes mellitus with unspecified complications: Secondary | ICD-10-CM

## 2021-11-30 DIAGNOSIS — E1142 Type 2 diabetes mellitus with diabetic polyneuropathy: Secondary | ICD-10-CM

## 2021-11-30 DIAGNOSIS — M170 Bilateral primary osteoarthritis of knee: Secondary | ICD-10-CM

## 2021-11-30 DIAGNOSIS — E1169 Type 2 diabetes mellitus with other specified complication: Secondary | ICD-10-CM

## 2021-11-30 MED ORDER — VITAMIN D (ERGOCALCIFEROL) 1.25 MG (50000 UNIT) PO CAPS: 50000.0000 [IU] | ORAL_CAPSULE | ORAL | 0 refills | Status: DC

## 2021-11-30 MED ORDER — DAPAGLIFLOZIN PROPANEDIOL 10 MG PO TABS: 10.0000 mg | ORAL_TABLET | Freq: Every day | ORAL | 3 refills | Status: DC

## 2021-11-30 MED ORDER — N-ACETYL CYSTEINE 600 MG PO TABS: 1.0000 | ORAL_TABLET | Freq: Two times a day (BID) | ORAL | 3 refills | Status: DC

## 2021-11-30 MED ORDER — REPATHA SURECLICK 140 MG/ML ~~LOC~~ SOAJ: 140.0000 mg | SUBCUTANEOUS | 3 refills | Status: DC

## 2021-11-30 MED ORDER — METFORMIN HCL 1000 MG PO TABS: 1000.0000 mg | ORAL_TABLET | Freq: Two times a day (BID) | ORAL | 3 refills | Status: DC

## 2021-11-30 MED ORDER — LOVASTATIN 20 MG PO TABS: 20.0000 mg | ORAL_TABLET | Freq: Every day | ORAL | 3 refills | Status: DC

## 2021-11-30 MED ORDER — PREGABALIN 75 MG PO CAPS: 75.0000 mg | ORAL_CAPSULE | Freq: Three times a day (TID) | ORAL | 3 refills | Status: DC

## 2021-11-30 MED ORDER — TAMSULOSIN HCL 0.4 MG PO CAPS: 0.8000 mg | ORAL_CAPSULE | Freq: Every day | ORAL | 3 refills | Status: DC

## 2021-11-30 MED ORDER — TRULICITY 4.5 MG/0.5ML ~~LOC~~ SOAJ: 4.5000 mg | SUBCUTANEOUS | 3 refills | Status: DC

## 2021-11-30 MED ORDER — NIFEDIPINE ER OSMOTIC RELEASE 30 MG PO TB24: 30.0000 mg | ORAL_TABLET | Freq: Every day | ORAL | 3 refills | Status: DC

## 2021-11-30 MED ORDER — LOSARTAN POTASSIUM 25 MG PO TABS: 25.0000 mg | ORAL_TABLET | Freq: Every day | ORAL | 3 refills | Status: DC

## 2021-11-30 MED ORDER — MELOXICAM 15 MG PO TABS: 15.0000 mg | ORAL_TABLET | Freq: Every day | ORAL | 1 refills | Status: DC | PRN

## 2021-11-30 MED ORDER — NIFEDIPINE ER OSMOTIC RELEASE 30 MG PO TB24: 30.0000 mg | ORAL_TABLET | Freq: Every day | ORAL | 11 refills | Status: DC

## 2021-11-30 NOTE — Telephone Encounter (Signed)
Dawn called from [employer] [facility] for clarification of Tamsulosin order sent in today. It has two sets of directions and clarification is needed.

## 2021-12-02 ENCOUNTER — Telehealth: Payer: Self-pay

## 2021-12-02 NOTE — Telephone Encounter (Signed)
Appeal submitted through CoverMyMeds because insurance denied Repatha Sure Click

## 2021-12-02 NOTE — Telephone Encounter (Signed)
PA for Repatha SureClick sent to insurance

## 2021-12-03 ENCOUNTER — Other Ambulatory Visit: Payer: Self-pay | Admitting: Physical Medicine and Rehabilitation

## 2021-12-03 MED ORDER — OZEMPIC (0.25 OR 0.5 MG/DOSE) 2 MG/1.5ML ~~LOC~~ SOPN: 0.5000 mg | PEN_INJECTOR | SUBCUTANEOUS | 3 refills | Status: DC

## 2021-12-07 NOTE — Telephone Encounter (Signed)
Appeal questions faxed to [facility] for Repatha

## 2021-12-08 ENCOUNTER — Other Ambulatory Visit: Payer: Self-pay | Admitting: *Deleted

## 2021-12-08 MED ORDER — TAMSULOSIN HCL 0.4 MG PO CAPS: 0.8000 mg | ORAL_CAPSULE | Freq: Every day | ORAL | 3 refills | Status: DC

## 2021-12-09 NOTE — Telephone Encounter (Signed)
Relpatha was denied. Prior auth submitted to insurance for [employer] via CoverMyMeds.

## 2021-12-10 ENCOUNTER — Other Ambulatory Visit: Payer: Self-pay | Admitting: Physical Medicine and Rehabilitation

## 2021-12-10 DIAGNOSIS — E118 Type 2 diabetes mellitus with unspecified complications: Secondary | ICD-10-CM

## 2021-12-10 MED ORDER — TRULICITY 4.5 MG/0.5ML ~~LOC~~ SOAJ: 4.5000 mg | SUBCUTANEOUS | 3 refills | Status: DC

## 2021-12-10 NOTE — Telephone Encounter (Signed)
Ozempic was denied. Dr Carlis Abbott is aware.

## 2021-12-14 ENCOUNTER — Other Ambulatory Visit: Payer: Self-pay | Admitting: Physical Medicine and Rehabilitation

## 2021-12-14 MED ORDER — PREGABALIN 75 MG PO CAPS: 75.0000 mg | ORAL_CAPSULE | Freq: Three times a day (TID) | ORAL | 3 refills | Status: DC

## 2021-12-15 ENCOUNTER — Other Ambulatory Visit: Payer: Self-pay

## 2021-12-16 MED ORDER — PREGABALIN 75 MG PO CAPS: 75.0000 mg | ORAL_CAPSULE | Freq: Three times a day (TID) | ORAL | 3 refills | Status: DC

## 2021-12-18 ENCOUNTER — Other Ambulatory Visit: Payer: Self-pay | Admitting: Physical Medicine and Rehabilitation

## 2021-12-18 MED ORDER — PREGABALIN 75 MG PO CAPS: 75.0000 mg | ORAL_CAPSULE | Freq: Three times a day (TID) | ORAL | 3 refills | Status: DC

## 2021-12-29 ENCOUNTER — Other Ambulatory Visit: Payer: Self-pay | Admitting: Physical Medicine and Rehabilitation

## 2021-12-29 DIAGNOSIS — E349 Endocrine disorder, unspecified: Secondary | ICD-10-CM

## 2021-12-29 DIAGNOSIS — E118 Type 2 diabetes mellitus with unspecified complications: Secondary | ICD-10-CM

## 2021-12-29 DIAGNOSIS — E785 Hyperlipidemia, unspecified: Secondary | ICD-10-CM

## 2021-12-29 DIAGNOSIS — E1169 Type 2 diabetes mellitus with other specified complication: Secondary | ICD-10-CM

## 2021-12-29 DIAGNOSIS — R252 Cramp and spasm: Secondary | ICD-10-CM

## 2021-12-29 DIAGNOSIS — N401 Enlarged prostate with lower urinary tract symptoms: Secondary | ICD-10-CM

## 2021-12-29 DIAGNOSIS — E559 Vitamin D deficiency, unspecified: Secondary | ICD-10-CM

## 2021-12-30 LAB — LIPID PANEL

## 2022-01-01 ENCOUNTER — Other Ambulatory Visit: Payer: Self-pay

## 2022-01-01 ENCOUNTER — Encounter
Payer: Medicare Other | Attending: Physical Medicine and Rehabilitation | Admitting: Physical Medicine and Rehabilitation

## 2022-01-01 DIAGNOSIS — E559 Vitamin D deficiency, unspecified: Secondary | ICD-10-CM | POA: Insufficient documentation

## 2022-01-01 DIAGNOSIS — E349 Endocrine disorder, unspecified: Secondary | ICD-10-CM | POA: Insufficient documentation

## 2022-01-01 DIAGNOSIS — E785 Hyperlipidemia, unspecified: Secondary | ICD-10-CM | POA: Insufficient documentation

## 2022-01-01 DIAGNOSIS — E1169 Type 2 diabetes mellitus with other specified complication: Secondary | ICD-10-CM | POA: Diagnosis not present

## 2022-01-01 DIAGNOSIS — E118 Type 2 diabetes mellitus with unspecified complications: Secondary | ICD-10-CM | POA: Insufficient documentation

## 2022-01-01 MED ORDER — TRULICITY 4.5 MG/0.5ML ~~LOC~~ SOAJ: 4.5000 mg | SUBCUTANEOUS | 3 refills | Status: DC

## 2022-01-01 NOTE — Progress Notes (Signed)
Subjective:    Patient ID: Casey Spiro., male    DOB: 1956-05-31, 66 y.o.   MRN: [id number]  HPI  An audio/video tele-health visit is felt to be the most appropriate encounter for this patient at this time. This is a follow up tele-visit via phone. The patient is at home. MD is at office. Prior to scheduling this appointment, our staff discussed the limitations of evaluation and management by telemedicine and the availability of in-person appointments. The patient expressed understanding and agreed to proceed.   Casey Hunter returns for f/u of LONG COVID symptoms.   1) Long COVID symptoms -left leg still falls asleep- ready to try Qutenza today -some nights it can be pretty bad -it is not usually painful -he is trying to work on his diet. -he is taking the Lyrica two at night before sleeping -he has taken some ivermectin-was taking it every day when he felt bad.  -he asks about taking Pepcid and Claritin -does feel much better -feels like he is getting enough protein -he does not eat much citrus fruits due to his diabetes.  -he loves to eat strawberries and blackberries -checked testosterone level and discussed that his levels  -discussed vitamin D level and high dose supplementation  2) Diabetic peripheral neuropathy: -both feet feel cold -His CBGs have been harder to control since COVID -discussed his diet thoroughly.  -got 60% improvement with Qutenza, ready to try again in March or April next visit.  -CBGs have been in 300s.  -has been out of Trulicty since Christmas -pain is mostly present on soles of both feet- sometimes in his toes -tolerated last session well without any burning -discussed his hemoglobin A1c  3) BPH: -the tamsulosin has been helping him.  4) Fatigue -Contnues to have severe fatigue at times.  -yesterday was a bad tay -panning to return in August.   5) HTN: -BP is 131/79 -has been well controlled -He has been taking Losartan 25mg  - this is  his only BP med- BP has been very well controlled.  -he has been checking BP at home and it has been stable.  -he likes cinnamon  6) Raynaud's syndrome: -the nifedipine helped his upper extremity neuropathy but his lower extremities are still very cold at night    He continues to have numbness in his left calf. It is better than initially. His left hand will also sleep. It hurts but he is not sure if this is pain.  He has been having increase readings on his Freestyle Libre device- around 200s. For breakfast he eats eggs and sausage. For lunch he eats barebcue and sometimes french fries. Once in a while he eats sugar. He drinks water and unsweet tea and a fruit drink. He eats two bananas every day.   He has been checking pressures every day and they have been well controlled. He takes his BP medication 5 days per week.  Prior history: Dizziness is better, but still present at times. He has brought with him an excellent log of his AM and PM blood pressures, which are excellent off any blood pressure medications.   Obesity: He has made excellent changes to his diet. He has lost 2 lbs since last visit (currently 266 lbs). He has been eating one banana per day, eggs daily, salmon once per week, meat, and a lot of nuts. He feels he still needs to minimize sweets.   Energy: Has greatly improved but is still not at the level required  for his work. He has weaned off the Ritalin. Discussed goal of restarting work on October 11th for three days per week and he is agreeable.   Reviewed PCP note and he has been doing much better. HbA1c has improved to 7.7  Left leg numbness continues but is slightly improved.     Pain Inventory Average Pain 4 Pain Right Now 7 My pain is intermittent, constant, and tingling  In the last 24 hours, has pain interfered with the following? General activity 4 Relation with others 2 Enjoyment of life 5 What TIME of day is your pain at its worst? varies Sleep (in  general) Fair  Pain is worse with: walking, bending, and some activites Pain improves with: medication Relief from Meds: 5  Family History  Problem Relation Age of Onset   Hypertension Mother    Hyperlipidemia Mother    Hyperlipidemia Father    Social History   Socioeconomic History   Marital status: Divorced    Spouse name: Not on file   Number of children: 2   Years of education: 12   Highest education level: High school graduate  Occupational History   Not on file  Tobacco Use   Smoking status: Never   Smokeless tobacco: Never  Vaping Use   Vaping Use: Never used  Substance and Sexual Activity   Alcohol use: No    Alcohol/week: 0.0 standard drinks   Drug use: No   Sexual activity: Not Currently  Other Topics Concern   Not on file  Social History Narrative   Lives alone   Social Determinants of Health   Financial Resource Strain: Not on file  Food Insecurity: Not on file  Transportation Needs: Not on file  Physical Activity: Not on file  Stress: Not on file  Social Connections: Not on file   Past Surgical History:  Procedure Laterality Date   HERNIA REPAIR     Past Surgical History:  Procedure Laterality Date   HERNIA REPAIR     Past Medical History:  Diagnosis Date   Diabetic peripheral neuropathy associated with type 2 diabetes mellitus 03/14/2017   DOE (dyspnea on exertion) 11/2019   Onset jan 2021 with covid 19  - assoc with atypical cp developed during the infection present mostly sitting/ absent supine/ better with deep breathing/ no worse with ex - 01/30/2020   Walked RA x two laps =  approx 575ft @ brisk pace - stopped due to end of study, no sob/ no change cp  with sats of 93 % at the end of the study and no acute ekg changes  - Echo  02/13/2020  wnl with  no pericadial ef   Fatigue 11/2019   History of COVID-19 11/2019   Hyperlipidemia associated with type 2 diabetes mellitus 09/16/2015   Hypertension associated with type 2 diabetes mellitus  09/16/2015   Knee osteoarthritis 04/14/2016   Mild neurocognitive disorder 06/12/2020   Numbness of lower extremity 11/2019   Attributed to COVID-19   Sleep apnea    Patient denied being diagnosed with OSA in the past   There were no vitals taken for this visit.  Opioid Risk Score:   Fall Risk Score:  `1  Depression screen PHQ 2/9  Depression screen [facility] 2/9 10/02/2021 06/04/2021 11/17/2020 08/08/2020 05/02/2020 04/24/2020 12/06/2019  Decreased Interest 0 0 0 0 2 2 0  Down, Depressed, Hopeless 0 0 0 0 0 0 0  PHQ - 2 Score 0 0 0 0 2 2 0  Altered  sleeping - - - - 0 - -  Tired, decreased energy - - - - 2 - -  Change in appetite - - - - 0 - -  Feeling bad or failure about yourself  - - - - 0 - -  Trouble concentrating - - - - 0 - -  Moving slowly or fidgety/restless - - - - 1 - -  Suicidal thoughts - - - - 0 - -  PHQ-9 Score - - - - 5 - -  Difficult doing work/chores - - - - Somewhat difficult - -   Review of Systems  Constitutional: Negative.   HENT: Negative.    Eyes: Negative.   Respiratory: Negative.    Cardiovascular: Negative.   Gastrointestinal: Negative.   Endocrine: Negative.   Genitourinary: Negative.   Musculoskeletal:  Positive for gait problem.       Left shoulder pain , right and left knee pain , left leg pain  Skin: Negative.   Allergic/Immunologic: Negative.   Hematological: Negative.   Psychiatric/Behavioral: Negative.        Objective:   Physical Exam Not performed as patient was seen via phone.     Assessment & Plan:  Casey Hunter is a 66 year old man who presents for follow-up on LONG COVID symptoms.   1) HTN: excellent control. Continue Losartan 25mg   -continue eating a banana every day -continue grapefruit daily -Advised checking BP daily at home and logging results to bring into follow-up appointment with her PCP and myself. -Reviewed BP meds today.  -Advised regarding healthy foods that can help lower blood pressure and provided with a list: 1)  citrus foods- high in vitamins and minerals 2) salmon and other fatty fish - reduces inflammation and oxylipins 3) swiss chard (leafy green)- high level of nitrates 4) pumpkin seeds- one of the best natural sources of magnesium 5) Beans and lentils- high in fiber, magnesium, and potassium 6) Berries- high in flavonoids 7) Amaranth (whole grain, can be cooked similarly to rice and oats)- high in magnesium and fiber 8) Pistachios- even more effective at reducing BP than other nuts 9) Carrots- high in phenolic compounds that relax blood vessels and reduce inflammation 10) Celery- contain phthalides that relax tissues of arterial walls 11) Tomatoes- can also improve cholesterol and reduce risk of heart disease 12) Broccoli- good source of magnesium, calcium, and potassium 13) Greek yogurt: high in potassium and calcium 14) Herbs and spices: Celery seed, cilantro, saffron, lemongrass, black cumin, ginseng, cinnamon, cardamom, sweet basil, and ginger 15) Chia and flax seeds- also help to lower cholesterol and blood sugar 16) Beets- high levels of nitrates that relax blood vessels  17) spinach and bananas- high in potassium  -Provided lise of supplements that can help with hypertension:  1) magnesium: one high quality brand is Bioptemizers since it contains all 7 types of magnesium, otherwise over the counter magnesium gluconate 400mg  is a good option 2) B vitamins 3) vitamin D 4) potassium 5) CoQ10 6) L-arginine 7) Vitamin C 8) Beetroot -Educated that goal BP is 120/80. -Made goal to incorporate some of the above foods into diet.     2) Fatigue: Improved but still present. Continue NAC 600mg  BID and gave list of foods that increase NAC. Off Ritalin. Increase work to 4 days per week until re-eval on April 1st. Provided with note. This is one of the reason he feels he needs to retire.   3) Vitamin D deficiency -prescribed ergocalciferol 50,000U once per week  for 7 weeks  4) Leg numbness:  Persists, but improved. Explained etiologies of nerve injury 2/2 COVID-19 vs. Diabetic neuropathy.   5) Obesity: Weight 265- lost 5 lbs since last visit- He eats salmon once per week, eggs daily, nuts, meat, olive oil. Made goal to avoid sugar. Discussed benefits of intermittent fasting. -Educated regarding health benefits of weight loss- for pain, general health, chronic disease prevention, immune health, mental health.  -Will monitor weight every visit.  -Consider [place] tea daily.  -Discussed the benefits of intermittent fasting. -Discussed foods that can assist in weight loss: 1) leafy greens- high in fiber and nutrients 2) dark chocolate- improves metabolism (if prefer sweetened, best to sweeten with honey instead of sugar).  3) cruciferous vegetables- high in fiber and protein 4) full fat yogurt: high in healthy fat, protein, calcium, and probiotics 5) apples- high in a variety of phytochemicals 6) nuts- high in fiber and protein that increase feelings of fullness 7) grapefruit: rich in nutrients, antioxidants, and fiber (not to be taken with anticoagulation) 8) beans- high in protein and fiber 9) salmon- has high quality protein and healthy fats 10) green tea- rich in polyphenols 11) eggs- rich in choline and vitamin D 12) tuna- high protein, boosts metabolism 13) avocado- decreases visceral abdominal fat 14) chicken (pasture raised): high in protein and iron 15) blueberries- reduce abdominal fat and cholesterol 16) whole grains- decreases calories retained during digestion, speeds metabolism 17) chia seeds- curb appetite 18) chilies- increases fat metabolism   6) DM 2: HgbA1c improving.  PCP note reviewed- overall improved. Last checked about a month ago.  -check CBGs daily, log, and bring log to follow-up appointment -avoid sugar, bread, pasta, rice -avoid snacking -Recommended 1 glass water with 1 TB apple cider vinegar before meals to reduce CBG spike, has additional health  benefits, drink with straw to protect enamel.   -prescribed Trulicity -perform daily foot exam and at least annual eye exam -try to incorporate into your diet some of the following foods which are good for diabetes: 1) cinnamon- imitates effects of insulin, increasing glucose transport into cells ([place] or [place] cinnamon is best, least processed) 2) nuts- can slow down the blood sugar response of carbohydrate rich foods 3) oatmeal- contains and anti-inflammatory compound avenanthramide 4) whole-milk yogurt (best types are no sugar, [place] yogurt, or goat/sheep yogurt) 5) beans- high in protein, fiber, and vitamins, low glycemic index 6) broccoli- great source of vitamin A and C 7) quinoa- higher in protein and fiber than other grains 8) spinach- high in vitamin A, fiber, and protein 9) olive oil- reduces glucose levels, LDL, and triglycerides 10) salmon- excellent amount of omega-3-fatty acids 11) walnuts- rich in antioxidants 12) apples- high in fiber and quercetin 13) carrots- highly nutritious with low impact on blood sugar 14) eggs- improve HDL (good cholesterol), high in protein, keep you satiated 15) turmeric: improves blood sugars, cardiovascular disease, and protects kidney health 16) garlic: improves blood sugar, blood pressure, pain 17) tomatoes: highly nutritious with low impact on blood sugar   7) HLD -really improved on last labs! -continue Rapatha -repeat lipid panel with January labs  8) Diabetic peripheral neuropathy -Discussed Qutenza as an option for neuropathic pain control. Discussed that this is a capsaicin patch, stronger than capsaicin cream. Discussed that it is currently approved for diabetic peripheral neuropathy and post-herpetic neuralgia, but that it has also shown benefit in treating other forms of neuropathy. Provided patient with link to site to learn more about the patch:  CinemaBonus.fr. Discussed that the patch would be placed in office and  benefits usually last 3 months. Discussed that unintended exposure to capsaicin can cause severe irritation of eyes, mucous membranes, respiratory tract, and skin, but that Qutenza is a local treatment and does not have the systemic side effects of other nerve medications. Discussed that there may be pain, itching, erythema, and decreased sensory function associated with the application of Qutenza. Side effects usually subside within 1 week. A cold pack of analgesic medications can help with these side effects. Blood pressure can also be increased due to pain associated with administration of the patch.   9) BPH: -continue flomax to 0.8mg  HS    10) Decreased muscle mass, brain fog, decreased strength: -check testosterone and estrone  11) Elevated PSA -normalized on 11/22 labs- discussed with Casey Hunter  12) testosterone deficiency: -encouraged vitamin D supplementation, resistance training, high protein/low carb diet.  -discussed that this has normalized.   General health: He has received Shingles and pneumococcal vaccines, early flu shot, and 2nd COVID booster.   15 minutes spent in discussion of his HgbA1c, and testosterone deficiency, diabetic peripheral neuropathy, vitamin D deficiency, lipid panel, blood pressure, sending his Trulicity in, discussion of diet

## 2022-01-02 ENCOUNTER — Other Ambulatory Visit: Payer: Self-pay | Admitting: Physical Medicine and Rehabilitation

## 2022-01-04 MED ORDER — TRULICITY 4.5 MG/0.5ML ~~LOC~~ SOAJ: 4.5000 mg | SUBCUTANEOUS | 3 refills | Status: DC

## 2022-01-04 NOTE — Addendum Note (Signed)
Addended by: Horton Chin on: 01/04/2022 04:43 PM   Modules accepted: Orders

## 2022-01-12 LAB — CBC
Hematocrit: 49.9 % (ref 37.5–51.0)
Hemoglobin: 16.2 g/dL (ref 13.0–17.7)
MCH: 27.8 pg (ref 26.6–33.0)
MCHC: 32.5 g/dL (ref 31.5–35.7)
MCV: 86 fL (ref 79–97)
Platelets: 335 10*3/uL (ref 150–450)
RBC: 5.82 x10E6/uL — ABNORMAL HIGH (ref 4.14–5.80)
RDW: 13.1 % (ref 11.6–15.4)
WBC: 8.5 10*3/uL (ref 3.4–10.8)

## 2022-01-12 LAB — LIPID PANEL
Chol/HDL Ratio: 2.7 ratio (ref 0.0–5.0)
Cholesterol, Total: 133 mg/dL (ref 100–199)
HDL: 49 mg/dL (ref >39)
LDL Chol Calc (NIH): 54 mg/dL (ref 0–99)
Triglycerides: 185 mg/dL — ABNORMAL HIGH (ref 0–149)
VLDL Cholesterol Cal: 30 mg/dL (ref 5–40)

## 2022-01-12 LAB — VITAMIN D 1,25 DIHYDROXY
Vitamin D 1, 25 (OH)2 Total: 39 pg/mL
Vitamin D2 1, 25 (OH)2: 31 pg/mL
Vitamin D3 1, 25 (OH)2: <10 pg/mL

## 2022-01-12 LAB — TESTOSTERONE: Testosterone: 321 ng/dL (ref 264–916)

## 2022-01-12 LAB — PSA: Prostate Specific Ag, Serum: 2.8 ng/mL (ref 0.0–4.0)

## 2022-01-12 LAB — HEMOGLOBIN A1C
Est. average glucose Bld gHb Est-mCnc: 260 mg/dL
Hgb A1c MFr Bld: 10.7 % — ABNORMAL HIGH (ref 4.8–5.6)

## 2022-01-12 LAB — MAGNESIUM: Magnesium: 2 mg/dL (ref 1.6–2.3)

## 2022-01-29 ENCOUNTER — Other Ambulatory Visit: Payer: Self-pay

## 2022-01-29 ENCOUNTER — Encounter: Payer: Self-pay | Admitting: Internal Medicine

## 2022-01-29 ENCOUNTER — Ambulatory Visit (INDEPENDENT_AMBULATORY_CARE_PROVIDER_SITE_OTHER): Payer: Medicare Other | Admitting: Internal Medicine

## 2022-01-29 VITALS — BP 140/82 | HR 86 | Temp 97.5°F | Ht 73.0 in | Wt 252.5 lb

## 2022-01-29 DIAGNOSIS — E1169 Type 2 diabetes mellitus with other specified complication: Secondary | ICD-10-CM

## 2022-01-29 DIAGNOSIS — E118 Type 2 diabetes mellitus with unspecified complications: Secondary | ICD-10-CM | POA: Diagnosis not present

## 2022-01-29 DIAGNOSIS — I10 Essential (primary) hypertension: Secondary | ICD-10-CM

## 2022-01-29 DIAGNOSIS — E785 Hyperlipidemia, unspecified: Secondary | ICD-10-CM

## 2022-01-29 NOTE — Progress Notes (Signed)
? ?  Subjective:  ? ?Patient ID: Casey Greenspan., male    DOB: Jan 27, 1956, 66 y.o.   MRN: [id number] ? ?HPI ?The patient is a 66 YO man coming in for follow up.  ? ?Review of Systems  ?Constitutional: Negative.   ?HENT: Negative.    ?Eyes: Negative.   ?Respiratory:  Negative for cough, chest tightness and shortness of breath.   ?Cardiovascular:  Negative for chest pain, palpitations and leg swelling.  ?Gastrointestinal:  Negative for abdominal distention, abdominal pain, constipation, diarrhea, nausea and vomiting.  ?Musculoskeletal: Negative.   ?Skin: Negative.   ?Neurological: Negative.   ?Psychiatric/Behavioral: Negative.    ? ?Objective:  ?Physical Exam ?Constitutional:   ?   Appearance: He is well-developed.  ?HENT:  ?   Head: Normocephalic and atraumatic.  ?Cardiovascular:  ?   Rate and Rhythm: Normal rate and regular rhythm.  ?Pulmonary:  ?   Effort: Pulmonary effort is normal. No respiratory distress.  ?   Breath sounds: Normal breath sounds. No wheezing or rales.  ?Abdominal:  ?   General: Bowel sounds are normal. There is no distension.  ?   Palpations: Abdomen is soft.  ?   Tenderness: There is no abdominal tenderness. There is no rebound.  ?Musculoskeletal:  ?   Cervical back: Normal range of motion.  ?Skin: ?   General: Skin is warm and dry.  ?Neurological:  ?   Mental Status: He is alert and oriented to person, place, and time.  ?   Coordination: Coordination normal.  ? ? ?Vitals:  ? 01/29/22 0758 01/29/22 0825  ?BP: (!) 144/80 140/82  ?Pulse: 86   ?Temp: (!) 97.5 ?F (36.4 ?C)   ?TempSrc: Oral   ?SpO2: 94%   ?Weight: 252 lb 8 oz (114.5 kg)   ?Height: 6\' 1"  (1.854 m)   ? ? ?This visit occurred during the SARS-CoV-2 public health emergency.  Safety protocols were in place, including screening questions prior to the visit, additional usage of staff PPE, and extensive cleaning of exam room while observing appropriate contact time as indicated for disinfecting solutions.  ? ?Assessment & Plan:  ? ?

## 2022-01-29 NOTE — Patient Instructions (Addendum)
Work on staying on track with the diet to bring the sugars back down.  ? ?We will have you restart the losartan 25 mg daily for the blood pressure. ? ? ?

## 2022-01-29 NOTE — Assessment & Plan Note (Signed)
He is only taking repatha currently and has stopped lovastatin. He is advised to continue repatha 140 mg every 2 weeks and also resume lovastatin 20 mg daily. Will recheck cholesterol in 2-3 months.  ?

## 2022-01-29 NOTE — Assessment & Plan Note (Signed)
He did stop taking his nifedipine and losartan when he was started on flomax and is no longer taking any BP medication. BP is elevated mildly today and he is asked to resume losartan 25 mg daily and follow up 2-3 months.  ?

## 2022-01-29 NOTE — Assessment & Plan Note (Addendum)
With moderate to severe exacerbation with recent Hga1c 10.7. He was out of medications for about 6 weeks due to changing insurance around January. He is now back on medications and fasting sugar around 100-150 which is at goal. Previously he was at goal. Weight is coming down. He is advised to stay on trulicity 4.5 mg weekly, farxiga 10 mg daily, metformin 1000 mg BID until follow up in 2-3 months for recheck which should be more accurate. Foot exam done today. ?

## 2022-02-11 ENCOUNTER — Other Ambulatory Visit: Payer: Self-pay

## 2022-02-11 ENCOUNTER — Encounter
Payer: Medicare Other | Attending: Physical Medicine and Rehabilitation | Admitting: Physical Medicine and Rehabilitation

## 2022-02-11 ENCOUNTER — Encounter: Payer: Self-pay | Admitting: Physical Medicine and Rehabilitation

## 2022-02-11 VITALS — BP 128/89 | HR 88 | Ht 73.0 in | Wt 253.8 lb

## 2022-02-11 DIAGNOSIS — E118 Type 2 diabetes mellitus with unspecified complications: Secondary | ICD-10-CM | POA: Insufficient documentation

## 2022-02-11 DIAGNOSIS — E559 Vitamin D deficiency, unspecified: Secondary | ICD-10-CM | POA: Insufficient documentation

## 2022-02-11 DIAGNOSIS — E1142 Type 2 diabetes mellitus with diabetic polyneuropathy: Secondary | ICD-10-CM | POA: Diagnosis not present

## 2022-02-11 NOTE — Addendum Note (Signed)
Addended by: Izora Ribas on: 02/11/2022 10:36 AM ? ? Modules accepted: Orders ? ?

## 2022-02-11 NOTE — Patient Instructions (Signed)
Provided with list of supplements that can help with dyslipidemia: 1) Vitamin B3 500-4,000mg in divided doses daily (would recommend starting low as can cause uncomfortable facial flushing if started at too high a dose) 2) Phytosterols 2.15 grams daily 3) Fermented soy 30-50 grams daily 4) EGCG (found in green tea): 500-1000mg daily 5) Omega-3 fatty acids 3000-5,000mg daily 6) Flax seed 40 grams daily 7) Monounsaturated fats 20-40 grams daily (olives, olive oil, nuts), also reduces cardiovascular disease 8) Sesame: 40 grams daily 9) Gamma/delta tocotrienols- a family of unsaturated forms of Vitamin E- 200mg with dinner 10) Pantethine 900mg daily in divided doses 11) Resveratrol 250mg daily 12) N Acetyl Cysteine 2000mg daily in divided doses 13) Curcumin 2000-5000mg in divided doses daily 14) Pomegranate juice: 8 ounces daily, also helps to lower blood pressure 15) Pomegranate seeds one cup daily, also helps to lower blood pressure 16) Citrus Bergamot 1000mg daily, also helps with glucose control and weight loss 17) Vitamin C 500mg daily 18) Quercetin 500-1000mg daily 19) Glutathione 20) Probiotics 60-100 billion organisms per day 21) Fiber 22) Oats 23) Aged garlic (can eat as food or supplement of 600-900mg per day) 24) Chia seeds 25 grams per day 25) Lycopene- carotenoid found in high concentrations in tomatoes. 26) Alpha linolenic acid 27) Flavonoids and anthocyanins 28) Wogonin- flavanoid that enhances reverse cholesterol transport 29) Coenzyme Q10 30) Pantethine- derivative of Vitamin B5: 300mg three times per day or 450mg twice per day with or without food 31) Barley and other whole grains 32) Orange juice 33) L- carnitine 34) L- Lysine 35) L- Arginine 36) Almonds 37) Morin 38) Rutin 39) Carnosine 40) Histidine  41) Kaempferol  42) Organosulfur compounds 43) Vitamin E 44) Oleic acid 45) RBO (ferulic acid gammaoryzanol) 46) grape seed extract 47) Red wine 48)  Berberine HCL 500mg daily or twice per day- more effective and with fewer adverse effects that ezetimibe monotherapy 49) red yeast rice 2400- 4800 mg/day 50) chlorella 51) Licorice  

## 2022-02-11 NOTE — Progress Notes (Signed)
? ?Subjective:  ? ? Patient ID: Casey Hunter., male    DOB: 04/24/1956, 66 y.o.   MRN: [id number] ? ?HPI  ? ?Casey Hunter returns for f/u of LONG COVID symptoms.  ? ?1) Long COVID symptoms ?-left leg still falls asleep- ready to try Qutenza today ?-some nights it can be pretty bad ?-it is not usually painful ?-he is trying to work on his diet. ?-he is taking the Lyrica two at night before sleeping ?-he has taken some ivermectin-was taking it every day when he felt bad.  ?-he asks about taking Pepcid and Claritin ?-does feel much better ?-feels like he is getting enough protein ?-he does not eat much citrus fruits due to his diabetes.  ?-he loves to eat strawberries and blackberries ?-checked testosterone level and discussed that his levels  ?-discussed vitamin D level and high dose supplementation ?-lot of stress since March 1st since his dad felt and broke his hip ? ?2) Diabetic peripheral neuropathy: ?-both feet feel cold ?-His CBGs have been harder to control since COVID ?-discussed his diet thoroughly.  ?-got 60% improvement with Qutenza first time, even better improvement this last time but he can't quantify it.  ?-CBGs have been in 100s ?-restarted Trulicity ?-Ozempic was denied for him.  ?-pain is mostly present on soles of both feet- sometimes in his toes ?-tolerated last session well without any burning ?-discussed his hemoglobin A1c ? ?3) BPH: ?-the tamsulosin has been helping him. ? ?4) Fatigue ?-Contnues to have severe fatigue at times.  ?-yesterday was a bad tay ?-panning to return in August.  ? ?5) HTN: ?-BP is 128/89 ?-has been well controlled ?-now off all blood pressure medications except for flomax 0.4mg .  ?-he has been checking BP at home and it has been stable.  ?-he likes cinnamon ? ?6) Raynaud's syndrome: ?-the nifedipine helped his upper extremity neuropathy but his lower extremities are still very cold at night ?-he continues to take this ? ? ? ?He continues to have numbness in his left  calf. It is better than initially. His left hand will also sleep. It hurts but he is not sure if this is pain. ? ?He has been having increase readings on his Freestyle Libre device- around 200s. For breakfast he eats eggs and sausage. For lunch he eats barebcue and sometimes french fries. Once in a while he eats sugar. He drinks water and unsweet tea and a fruit drink. He eats two bananas every day.  ? ?He has been checking pressures every day and they have been well controlled. He takes his BP medication 5 days per week. ? ?Prior history: ?Dizziness is better, but still present at times. He has brought with him an excellent log of his AM and PM blood pressures, which are excellent off any blood pressure medications.  ? ?Obesity: He has made excellent changes to his diet. He has lost 2 lbs since last visit (currently 266 lbs). He has been eating one banana per day, eggs daily, salmon once per week, meat, and a lot of nuts. He feels he still needs to minimize sweets.  ? ?Energy: Has greatly improved but is still not at the level required for his work. He has weaned off the Ritalin. Discussed goal of restarting work on October 11th for three days per week and he is agreeable.  ? ?Reviewed PCP note and he has been doing much better. HbA1c has improved to 7.7 ? ?Left leg numbness continues but is slightly improved.  ? ? ? ?  Pain Inventory ?Average Pain 4 ?Pain Right Now 7 ?My pain is intermittent, constant, and tingling ? ?In the last 24 hours, has pain interfered with the following? ?General activity 4 ?Relation with others 2 ?Enjoyment of life 5 ?What TIME of day is your pain at its worst? varies ?Sleep (in general) Fair ? ?Pain is worse with: walking, bending, and some activites ?Pain improves with: medication ?Relief from Meds: 5 ? ?Family History  ?Problem Relation Age of Onset  ? Hypertension Mother   ? Hyperlipidemia Mother   ? Hyperlipidemia Father   ? ?Social History  ? ?Socioeconomic History  ? Marital status:  Divorced  ?  Spouse name: Not on file  ? Number of children: 2  ? Years of education: 2912  ? Highest education level: High school graduate  ?Occupational History  ? Not on file  ?Tobacco Use  ? Smoking status: Never  ? Smokeless tobacco: Never  ?Vaping Use  ? Vaping Use: Never used  ?Substance and Sexual Activity  ? Alcohol use: No  ?  Alcohol/week: 0.0 standard drinks  ? Drug use: No  ? Sexual activity: Not Currently  ?Other Topics Concern  ? Not on file  ?Social History Narrative  ? Lives alone  ? ?Social Determinants of Health  ? ?Financial Resource Strain: Not on file  ?Food Insecurity: Not on file  ?Transportation Needs: Not on file  ?Physical Activity: Not on file  ?Stress: Not on file  ?Social Connections: Not on file  ? ?Past Surgical History:  ?Procedure Laterality Date  ? HERNIA REPAIR    ? ?Past Surgical History:  ?Procedure Laterality Date  ? HERNIA REPAIR    ? ?Past Medical History:  ?Diagnosis Date  ? Diabetic peripheral neuropathy associated with type 2 diabetes mellitus 03/14/2017  ? DOE (dyspnea on exertion) 11/2019  ? Onset jan 2021 with covid 19  - assoc with atypical cp developed during the infection present mostly sitting/ absent supine/ better with deep breathing/ no worse with ex - 01/30/2020   Walked RA x two laps =  approx 57300ft @ brisk pace - stopped due to end of study, no sob/ no change cp  with sats of 93 % at the end of the study and no acute ekg changes  - Echo  02/13/2020  wnl with  no pericadial ef  ? Fatigue 11/2019  ? History of COVID-19 11/2019  ? Hyperlipidemia associated with type 2 diabetes mellitus 09/16/2015  ? Hypertension associated with type 2 diabetes mellitus 09/16/2015  ? Knee osteoarthritis 04/14/2016  ? Mild neurocognitive disorder 06/12/2020  ? Numbness of lower extremity 11/2019  ? Attributed to COVID-19  ? Sleep apnea   ? Patient denied being diagnosed with OSA in the past  ? ?BP 128/89   Pulse 88   Ht 6\' 1"  (1.854 m)   Wt 253 lb 12.8 oz (115.1 kg)   SpO2 96%   BMI  33.48 kg/m?  ? ?Opioid Risk Score:   ?Fall Risk Score:  `1 ? ?Depression screen PHQ 2/9 ? ? ?  02/11/2022  ?  8:53 AM 01/29/2022  ?  8:02 AM 10/02/2021  ?  9:33 AM 06/04/2021  ? 10:18 AM 11/17/2020  ? 10:47 AM 08/08/2020  ?  9:59 AM 05/02/2020  ?  9:26 AM  ?Depression screen PHQ 2/9  ?Decreased Interest 0 0 0 0 0 0 2  ?Down, Depressed, Hopeless 0 0 0 0 0 0 0  ?PHQ - 2 Score 0 0 0 0  0 0 2  ?Altered sleeping       0  ?Tired, decreased energy       2  ?Change in appetite       0  ?Feeling bad or failure about yourself        0  ?Trouble concentrating       0  ?Moving slowly or fidgety/restless       1  ?Suicidal thoughts       0  ?PHQ-9 Score       5  ?Difficult doing work/chores       Somewhat difficult  ? ?Review of Systems  ?Constitutional: Negative.   ?HENT: Negative.    ?Eyes: Negative.   ?Respiratory: Negative.    ?Cardiovascular: Negative.   ?Gastrointestinal: Negative.   ?Endocrine: Negative.   ?Genitourinary: Negative.   ?Musculoskeletal:  Positive for gait problem.  ?     Left shoulder pain , right and left knee pain , left leg pain  ?Skin: Negative.   ?Allergic/Immunologic: Negative.   ?Hematological: Negative.   ?Psychiatric/Behavioral: Negative.    ? ?   ?Objective:  ? Physical Exam ?Gen: no distress, normal appearing ?HEENT: oral mucosa pink and moist, NCAT ?Cardio: Reg rate ?Chest: normal effort, normal rate of breathing ?Abd: soft, non-distended ?Ext: no edema ?Psych: pleasant, normal affect ?Skin: bilateral feet have no lesions.  ? ?   ?Assessment & Plan:  ?Mr. Karnes is a 66 year old man who presents for follow-up on LONG COVID symptoms.  ? ?1) HTN: excellent control. Discontinue Losartan ?-continue nifedipine and flomax ?-continue eating a banana every day ?-continue grapefruit daily ?-Advised checking BP daily at home and logging results to bring into follow-up appointment with her PCP and myself. ?-Reviewed BP meds today.  ?-Advised regarding healthy foods that can help lower blood pressure and provided  with a list: ?1) citrus foods- high in vitamins and minerals ?2) salmon and other fatty fish - reduces inflammation and oxylipins ?3) swiss chard (leafy green)- high level of nitrates ?4) pumpkin seeds- one

## 2022-02-12 LAB — VITAMIN D 25 HYDROXY (VIT D DEFICIENCY, FRACTURES): Vit D, 25-Hydroxy: 45 ng/mL (ref 30.0–100.0)

## 2022-03-20 ENCOUNTER — Other Ambulatory Visit: Payer: Self-pay | Admitting: Physical Medicine and Rehabilitation

## 2022-03-20 MED ORDER — FAMOTIDINE 40 MG PO TABS: 80.0000 mg | ORAL_TABLET | Freq: Two times a day (BID) | ORAL | 1 refills | Status: DC

## 2022-03-23 ENCOUNTER — Encounter
Payer: Medicare Other | Attending: Physical Medicine and Rehabilitation | Admitting: Physical Medicine and Rehabilitation

## 2022-03-23 DIAGNOSIS — U071 COVID-19: Secondary | ICD-10-CM | POA: Insufficient documentation

## 2022-03-23 NOTE — Progress Notes (Signed)
? ?Subjective:  ? ? Patient ID: Casey GreenspanJohn H Gene Jr., male    DOB: December 01, 1955, 66 y.o.   MRN: [id number] ? ?HPI  ?An audio/video tele-health visit is felt to be the most appropriate encounter for this patient at this time. This is a follow up tele-visit via phone. The patient is at home. MD is at office. Prior to scheduling this appointment, our staff discussed the limitations of evaluation and management by telemedicine and the availability of in-person appointments. The patient expressed understanding and agreed to proceed.  ? ?Casey Hunter returns for f/u of acute COVID infection ? ?1) Long COVID symptoms ?-left leg still falls asleep- ready to try Qutenza today ?-some nights it can be pretty bad ?-it is not usually painful ?-he is trying to work on his diet. ?-he is taking the Lyrica two at night before sleeping ?-he has taken some ivermectin-was taking it every day when he felt bad.  ?-he asks about taking Pepcid and Claritin ?-does feel much better ?-feels like he is getting enough protein ?-he does not eat much citrus fruits due to his diabetes.  ?-he loves to eat strawberries and blackberries ?-checked testosterone level and discussed that his levels  ?-discussed vitamin D level and high dose supplementation ?-lot of stress since March 1st since his dad felt and broke his hip ? ?2) Diabetic peripheral neuropathy: ?-both feet feel cold ?-His CBGs have been harder to control since COVID ?-discussed his diet thoroughly.  ?-got 60% improvement with Qutenza first time, even better improvement this last time but he can't quantify it.  ?-CBGs have been in 100s ?-restarted Trulicity ?-Ozempic was denied for him.  ?-pain is mostly present on soles of both feet- sometimes in his toes ?-tolerated last session well without any burning ?-discussed his hemoglobin A1c ? ?3) BPH: ?-the tamsulosin has been helping him. ? ?4) Fatigue ?-Contnues to have severe fatigue at times.  ?-yesterday was a bad tay ?-panning to return in  August.  ? ?5) HTN: ?-BP is 128/89 ?-has been well controlled ?-now off all blood pressure medications except for flomax 0.4mg .  ?-he has been checking BP at home and it has been stable.  ?-he likes cinnamon ? ?6) Raynaud's syndrome: ?-the nifedipine helped his upper extremity neuropathy but his lower extremities are still very cold at night ?-he continues to take this ? ?7) Acute COVID infection ?-he asks why he is getting these infections every year ?-he is feeling better than when he called me on Saturday ?-he asks for how long he should quarantine.  ? ? ? ?He continues to have numbness in his left calf. It is better than initially. His left hand will also sleep. It hurts but he is not sure if this is pain. ? ?He has been having increase readings on his Freestyle Libre device- around 200s. For breakfast he eats eggs and sausage. For lunch he eats barebcue and sometimes french fries. Once in a while he eats sugar. He drinks water and unsweet tea and a fruit drink. He eats two bananas every day.  ? ?He has been checking pressures every day and they have been well controlled. He takes his BP medication 5 days per week. ? ?Prior history: ?Dizziness is better, but still present at times. He has brought with him an excellent log of his AM and PM blood pressures, which are excellent off any blood pressure medications.  ? ?Obesity: He has made excellent changes to his diet. He has lost 2 lbs since  last visit (currently 266 lbs). He has been eating one banana per day, eggs daily, salmon once per week, meat, and a lot of nuts. He feels he still needs to minimize sweets.  ? ?Energy: Has greatly improved but is still not at the level required for his work. He has weaned off the Ritalin. Discussed goal of restarting work on October 11th for three days per week and he is agreeable.  ? ?Reviewed PCP note and he has been doing much better. HbA1c has improved to 7.7 ? ?Left leg numbness continues but is slightly improved.   ? ? ? ?Pain Inventory ?Average Pain 4 ?Pain Right Now 7 ?My pain is intermittent, constant, and tingling ? ?In the last 24 hours, has pain interfered with the following? ?General activity 4 ?Relation with others 2 ?Enjoyment of life 5 ?What TIME of day is your pain at its worst? varies ?Sleep (in general) Fair ? ?Pain is worse with: walking, bending, and some activites ?Pain improves with: medication ?Relief from Meds: 5 ? ?Family History  ?Problem Relation Age of Onset  ? Hypertension Mother   ? Hyperlipidemia Mother   ? Hyperlipidemia Father   ? ?Social History  ? ?Socioeconomic History  ? Marital status: Divorced  ?  Spouse name: Not on file  ? Number of children: 2  ? Years of education: 67  ? Highest education level: High school graduate  ?Occupational History  ? Not on file  ?Tobacco Use  ? Smoking status: Never  ? Smokeless tobacco: Never  ?Vaping Use  ? Vaping Use: Never used  ?Substance and Sexual Activity  ? Alcohol use: No  ?  Alcohol/week: 0.0 standard drinks  ? Drug use: No  ? Sexual activity: Not Currently  ?Other Topics Concern  ? Not on file  ?Social History Narrative  ? Lives alone  ? ?Social Determinants of Health  ? ?Financial Resource Strain: Not on file  ?Food Insecurity: Not on file  ?Transportation Needs: Not on file  ?Physical Activity: Not on file  ?Stress: Not on file  ?Social Connections: Not on file  ? ?Past Surgical History:  ?Procedure Laterality Date  ? HERNIA REPAIR    ? ?Past Surgical History:  ?Procedure Laterality Date  ? HERNIA REPAIR    ? ?Past Medical History:  ?Diagnosis Date  ? Diabetic peripheral neuropathy associated with type 2 diabetes mellitus 03/14/2017  ? DOE (dyspnea on exertion) 11/2019  ? Onset jan 2021 with covid 19  - assoc with atypical cp developed during the infection present mostly sitting/ absent supine/ better with deep breathing/ no worse with ex - 01/30/2020   Walked RA x two laps =  approx 570ft @ brisk pace - stopped due to end of study, no sob/ no change  cp  with sats of 93 % at the end of the study and no acute ekg changes  - Echo  02/13/2020  wnl with  no pericadial ef  ? Fatigue 11/2019  ? History of COVID-19 11/2019  ? Hyperlipidemia associated with type 2 diabetes mellitus 09/16/2015  ? Hypertension associated with type 2 diabetes mellitus 09/16/2015  ? Knee osteoarthritis 04/14/2016  ? Mild neurocognitive disorder 06/12/2020  ? Numbness of lower extremity 11/2019  ? Attributed to COVID-19  ? Sleep apnea   ? Patient denied being diagnosed with OSA in the past  ? ?There were no vitals taken for this visit. ? ?Opioid Risk Score:   ?Fall Risk Score:  `1 ? ?Depression screen PHQ 2/9 ? ? ?  02/11/2022  ?  8:53 AM 01/29/2022  ?  8:02 AM 10/02/2021  ?  9:33 AM 06/04/2021  ? 10:18 AM 11/17/2020  ? 10:47 AM 08/08/2020  ?  9:59 AM 05/02/2020  ?  9:26 AM  ?Depression screen PHQ 2/9  ?Decreased Interest 0 0 0 0 0 0 2  ?Down, Depressed, Hopeless 0 0 0 0 0 0 0  ?PHQ - 2 Score 0 0 0 0 0 0 2  ?Altered sleeping       0  ?Tired, decreased energy       2  ?Change in appetite       0  ?Feeling bad or failure about yourself        0  ?Trouble concentrating       0  ?Moving slowly or fidgety/restless       1  ?Suicidal thoughts       0  ?PHQ-9 Score       5  ?Difficult doing work/chores       Somewhat difficult  ? ?Review of Systems  ?Constitutional: Negative.   ?HENT: Negative.    ?Eyes: Negative.   ?Respiratory: Negative.    ?Cardiovascular: Negative.   ?Gastrointestinal: Negative.   ?Endocrine: Negative.   ?Genitourinary: Negative.   ?Musculoskeletal:  Positive for gait problem.  ?     Left shoulder pain , right and left knee pain , left leg pain  ?Skin: Negative.   ?Allergic/Immunologic: Negative.   ?Hematological: Negative.   ?Psychiatric/Behavioral: Negative.    ? ?   ?Objective:  ? Physical Exam ?Not performed ? ?   ?Assessment & Plan:  ?Casey Hunter is a 66 year old man who presents for follow-up on LONG COVID symptoms.  ? ?1) HTN: excellent control. Discontinue Losartan ?-continue  nifedipine and flomax ?-continue eating a banana every day ?-continue grapefruit daily ?-Advised checking BP daily at home and logging results to bring into follow-up appointment with her PCP and myself. ?-Reviewe

## 2022-04-05 ENCOUNTER — Other Ambulatory Visit: Payer: Self-pay | Admitting: Physical Medicine and Rehabilitation

## 2022-04-05 DIAGNOSIS — M170 Bilateral primary osteoarthritis of knee: Secondary | ICD-10-CM

## 2022-04-27 ENCOUNTER — Other Ambulatory Visit: Payer: Self-pay | Admitting: Physical Medicine and Rehabilitation

## 2022-04-29 ENCOUNTER — Other Ambulatory Visit: Payer: Self-pay | Admitting: Physical Medicine and Rehabilitation

## 2022-05-29 ENCOUNTER — Other Ambulatory Visit: Payer: Self-pay | Admitting: Internal Medicine

## 2022-05-29 DIAGNOSIS — E1169 Type 2 diabetes mellitus with other specified complication: Secondary | ICD-10-CM

## 2022-07-23 NOTE — Telephone Encounter (Signed)
error 

## 2022-07-28 ENCOUNTER — Telehealth: Payer: Self-pay | Admitting: Physical Medicine and Rehabilitation

## 2022-07-28 NOTE — Telephone Encounter (Signed)
Patient returned your phone call.  Please call him at [phone].

## 2022-07-28 NOTE — Telephone Encounter (Signed)
Thank you. I spoke with Casey Hunter.

## 2022-08-03 ENCOUNTER — Telehealth: Payer: Self-pay

## 2022-08-03 NOTE — Telephone Encounter (Signed)
Per Dr. Carlis Abbott Repatha Sure Click will need to be handled by Mr. Micalizzi's PCP. Patient has been informed. He will see her this Friday 08/06/2022.

## 2022-08-06 ENCOUNTER — Ambulatory Visit (INDEPENDENT_AMBULATORY_CARE_PROVIDER_SITE_OTHER): Payer: Medicare Other | Admitting: Internal Medicine

## 2022-08-06 ENCOUNTER — Encounter: Payer: Self-pay | Admitting: Internal Medicine

## 2022-08-06 VITALS — BP 138/66 | HR 67 | Ht 73.0 in | Wt 246.0 lb

## 2022-08-06 DIAGNOSIS — E118 Type 2 diabetes mellitus with unspecified complications: Secondary | ICD-10-CM | POA: Diagnosis not present

## 2022-08-06 LAB — POCT GLYCOSYLATED HEMOGLOBIN (HGB A1C): Hemoglobin A1C: 7.8 % — AB (ref 4.0–5.6)

## 2022-08-06 MED ORDER — TRULICITY 4.5 MG/0.5ML ~~LOC~~ SOAJ: 4.5000 mg | SUBCUTANEOUS | 3 refills | Status: DC

## 2022-08-06 MED ORDER — FREESTYLE LIBRE 2 SENSOR MISC: 1.0000 [IU] | Freq: Every day | 11 refills | Status: DC

## 2022-08-06 NOTE — Progress Notes (Signed)
   Subjective:   Patient ID: Casey Hunter., male    DOB: August 21, 1956, 66 y.o.   MRN: [id number]  HPI The patient is a 66 YO man coming in for follow up.  Review of Systems  Constitutional: Negative.   HENT: Negative.    Eyes: Negative.   Respiratory:  Negative for cough, chest tightness and shortness of breath.   Cardiovascular:  Negative for chest pain, palpitations and leg swelling.  Gastrointestinal:  Negative for abdominal distention, abdominal pain, constipation, diarrhea, nausea and vomiting.  Musculoskeletal: Negative.   Skin: Negative.   Neurological: Negative.   Psychiatric/Behavioral: Negative.      Objective:  Physical Exam Constitutional:      Appearance: He is well-developed.  HENT:     Head: Normocephalic and atraumatic.  Cardiovascular:     Rate and Rhythm: Normal rate and regular rhythm.  Pulmonary:     Effort: Pulmonary effort is normal. No respiratory distress.     Breath sounds: Normal breath sounds. No wheezing or rales.  Abdominal:     General: Bowel sounds are normal. There is no distension.     Palpations: Abdomen is soft.     Tenderness: There is no abdominal tenderness. There is no rebound.  Musculoskeletal:     Cervical back: Normal range of motion.  Skin:    General: Skin is warm and dry.  Neurological:     Mental Status: He is alert and oriented to person, place, and time.     Coordination: Coordination normal.     Vitals:   08/06/22 0841  BP: 138/66  Pulse: 67  SpO2: 95%  Weight: 246 lb (111.6 kg)  Height: 6\' 1"  (1.854 m)    Assessment & Plan:

## 2022-08-06 NOTE — Assessment & Plan Note (Signed)
POC HgA1c done today and back to goal 7.8 on trulicity, farxiga, metformin. He is currently off metformin due to low sugars and uses cgm to monitor and adjust regimen. Eating is much better and down 10 pounds in the last 6 months. Follow up 6 months.

## 2022-08-06 NOTE — Patient Instructions (Signed)
Your HgA1c is 7.8 today which is better.

## 2022-08-10 ENCOUNTER — Telehealth: Payer: Self-pay | Admitting: Internal Medicine

## 2022-08-10 DIAGNOSIS — E1169 Type 2 diabetes mellitus with other specified complication: Secondary | ICD-10-CM

## 2022-08-10 NOTE — Telephone Encounter (Signed)
Patient needs his repatha sent to [facility]

## 2022-08-10 NOTE — Telephone Encounter (Signed)
Last refill-11/2021 by Dr. Ranell Patrick. Please advise

## 2022-08-11 MED ORDER — REPATHA SURECLICK 140 MG/ML ~~LOC~~ SOAJ: 140.0000 mg | SUBCUTANEOUS | 3 refills | Status: DC

## 2022-08-11 NOTE — Telephone Encounter (Signed)
Appeal form-completed and faxed to [phone]. [phone]

## 2022-08-11 NOTE — Telephone Encounter (Signed)
Patient dropped off appeal form to be filled out so repatha can be covered

## 2022-08-19 ENCOUNTER — Other Ambulatory Visit: Payer: Self-pay

## 2022-08-19 DIAGNOSIS — E1142 Type 2 diabetes mellitus with diabetic polyneuropathy: Secondary | ICD-10-CM

## 2022-08-19 MED ORDER — QUTENZA (4 PATCH) 8 % EX KIT: 4.0000 | PACK | Freq: Once | CUTANEOUS | 0 refills | Status: AC

## 2022-08-19 MED ORDER — CAPSAICIN-CLEANSING GEL 8 % EX KIT: 4.0000 | PACK | Freq: Once | CUTANEOUS | Status: DC

## 2022-08-24 NOTE — Progress Notes (Unsigned)
Subjective:    Patient ID: Casey Spiro., male    DOB: [birth year], 66 y.o.   MRN: [id number]  HPI  An audio/video tele-health visit is felt to be the most appropriate encounter for this patient at this time. This is a follow up tele-visit via phone. The patient is at home. MD is at office. Prior to scheduling this appointment, our staff discussed the limitations of evaluation and management by telemedicine and the availability of in-person appointments. The patient expressed understanding and agreed to proceed.   Mr. Edmundson returns for f/u of acute COVID infection  1) Long COVID symptoms -left leg still falls asleep- ready to try Qutenza today -some nights it can be pretty bad -it is not usually painful -he is trying to work on his diet. -he is taking the Lyrica two at night before sleeping -he has taken some ivermectin-was taking it every day when he felt bad.  -he asks about taking Pepcid and Claritin -does feel much better -feels like he is getting enough protein -he does not eat much citrus fruits due to his diabetes.  -he loves to eat strawberries and blackberries -checked testosterone level and discussed that his levels  -discussed vitamin D level and high dose supplementation -lot of stress since March 1st since his dad felt and broke his hip  2) Diabetic peripheral neuropathy: -both feet feel cold -His CBGs have been harder to control since COVID -discussed his diet thoroughly.  -got 60% improvement with Qutenza first time, even better improvement this last time but he can't quantify it.  -CBGs have been in 627O -restarted Trulicity -Ozempic was denied for him.  -pain is mostly present on soles of both feet- sometimes in his toes -tolerated last session well without any burning -discussed his hemoglobin A1c  3) BPH: -the tamsulosin has been helping him.  4) Fatigue -Contnues to have severe fatigue at times.  -yesterday was a bad tay -panning to return in  August.   5) HTN: -BP is 128/89 -has been well controlled -now off all blood pressure medications except for flomax 0.4mg .  -he has been checking BP at home and it has been stable.  -he likes cinnamon  6) Raynaud's syndrome: -the nifedipine helped his upper extremity neuropathy but his lower extremities are still very cold at night -he continues to take this  7) Acute COVID infection -he asks why he is getting these infections every year -he is feeling better than when he called me on Saturday -he asks for how long he should quarantine.     He continues to have numbness in his left calf. It is better than initially. His left hand will also sleep. It hurts but he is not sure if this is pain.  He has been having increase readings on his Freestyle Libre device- around 200s. For breakfast he eats eggs and sausage. For lunch he eats barebcue and sometimes french fries. Once in a while he eats sugar. He drinks water and unsweet tea and a fruit drink. He eats two bananas every day.   He has been checking pressures every day and they have been well controlled. He takes his BP medication 5 days per week.  Prior history: Dizziness is better, but still present at times. He has brought with him an excellent log of his AM and PM blood pressures, which are excellent off any blood pressure medications.   Obesity: He has made excellent changes to his diet. He has lost 2 lbs since  last visit (currently 266 lbs). He has been eating one banana per day, eggs daily, salmon once per week, meat, and a lot of nuts. He feels he still needs to minimize sweets.   Energy: Has greatly improved but is still not at the level required for his work. He has weaned off the Ritalin. Discussed goal of restarting work on October 11th for three days per week and he is agreeable.   Reviewed PCP note and he has been doing much better. HbA1c has improved to 7.7  Left leg numbness continues but is slightly improved.      Pain Inventory Average Pain 4 Pain Right Now 7 My pain is intermittent, constant, and tingling  In the last 24 hours, has pain interfered with the following? General activity 4 Relation with others 2 Enjoyment of life 5 What TIME of day is your pain at its worst? varies Sleep (in general) Fair  Pain is worse with: walking, bending, and some activites Pain improves with: medication Relief from Meds: 5  Family History  Problem Relation Age of Onset  . Hypertension Mother   . Hyperlipidemia Mother   . Hyperlipidemia Father    Social History   Socioeconomic History  . Marital status: Divorced    Spouse name: Not on file  . Number of children: 2  . Years of education: 6  . Highest education level: High school graduate  Occupational History  . Not on file  Tobacco Use  . Smoking status: Never  . Smokeless tobacco: Never  Vaping Use  . Vaping Use: Never used  Substance and Sexual Activity  . Alcohol use: No    Alcohol/week: 0.0 standard drinks of alcohol  . Drug use: No  . Sexual activity: Not Currently  Other Topics Concern  . Not on file  Social History Narrative   Lives alone   Social Determinants of Health   Financial Resource Strain: Not on file  Food Insecurity: Not on file  Transportation Needs: Not on file  Physical Activity: Not on file  Stress: Not on file  Social Connections: Not on file   Past Surgical History:  Procedure Laterality Date  . HERNIA REPAIR     Past Surgical History:  Procedure Laterality Date  . HERNIA REPAIR     Past Medical History:  Diagnosis Date  . Diabetic peripheral neuropathy associated with type 2 diabetes mellitus 03/14/2017  . DOE (dyspnea on exertion) 11/2019   Onset jan 2021 with covid 19  - assoc with atypical cp developed during the infection present mostly sitting/ absent supine/ better with deep breathing/ no worse with ex - 01/30/2020   Walked RA x two laps =  approx 557ft @ brisk pace - stopped due to  end of study, no sob/ no change cp  with sats of 93 % at the end of the study and no acute ekg changes  - Echo  02/13/2020  wnl with  no pericadial ef  . Fatigue 11/2019  . History of COVID-19 11/2019  . Hyperlipidemia associated with type 2 diabetes mellitus 09/16/2015  . Hypertension associated with type 2 diabetes mellitus 09/16/2015  . Knee osteoarthritis 04/14/2016  . Mild neurocognitive disorder 06/12/2020  . Numbness of lower extremity 11/2019   Attributed to COVID-19  . Sleep apnea    Patient denied being diagnosed with OSA in the past   There were no vitals taken for this visit.  Opioid Risk Score:   Fall Risk Score:  `1  Depression screen  PHQ 2/9     02/11/2022    8:53 AM 01/29/2022    8:02 AM 10/02/2021    9:33 AM 06/04/2021   10:18 AM 11/17/2020   10:47 AM 08/08/2020    9:59 AM 05/02/2020    9:26 AM  Depression screen PHQ 2/9  Decreased Interest 0 0 0 0 0 0 2  Down, Depressed, Hopeless 0 0 0 0 0 0 0  PHQ - 2 Score 0 0 0 0 0 0 2  Altered sleeping       0  Tired, decreased energy       2  Change in appetite       0  Feeling bad or failure about yourself        0  Trouble concentrating       0  Moving slowly or fidgety/restless       1  Suicidal thoughts       0  PHQ-9 Score       5  Difficult doing work/chores       Somewhat difficult   Review of Systems  Constitutional: Negative.   HENT: Negative.    Eyes: Negative.   Respiratory: Negative.    Cardiovascular: Negative.   Gastrointestinal: Negative.   Endocrine: Negative.   Genitourinary: Negative.   Musculoskeletal:  Positive for gait problem.       Left shoulder pain , right and left knee pain , left leg pain  Skin: Negative.   Allergic/Immunologic: Negative.   Hematological: Negative.   Psychiatric/Behavioral: Negative.        Objective:   Physical Exam Not performed     Assessment & Plan:  [name] is a 66 year old man who presents for follow-up on LONG COVID symptoms.   1) HTN: excellent  control. Discontinue Losartan -continue nifedipine and flomax -continue eating a banana every day -continue grapefruit daily -Advised checking BP daily at home and logging results to bring into follow-up appointment with her PCP and myself. -Reviewed BP meds today.  -Advised regarding healthy foods that can help lower blood pressure and provided with a list: 1) citrus foods- high in vitamins and minerals 2) salmon and other fatty fish - reduces inflammation and oxylipins 3) swiss chard (leafy green)- high level of nitrates 4) pumpkin seeds- one of the best natural sources of magnesium 5) Beans and lentils- high in fiber, magnesium, and potassium 6) Berries- high in flavonoids 7) Amaranth (whole grain, can be cooked similarly to rice and oats)- high in magnesium and fiber 8) Pistachios- even more effective at reducing BP than other nuts 9) Carrots- high in phenolic compounds that relax blood vessels and reduce inflammation 10) Celery- contain phthalides that relax tissues of arterial walls 11) Tomatoes- can also improve cholesterol and reduce risk of heart disease 12) Broccoli- good source of magnesium, calcium, and potassium 13) Greek yogurt: high in potassium and calcium 14) Herbs and spices: Celery seed, cilantro, saffron, lemongrass, black cumin, ginseng, cinnamon, cardamom, sweet basil, and ginger 15) Chia and flax seeds- also help to lower cholesterol and blood sugar 16) Beets- high levels of nitrates that relax blood vessels  17) spinach and bananas- high in potassium  -Provided lise of supplements that can help with hypertension:  1) magnesium: one high quality brand is Bioptemizers since it contains all 7 types of magnesium, otherwise over the counter magnesium gluconate 400mg  is a good option 2) B vitamins 3) vitamin D 4) potassium 5) CoQ10 6) L-arginine 7) Vitamin C  8) Beetroot -Educated that goal BP is 120/80. -Made goal to incorporate some of the above foods into diet.      2) Fatigue: Improved but still present. Continue NAC 600mg  BID and gave list of foods that increase NAC. Off Ritalin. Increase work to 4 days per week until re-eval on April 1st. Provided with note. This is one of the reason he feels he needs to retire.   3) Vitamin D deficiency -prescribed ergocalciferol 50,000U once per week for 7 weeks  4) Leg numbness: Persists, but improved. Explained etiologies of nerve injury 2/2 COVID-19 vs. Diabetic neuropathy.   5) Obesity: Weight 253- lost 17 lbs since August!!- He eats salmon once per week, eggs daily, nuts, meat, olive oil. Made goal to avoid sugar. Discussed benefits of intermittent fasting. -Educated regarding health benefits of weight loss- for pain, general health, chronic disease prevention, immune health, mental health.  -Will monitor weight every visit.  -Consider [place] tea daily.  -Discussed the benefits of intermittent fasting. -Discussed foods that can assist in weight loss: 1) leafy greens- high in fiber and nutrients 2) dark chocolate- improves metabolism (if prefer sweetened, best to sweeten with honey instead of sugar).  3) cruciferous vegetables- high in fiber and protein 4) full fat yogurt: high in healthy fat, protein, calcium, and probiotics 5) apples- high in a variety of phytochemicals 6) nuts- high in fiber and protein that increase feelings of fullness 7) grapefruit: rich in nutrients, antioxidants, and fiber (not to be taken with anticoagulation) 8) beans- high in protein and fiber 9) salmon- has high quality protein and healthy fats 10) green tea- rich in polyphenols 11) eggs- rich in choline and vitamin D 12) tuna- high protein, boosts metabolism 13) avocado- decreases visceral abdominal fat 14) chicken (pasture raised): high in protein and iron 15) blueberries- reduce abdominal fat and cholesterol 16) whole grains- decreases calories retained during digestion, speeds metabolism 17) chia seeds- curb  appetite 18) chilies- increases fat metabolism   6) DM 2: HgbA1c improving.  PCP note reviewed- overall improved. Last checked about a month ago.  -check CBGs daily, log, and bring log to follow-up appointment -avoid sugar, bread, pasta, rice -avoid snacking -Recommended 1 glass water with 1 TB apple cider vinegar before meals to reduce CBG spike, has additional health benefits, drink with straw to protect enamel.   -prescribed Trulicity -perform daily foot exam and at least annual eye exam -try to incorporate into your diet some of the following foods which are good for diabetes: 1) cinnamon- imitates effects of insulin, increasing glucose transport into cells (September or [place] cinnamon is best, least processed) 2) nuts- can slow down the blood sugar response of carbohydrate rich foods 3) oatmeal- contains and anti-inflammatory compound avenanthramide 4) whole-milk yogurt (best types are no sugar, [place] yogurt, or goat/sheep yogurt) 5) beans- high in protein, fiber, and vitamins, low glycemic index 6) broccoli- great source of vitamin A and C 7) quinoa- higher in protein and fiber than other grains 8) spinach- high in vitamin A, fiber, and protein 9) olive oil- reduces glucose levels, LDL, and triglycerides 10) salmon- excellent amount of omega-3-fatty acids 11) walnuts- rich in antioxidants 12) apples- high in fiber and quercetin 13) carrots- highly nutritious with low impact on blood sugar 14) eggs- improve HDL (good cholesterol), high in protein, keep you satiated 15) turmeric: improves blood sugars, cardiovascular disease, and protects kidney health 16) garlic: improves blood sugar, blood pressure, pain 17) tomatoes: highly nutritious with low impact on blood  sugar   7) HLD -really improved on last labs! -continue Rapatha -repeat lipid panel with January labs  8) Diabetic peripheral neuropathy -Discussed Qutenza as an option for neuropathic pain control. Discussed that  this is a capsaicin patch, stronger than capsaicin cream. Discussed that it is currently approved for diabetic peripheral neuropathy and post-herpetic neuralgia, but that it has also shown benefit in treating other forms of neuropathy. Provided patient with link to site to learn more about the patch: https://www.clark.biz/. Discussed that the patch would be placed in office and benefits usually last 3 months. Discussed that unintended exposure to capsaicin can cause severe irritation of eyes, mucous membranes, respiratory tract, and skin, but that Qutenza is a local treatment and does not have the systemic side effects of other nerve medications. Discussed that there may be pain, itching, erythema, and decreased sensory function associated with the application of Qutenza. Side effects usually subside within 1 week. A cold pack of analgesic medications can help with these side effects. Blood pressure can also be increased due to pain associated with administration of the patch.   4 patches of Qutenza was applied to the area of pain. Ice packs were applied during the procedure to ensure patient comfort. Blood pressure was monitored every 15 minutes. The patient tolerated the procedure well. Post-procedure instructions were given and follow-up has been scheduled.    9) BPH: -continue flomax to 0.8mg  HS    10) Decreased muscle mass, brain fog, decreased strength: -check testosterone and estrone  11) Elevated PSA -normalized on 11/22 labs- discussed with Mr. 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  -discussed that this has normalized.   13) Acute COVID infection -prescribed pepcid on Saturday and patient is now feeling better -discussed at least 5 day quarantine and until symptoms resolve -discussed his ivermectin use -discussed foods rich in vitamin C and zinc to help boost immune system to reduce severe  infections in the future  General health: He has received Shingles and pneumococcal vaccines, early flu shot, and 2nd COVID booster.   12 minutes spent in discussion of his acute infection, discussed positive response to pepcid, recommended to stop medication once symptoms have resolved, provided dietary education to reduce risk of severe infection

## 2022-08-26 ENCOUNTER — Encounter
Payer: Medicare Other | Attending: Physical Medicine and Rehabilitation | Admitting: Physical Medicine and Rehabilitation

## 2022-08-26 ENCOUNTER — Encounter: Payer: Self-pay | Admitting: Physical Medicine and Rehabilitation

## 2022-08-26 VITALS — BP 155/94 | HR 77 | Ht 73.0 in | Wt 248.0 lb

## 2022-08-26 DIAGNOSIS — E349 Endocrine disorder, unspecified: Secondary | ICD-10-CM | POA: Diagnosis present

## 2022-08-26 DIAGNOSIS — E559 Vitamin D deficiency, unspecified: Secondary | ICD-10-CM | POA: Insufficient documentation

## 2022-08-26 DIAGNOSIS — R351 Nocturia: Secondary | ICD-10-CM | POA: Diagnosis present

## 2022-08-26 DIAGNOSIS — E1142 Type 2 diabetes mellitus with diabetic polyneuropathy: Secondary | ICD-10-CM | POA: Insufficient documentation

## 2022-08-26 DIAGNOSIS — N401 Enlarged prostate with lower urinary tract symptoms: Secondary | ICD-10-CM | POA: Insufficient documentation

## 2022-08-26 DIAGNOSIS — R972 Elevated prostate specific antigen [PSA]: Secondary | ICD-10-CM | POA: Diagnosis present

## 2022-08-26 DIAGNOSIS — E1169 Type 2 diabetes mellitus with other specified complication: Secondary | ICD-10-CM | POA: Insufficient documentation

## 2022-08-26 DIAGNOSIS — H259 Unspecified age-related cataract: Secondary | ICD-10-CM | POA: Insufficient documentation

## 2022-08-26 DIAGNOSIS — E785 Hyperlipidemia, unspecified: Secondary | ICD-10-CM | POA: Diagnosis not present

## 2022-08-26 MED ORDER — TAMSULOSIN HCL 0.4 MG PO CAPS: 0.8000 mg | ORAL_CAPSULE | Freq: Every day | ORAL | 3 refills | Status: DC

## 2022-08-26 MED ORDER — PREGABALIN 75 MG PO CAPS: 75.0000 mg | ORAL_CAPSULE | Freq: Three times a day (TID) | ORAL | 3 refills | Status: DC

## 2022-08-26 MED ORDER — REPATHA SURECLICK 140 MG/ML ~~LOC~~ SOAJ: 140.0000 mg | SUBCUTANEOUS | 3 refills | Status: DC

## 2022-08-26 MED ORDER — DAPAGLIFLOZIN PROPANEDIOL 10 MG PO TABS: 10.0000 mg | ORAL_TABLET | Freq: Every day | ORAL | 3 refills | Status: DC

## 2022-08-26 MED ORDER — CAPSAICIN-CLEANSING GEL 8 % EX KIT
4.0000 | PACK | Freq: Once | CUTANEOUS | Status: AC
  Administered 2022-08-26: 4 via TOPICAL

## 2022-08-26 NOTE — Addendum Note (Signed)
Addended by: Izora Ribas on: 08/26/2022 11:19 AM   Modules accepted: Orders

## 2022-08-27 ENCOUNTER — Encounter (HOSPITAL_BASED_OUTPATIENT_CLINIC_OR_DEPARTMENT_OTHER): Payer: Medicare Other | Admitting: Physical Medicine and Rehabilitation

## 2022-08-27 DIAGNOSIS — E785 Hyperlipidemia, unspecified: Secondary | ICD-10-CM | POA: Diagnosis not present

## 2022-08-27 DIAGNOSIS — E1169 Type 2 diabetes mellitus with other specified complication: Secondary | ICD-10-CM

## 2022-08-27 LAB — TESTOSTERONE: Testosterone: 420 ng/dL (ref 264–916)

## 2022-08-27 LAB — LIPID PANEL W/O CHOL/HDL RATIO
Cholesterol, Total: 165 mg/dL (ref 100–199)
HDL: 47 mg/dL (ref >39)
LDL Chol Calc (NIH): 89 mg/dL (ref 0–99)
Triglycerides: 168 mg/dL — ABNORMAL HIGH (ref 0–149)
VLDL Cholesterol Cal: 29 mg/dL (ref 5–40)

## 2022-08-27 LAB — VITAMIN D 25 HYDROXY (VIT D DEFICIENCY, FRACTURES): Vit D, 25-Hydroxy: 47.8 ng/mL (ref 30.0–100.0)

## 2022-08-27 LAB — PSA: Prostate Specific Ag, Serum: 2.5 ng/mL (ref 0.0–4.0)

## 2022-08-27 MED ORDER — VITAMIN D (ERGOCALCIFEROL) 1.25 MG (50000 UNIT) PO CAPS: 50000.0000 [IU] | ORAL_CAPSULE | ORAL | 3 refills | Status: DC

## 2022-08-27 NOTE — Progress Notes (Signed)
Subjective:    Patient ID: Casey Spiro., male    DOB: 09/24/1956, 66 y.o.   MRN: [id number]  HPI  An audio/video tele-health visit is felt to be the most appropriate encounter for this patient at this time. This is a follow up tele-visit via phone. The patient is at home. MD is at office. Prior to scheduling this appointment, our staff discussed the limitations of evaluation and management by telemedicine and the availability of in-person appointments. The patient expressed understanding and agreed to proceed.   Casey Hunter returns for f/u LONG COVID, Vitamin D deficiency, and HLD  1) Long COVID symptoms -left leg still falls asleep- ready to try Qutenza today -some nights it can be pretty bad -it is not usually painful -he is trying to work on his diet. -he is taking the Lyrica two at night before sleeping -he has taken some ivermectin-was taking it every day when he felt bad.  -he asks about taking Pepcid and Claritin -does feel much better -feels like he is getting enough protein -he does not eat much citrus fruits due to his diabetes.  -he loves to eat strawberries and blackberries -checked testosterone level and discussed that his levels  -discussed vitamin D level and high dose supplementation -lot of stress since March 1st since his dad felt and broke his hip  2) Diabetic peripheral neuropathy: -both feet feel cold -hemoglobin A1c decreased to 7 -CBGs better controlled- less than 150s -eating 1 egg at breakfast and drinks unsweet tea -weight is 248 lbs today -discussed his diet thoroughly.  -got 60% improvement with Qutenza first time, even better improvement this last time but he can't quantify it.  -restarted Trulicity -Ozempic was denied for him.  -pain is mostly present on soles of both feet- sometimes in his toes -tolerated last session well without any burning -discussed his hemoglobin A1c  3) BPH: -the tamsulosin has been helping him. -only urinating  twice per night  4) Fatigue -Contnues to have severe fatigue at times.  -yesterday was a bad tay -panning to return in August.   5) HTN: -BP is 155/94 -has been well controlled -now off all blood pressure medications except for flomax 0.4mg .  -he has been checking BP at home and it has been stable.  -he likes cinnamon  6) Raynaud's syndrome: -the nifedipine helped his upper extremity neuropathy but his lower extremities are still very cold at night -he continues to take this  7) History of multiple acute COVID infections -he asks why he is getting these infections every year -he is feeling better than when he called me on Saturday -he asks for how long he should quarantine.   8) Vitamin D deficiency -discussed that level has improved to 40s, and has remained stable there since we checked 4 months ago -discussed that this was an improvement from the first time we checked -prescribed refill for 20 weeks of weekly high dose supplement  9) elevated triglycerides -recommended avoiding processed fodos    He continues to have numbness in his left calf. It is better than initially. His left hand will also sleep. It hurts but he is not sure if this is pain.  He has been having increase readings on his Freestyle Libre device- around 200s. For breakfast he eats eggs and sausage. For lunch he eats barebcue and sometimes french fries. Once in a while he eats sugar. He drinks water and unsweet tea and a fruit drink. He eats two bananas every day.  He has been checking pressures every day and they have been well controlled. He takes his BP medication 5 days per week.  Prior history: Dizziness is better, but still present at times. He has brought with him an excellent log of his AM and PM blood pressures, which are excellent off any blood pressure medications.   Obesity: He has made excellent changes to his diet. He has lost 2 lbs since last visit (currently 266 lbs). He has been eating  one banana per day, eggs daily, salmon once per week, meat, and a lot of nuts. He feels he still needs to minimize sweets.   Energy: Has greatly improved but is still not at the level required for his work. He has weaned off the Ritalin. Discussed goal of restarting work on October 11th for three days per week and he is agreeable.   Reviewed PCP note and he has been doing much better. HbA1c has improved to 7.7  Left leg numbness continues but is slightly improved.     Pain Inventory Average Pain 4 Pain Right Now 7 My pain is intermittent, constant, and tingling  In the last 24 hours, has pain interfered with the following? General activity 4 Relation with others 2 Enjoyment of life 5 What TIME of day is your pain at its worst? varies Sleep (in general) Fair  Pain is worse with: walking, bending, and some activites Pain improves with: medication Relief from Meds: 5  Family History  Problem Relation Age of Onset   Hypertension Mother    Hyperlipidemia Mother    Hyperlipidemia Father    Social History   Socioeconomic History   Marital status: Divorced    Spouse name: Not on file   Number of children: 2   Years of education: 12   Highest education level: High school graduate  Occupational History   Not on file  Tobacco Use   Smoking status: Never   Smokeless tobacco: Never  Vaping Use   Vaping Use: Never used  Substance and Sexual Activity   Alcohol use: No    Alcohol/week: 0.0 standard drinks of alcohol   Drug use: No   Sexual activity: Not Currently  Other Topics Concern   Not on file  Social History Narrative   Lives alone   Social Determinants of Health   Financial Resource Strain: Not on file  Food Insecurity: Not on file  Transportation Needs: Not on file  Physical Activity: Not on file  Stress: Not on file  Social Connections: Not on file   Past Surgical History:  Procedure Laterality Date   HERNIA REPAIR     Past Surgical History:  Procedure  Laterality Date   HERNIA REPAIR     Past Medical History:  Diagnosis Date   Diabetic peripheral neuropathy associated with type 2 diabetes mellitus 03/14/2017   DOE (dyspnea on exertion) 11/2019   Onset jan 2021 with covid 19  - assoc with atypical cp developed during the infection present mostly sitting/ absent supine/ better with deep breathing/ no worse with ex - 01/30/2020   Walked RA x two laps =  approx 538ft @ brisk pace - stopped due to end of study, no sob/ no change cp  with sats of 93 % at the end of the study and no acute ekg changes  - Echo  02/13/2020  wnl with  no pericadial ef   Fatigue 11/2019   History of COVID-19 11/2019   Hyperlipidemia associated with type 2 diabetes mellitus 09/16/2015  Hypertension associated with type 2 diabetes mellitus 09/16/2015   Knee osteoarthritis 04/14/2016   Mild neurocognitive disorder 06/12/2020   Numbness of lower extremity 11/2019   Attributed to COVID-19   Sleep apnea    Patient denied being diagnosed with OSA in the past   There were no vitals taken for this visit.  Opioid Risk Score:   Fall Risk Score:  `1  [facility]     02/11/2022    8:53 AM 01/29/2022    8:02 AM 10/02/2021    9:33 AM 06/04/2021   10:18 AM 11/17/2020   10:47 AM 08/08/2020    9:59 AM 05/02/2020    9:26 AM  Depression screen PHQ 2/9  Decreased Interest 0 0 0 0 0 0 2  Down, Depressed, Hopeless 0 0 0 0 0 0 0  PHQ - 2 Score 0 0 0 0 0 0 2  Altered sleeping       0  Tired, decreased energy       2  Change in appetite       0  Feeling bad or failure about yourself        0  Trouble concentrating       0  Moving slowly or fidgety/restless       1  Suicidal thoughts       0  PHQ-9 Score       5  Difficult doing work/chores       Somewhat difficult   Review of Systems  Constitutional: Negative.   HENT: Negative.    Eyes: Negative.   Respiratory: Negative.    Cardiovascular: Negative.   Gastrointestinal: Negative.   Endocrine: Negative.    Genitourinary: Negative.   Musculoskeletal:  Positive for gait problem.       Left shoulder pain , right and left knee pain , left leg pain  Skin: Negative.   Allergic/Immunologic: Negative.   Hematological: Negative.   Psychiatric/Behavioral: Negative.         Objective:   Physical Exam Gen: no distress, normal appearing, BMI 32.72, weight 248 lbs HEENT: oral mucosa pink and moist, NCAT Cardio: Reg rate Chest: normal effort, normal rate of breathing Abd: soft, non-distended Ext: no edema Psych: pleasant, normal affect Skin: intact Neuro: Alert and oriented x3     Assessment & Plan:  Casey Hunter is a 66 year old man who presents for follow-up on LONG COVID symptoms.   1) HTN: Improved control. Discontinue Losartan -refilled nifedipine and flomax -continue eating a banana every day -continue grapefruit at least once per week.  -Advised checking BP daily at home and logging results to bring into follow-up appointment with her PCP and myself. -Reviewed BP meds today.  -Advised regarding healthy foods that can help lower blood pressure and provided with a list: 1) citrus foods- high in vitamins and minerals 2) salmon and other fatty fish - reduces inflammation and oxylipins 3) swiss chard (leafy green)- high level of nitrates 4) pumpkin seeds- one of the best natural sources of magnesium 5) Beans and lentils- high in fiber, magnesium, and potassium 6) Berries- high in flavonoids 7) Amaranth (whole grain, can be cooked similarly to rice and oats)- high in magnesium and fiber 8) Pistachios- even more effective at reducing BP than other nuts 9) Carrots- high in phenolic compounds that relax blood vessels and reduce inflammation 10) Celery- contain phthalides that relax tissues of arterial walls 11) Tomatoes- can also improve cholesterol and reduce risk of heart disease  12) Broccoli- good source of magnesium, calcium, and potassium 13) Greek yogurt: high in potassium and  calcium 14) Herbs and spices: Celery seed, cilantro, saffron, lemongrass, black cumin, ginseng, cinnamon, cardamom, sweet basil, and ginger 15) Chia and flax seeds- also help to lower cholesterol and blood sugar 16) Beets- high levels of nitrates that relax blood vessels  17) spinach and bananas- high in potassium  -Provided lise of supplements that can help with hypertension:  1) magnesium: one high quality brand is Bioptemizers since it contains all 7 types of magnesium, otherwise over the counter magnesium gluconate 400mg  is a good option 2) B vitamins 3) vitamin D 4) potassium 5) CoQ10 6) L-arginine 7) Vitamin C 8) Beetroot -Educated that goal BP is 120/80. -Made goal to incorporate some of the above foods into diet.     2) Fatigue: Improved but still present. Continue NAC 600mg  BID and gave list of foods that increase NAC. Off Ritalin. Increase work to 4 days per week until re-eval on April 1st. Provided with note. This is one of the reason he feels he needs to retire.   3) Vitamin D deficiency -continue ergocalciferol 50,000U once per week for 7 weeks  4) Leg numbness: Persists, but improved. Explained etiologies of nerve injury 2/2 COVID-19 vs. Diabetic neuropathy.   5) Obesity: Weight 248- lost 24 lbs in the past year- He eats salmon once per week, 1 egg daily, nuts, meat, olive oil. Made goal to avoid sugar. Discussed benefits of intermittent fasting. -Educated regarding health benefits of weight loss- for pain, general health, chronic disease prevention, immune health, mental health.  -Will monitor weight every visit.  -Consider Ro[place]ois tea daily.  -Discussed the benefits of intermittent fasting. -Discussed foods that can assist in weight loss: 1) leafy greens- high in fiber and nutrients 2) dark chocolate- improves metabolism (if prefer sweetened, best to sweeten with honey instead of sugar).  3) cruciferous vegetables- high in fiber and protein 4) full fat yogurt: high  in healthy fat, protein, calcium, and probiotics 5) apples- high in a variety of phytochemicals 6) nuts- high in fiber and protein that increase feelings of fullness 7) grapefruit: rich in nutrients, antioxidants, and fiber (not to be taken with anticoagulation) 8) beans- high in protein and fiber 9) salmon- has high quality protein and healthy fats 10) green tea- rich in polyphenols 11) eggs- rich in choline and vitamin D 12) tuna- high protein, boosts metabolism 13) avocado- decreases visceral abdominal fat 14) chicken (pasture raised): high in protein and iron 15) blueberries- reduce abdominal fat and cholesterol 16) whole grains- decreases calories retained during digestion, speeds metabolism 17) chia seeds- curb appetite 18) chilies- increases fat metabolism   6) DM 2: HgbA1c improving to 7.  PCP note reviewed- overall improved. Last checked about a month ago.  -check CBGs daily, log, and bring log to follow-up appointment -avoid sugar, bread, pasta, rice -avoid snacking -Recommended 1 glass water with 1 TB apple cider vinegar before meals to reduce CBG spike, has additional health benefits, drink with straw to protect enamel.   -prescribed Trulicity -perform daily foot exam and at least annual eye exam -try to incorporate into your diet some of the following foods which are good for diabetes: 1) cinnamon- imitates effects of insulin, increasing glucose transport into cells ( or 12-12-1981 cinnamon is best, least processed) 2) nuts- can slow down the blood sugar response of carbohydrate rich foods 3) oatmeal- contains and anti-inflammatory compound avenanthramide 4) whole-milk yogurt (best types are  no sugar, Greek yogurt, or goat/sheep yogurt) 5) beans- high in protein, fiber, and vitamins, low glycemic index 6) broccoli- great source of vitamin A and C 7) quinoa- higher in protein and fiber than other grains 8) spinach- high in vitamin A, fiber, and protein 9) olive oil-  reduces glucose levels, LDL, and triglycerides 10) salmon- excellent amount of omega-3-fatty acids 11) walnuts- rich in antioxidants 12) apples- high in fiber and quercetin 13) carrots- highly nutritious with low impact on blood sugar 14) eggs- improve HDL (good cholesterol), high in protein, keep you satiated 15) turmeric: improves blood sugars, cardiovascular disease, and protects kidney health 16) garlic: improves blood sugar, blood pressure, pain 17) tomatoes: highly nutritious with low impact on blood sugar   7) HLD -really improved on last labs! -continue Rapatha -discussed results of 10/5 lipid panel which is within normal limits except for elevated triglycerides.   8) Diabetic peripheral neuropathy Refilled Lyrica -discussed positive response to Qutenza -Discussed Qutenza as an option for neuropathic pain control. Discussed that this is a capsaicin patch, stronger than capsaicin cream. Discussed that it is currently approved for diabetic peripheral neuropathy and post-herpetic neuralgia, but that it has also shown benefit in treating other forms of neuropathy. Provided patient with link to site to learn more about the patch: https://www.clark.biz/. Discussed that the patch would be placed in office and benefits usually last 3 months. Discussed that unintended exposure to capsaicin can cause severe irritation of eyes, mucous membranes, respiratory tract, and skin, but that Qutenza is a local treatment and does not have the systemic side effects of other nerve medications. Discussed that there may be pain, itching, erythema, and decreased sensory function associated with the application of Qutenza. Side effects usually subside within 1 week. A cold pack of analgesic medications can help with these side effects. Blood pressure can also be increased due to pain associated with administration of the patch.    9) BPH: -refilled flomax to 0.8mg  HS    10) Decreased muscle mass, brain fog,  decreased strength: -improving  11) Elevated PSA -normalized on 11/22 and 10/6 labs  12) testosterone deficiency: -normalized with below interventions to 420 on 10/6 -encouraged vitamin D supplementation, resistance training, high protein/low carb diet.  -discussed that this has normalized.  -check testosterone today -continue liver once in a while for nutritional benefits.   13) History of multiple acute COVID infections -prescribed pepcid on Saturday and patient is now feeling better -discussed at least 5 day quarantine and until symptoms resolve -discussed his ivermectin use -discussed foods rich in vitamin C and zinc to help boost immune system to reduce severe infections in the future -discussed risks and benefits of COVID vaccines  14) Cataracts -discussed plan for corrective lessons.    General health: He has received Shingles and pneumococcal vaccines, early flu shot, and 2nd COVID booster.   6 minutes spent in discussion of his lab results, discussed normal lipid panel with the exception of elevated triglyceride levels  40 minutes spent in discussion of risks and benefits of Qutenza and obtaining informed consent, discussion of q90 day follow-up and expectation of improvement in pain with each repeat application, discussed turmeric to decrease inflammation, repatha, tart cherries, liver, repeating labs today, calling with lab results once they are available, commended on weight loss, discussed plan for cataract surgery, discussed not to use CBD since he is taking Lyrica

## 2022-10-07 ENCOUNTER — Other Ambulatory Visit: Payer: Self-pay | Admitting: Physical Medicine and Rehabilitation

## 2022-12-02 ENCOUNTER — Encounter: Payer: PPO | Attending: Physical Medicine and Rehabilitation | Admitting: Physical Medicine and Rehabilitation

## 2022-12-02 ENCOUNTER — Encounter: Payer: Self-pay | Admitting: Physical Medicine and Rehabilitation

## 2022-12-02 VITALS — BP 133/87 | HR 89 | Ht 73.0 in | Wt 241.8 lb

## 2022-12-02 DIAGNOSIS — N401 Enlarged prostate with lower urinary tract symptoms: Secondary | ICD-10-CM | POA: Diagnosis not present

## 2022-12-02 DIAGNOSIS — U099 Post covid-19 condition, unspecified: Secondary | ICD-10-CM | POA: Insufficient documentation

## 2022-12-02 DIAGNOSIS — E291 Testicular hypofunction: Secondary | ICD-10-CM | POA: Diagnosis not present

## 2022-12-02 DIAGNOSIS — E1142 Type 2 diabetes mellitus with diabetic polyneuropathy: Secondary | ICD-10-CM

## 2022-12-02 DIAGNOSIS — E1136 Type 2 diabetes mellitus with diabetic cataract: Secondary | ICD-10-CM | POA: Insufficient documentation

## 2022-12-02 DIAGNOSIS — E1169 Type 2 diabetes mellitus with other specified complication: Secondary | ICD-10-CM | POA: Diagnosis not present

## 2022-12-02 DIAGNOSIS — I7300 Raynaud's syndrome without gangrene: Secondary | ICD-10-CM | POA: Insufficient documentation

## 2022-12-02 DIAGNOSIS — I959 Hypotension, unspecified: Secondary | ICD-10-CM | POA: Insufficient documentation

## 2022-12-02 DIAGNOSIS — X58XXXS Exposure to other specified factors, sequela: Secondary | ICD-10-CM | POA: Insufficient documentation

## 2022-12-02 DIAGNOSIS — E118 Type 2 diabetes mellitus with unspecified complications: Secondary | ICD-10-CM

## 2022-12-02 DIAGNOSIS — M79676 Pain in unspecified toe(s): Secondary | ICD-10-CM | POA: Insufficient documentation

## 2022-12-02 DIAGNOSIS — N400 Enlarged prostate without lower urinary tract symptoms: Secondary | ICD-10-CM | POA: Insufficient documentation

## 2022-12-02 DIAGNOSIS — E781 Pure hyperglyceridemia: Secondary | ICD-10-CM | POA: Diagnosis not present

## 2022-12-02 DIAGNOSIS — E785 Hyperlipidemia, unspecified: Secondary | ICD-10-CM | POA: Diagnosis not present

## 2022-12-02 DIAGNOSIS — Z79899 Other long term (current) drug therapy: Secondary | ICD-10-CM | POA: Insufficient documentation

## 2022-12-02 DIAGNOSIS — R5383 Other fatigue: Secondary | ICD-10-CM | POA: Insufficient documentation

## 2022-12-02 DIAGNOSIS — E669 Obesity, unspecified: Secondary | ICD-10-CM | POA: Insufficient documentation

## 2022-12-02 DIAGNOSIS — R350 Frequency of micturition: Secondary | ICD-10-CM

## 2022-12-02 DIAGNOSIS — E559 Vitamin D deficiency, unspecified: Secondary | ICD-10-CM

## 2022-12-02 DIAGNOSIS — I10 Essential (primary) hypertension: Secondary | ICD-10-CM | POA: Diagnosis not present

## 2022-12-02 MED ORDER — TAMSULOSIN HCL 0.4 MG PO CAPS: 0.8000 mg | ORAL_CAPSULE | Freq: Every day | ORAL | 3 refills | Status: DC

## 2022-12-02 MED ORDER — REPATHA SURECLICK 140 MG/ML ~~LOC~~ SOAJ: 140.0000 mg | SUBCUTANEOUS | 3 refills | Status: DC

## 2022-12-02 MED ORDER — DAPAGLIFLOZIN PROPANEDIOL 10 MG PO TABS: 10.0000 mg | ORAL_TABLET | Freq: Every day | ORAL | 3 refills | Status: DC

## 2022-12-02 MED ORDER — PREGABALIN 75 MG PO CAPS: 75.0000 mg | ORAL_CAPSULE | Freq: Three times a day (TID) | ORAL | 3 refills | Status: DC

## 2022-12-02 MED ORDER — CAPSAICIN-CLEANSING GEL 8 % EX KIT
4.0000 | PACK | Freq: Once | CUTANEOUS | Status: AC
  Administered 2022-12-02: 4 via TOPICAL

## 2022-12-02 MED ORDER — NIFEDIPINE ER OSMOTIC RELEASE 30 MG PO TB24: 30.0000 mg | ORAL_TABLET | Freq: Every day | ORAL | 3 refills | Status: DC

## 2022-12-02 MED ORDER — VITAMIN D (ERGOCALCIFEROL) 1.25 MG (50000 UNIT) PO CAPS: 50000.0000 [IU] | ORAL_CAPSULE | ORAL | 3 refills | Status: AC

## 2022-12-02 NOTE — Progress Notes (Addendum)
Subjective:    Patient ID: Casey Greenspan., male    DOB: 04-13-1956, 67 y.o.   MRN: [id number]  HPI  Casey Hunter returns for f/u LONG COVID, Vitamin D deficiency, and HLD  1) Long COVID symptoms -left leg still falls asleep- ready to repeat Qutenza today -some nights it can be pretty bad -it is not usually painful -he is trying to work on his diet. -he is taking the Lyrica two at night before sleeping -he has taken some ivermectin-was taking it every day when he felt bad.  -he asks about taking Pepcid and Claritin -does feel much better -feels like he is getting enough protein -he does not eat much citrus fruits due to his diabetes.  -he loves to eat strawberries and blackberries -checked testosterone level and discussed that his levels  -discussed vitamin D level and high dose supplementation -lot of stress since March 1st since his dad felt and broke his hip  2) Diabetic peripheral neuropathy: -both feet feel cold -hemoglobin A1c 7.8 on 9/15 -ready to repeat Qutenza today -CBGs better controlled- less than 150s -eating 1 egg at breakfast and drinks unsweet tea -weight is 248 lbs today -discussed his diet thoroughly.  -got 60% improvement with Qutenza first time, even better improvement this last time but he can't quantify it.  -restarted Trulicity -Ozempic was denied for him.  -pain is mostly present on soles of both feet- sometimes in his toes -tolerated last session well without any burning -discussed his hemoglobin A1c  3) BPH: -the tamsulosin has been helping him. -only urinating twice per night  4) Fatigue -Contnues to have severe fatigue at times.  -yesterday was a bad tay -panning to return in August.   5) HTN: -BP is 155/94 -has been well controlled -now off all blood pressure medications except for flomax 0.4mg .  -he has been checking BP at home and it has been stable.  -he likes cinnamon  6) Raynaud's syndrome: -the nifedipine helped his upper  extremity neuropathy but his lower extremities are still very cold at night -he continues to take this  7) History of multiple acute COVID infections -he asks why he is getting these infections every year -he is feeling better than when he called me on Saturday -he asks for how long he should quarantine.   8) Vitamin D deficiency -discussed that level has improved to 40s, and has remained stable there since we checked 4 months ago -discussed that this was an improvement from the first time we checked -prescribed refill for 20 weeks of weekly high dose supplement -would like to repeat level today  9) elevated triglycerides -recommended avoiding processed fodos -repatha was not covered and he would like to see     He continues to have numbness in his left calf. It is better than initially. His left hand will also sleep. It hurts but he is not sure if this is pain.  He has been having increase readings on his Freestyle Libre device- around 200s. For breakfast he eats eggs and sausage. For lunch he eats barebcue and sometimes french fries. Once in a while he eats sugar. He drinks water and unsweet tea and a fruit drink. He eats two bananas every day.   He has been checking pressures every day and they have been well controlled. He takes his BP medication 5 days per week.  Prior history: Dizziness is better, but still present at times. He has brought with him an excellent log of his  AM and PM blood pressures, which are excellent off any blood pressure medications.   Obesity: He has made excellent changes to his diet. He has lost 2 lbs since last visit (currently 266 lbs). He has been eating one banana per day, eggs daily, salmon once per week, meat, and a lot of nuts. He feels he still needs to minimize sweets.   Energy: Has greatly improved but is still not at the level required for his work. He has weaned off the Ritalin. Discussed goal of restarting work on October 11th for three days  per week and he is agreeable.   Reviewed PCP note and he has been doing much better. HbA1c has improved to 7.7  Left leg numbness continues but is slightly improved.     Pain Inventory Average Pain 4 Pain Right Now 7 My pain is intermittent, constant, and tingling  In the last 24 hours, has pain interfered with the following? General activity 4 Relation with others 2 Enjoyment of life 5 What TIME of day is your pain at its worst? varies Sleep (in general) Fair  Pain is worse with: walking, bending, and some activites Pain improves with: medication Relief from Meds: 5  Family History  Problem Relation Age of Onset   Hypertension Mother    Hyperlipidemia Mother    Hyperlipidemia Father    Social History   Socioeconomic History   Marital status: Divorced    Spouse name: Not on file   Number of children: 2   Years of education: 12   Highest education level: High school graduate  Occupational History   Not on file  Tobacco Use   Smoking status: Never   Smokeless tobacco: Never  Vaping Use   Vaping Use: Never used  Substance and Sexual Activity   Alcohol use: No    Alcohol/week: 0.0 standard drinks of alcohol   Drug use: No   Sexual activity: Not Currently  Other Topics Concern   Not on file  Social History Narrative   Lives alone   Social Determinants of Health   Financial Resource Strain: Not on file  Food Insecurity: Not on file  Transportation Needs: Not on file  Physical Activity: Not on file  Stress: Not on file  Social Connections: Not on file   Past Surgical History:  Procedure Laterality Date   HERNIA REPAIR     Past Surgical History:  Procedure Laterality Date   HERNIA REPAIR     Past Medical History:  Diagnosis Date   Diabetic peripheral neuropathy associated with type 2 diabetes mellitus 03/14/2017   DOE (dyspnea on exertion) 11/2019   Onset jan 2021 with covid 19  - assoc with atypical cp developed during the infection present mostly  sitting/ absent supine/ better with deep breathing/ no worse with ex - 01/30/2020   Walked RA x two laps =  approx 530ft @ brisk pace - stopped due to end of study, no sob/ no change cp  with sats of 93 % at the end of the study and no acute ekg changes  - Echo  02/13/2020  wnl with  no pericadial ef   Fatigue 11/2019   History of COVID-19 11/2019   Hyperlipidemia associated with type 2 diabetes mellitus 09/16/2015   Hypertension associated with type 2 diabetes mellitus 09/16/2015   Knee osteoarthritis 04/14/2016   Mild neurocognitive disorder 06/12/2020   Numbness of lower extremity 11/2019   Attributed to COVID-19   Sleep apnea    Patient denied being  diagnosed with OSA in the past   BP 133/87   Pulse 89   Ht 6\' 1"  (1.854 m)   Wt 241 lb 12.8 oz (109.7 kg)   SpO2 97%   BMI 31.90 kg/m   Opioid Risk Score:   Fall Risk Score:  `1  Depression screen [facility] 2/9     02/11/2022    8:53 AM 01/29/2022    8:02 AM 10/02/2021    9:33 AM 06/04/2021   10:18 AM 11/17/2020   10:47 AM 08/08/2020    9:59 AM 05/02/2020    9:26 AM  Depression screen PHQ 2/9  Decreased Interest 0 0 0 0 0 0 2  Down, Depressed, Hopeless 0 0 0 0 0 0 0  PHQ - 2 Score 0 0 0 0 0 0 2  Altered sleeping       0  Tired, decreased energy       2  Change in appetite       0  Feeling bad or failure about yourself        0  Trouble concentrating       0  Moving slowly or fidgety/restless       1  Suicidal thoughts       0  PHQ-9 Score       5  Difficult doing work/chores       Somewhat difficult   Review of Systems  Constitutional: Negative.   HENT: Negative.    Eyes: Negative.   Respiratory: Negative.    Cardiovascular: Negative.   Gastrointestinal: Negative.   Endocrine: Negative.   Genitourinary: Negative.   Musculoskeletal:  Positive for gait problem.       Left shoulder pain , right and left knee pain , left leg pain  Skin: Negative.   Allergic/Immunologic: Negative.   Hematological: Negative.    Psychiatric/Behavioral: Negative.         Objective:   Physical Exam Gen: no distress, normal appearing, BMI 31.90, weight 241 bs HEENT: oral mucosa pink and moist, NCAT Cardio: Reg rate Chest: normal effort, normal rate of breathing Abd: soft, non-distended Ext: no edema Psych: pleasant, normal affect Skin: intact, no open lesions Neuro: Alert and oriented x3     Assessment & Plan:  Casey Hunter is a 67 year old man who presents for follow-up on LONG COVID symptoms.   1) HTN: Improved control. Discontinue Losartan -continue flomax -continue eating a banana every day -continue grapefruit at least once per week.  -Advised checking BP daily at home and logging results to bring into follow-up appointment with her PCP and myself. -Reviewed BP meds today.  -Advised regarding healthy foods that can help lower blood pressure and provided with a list: 1) citrus foods- high in vitamins and minerals 2) salmon and other fatty fish - reduces inflammation and oxylipins 3) swiss chard (leafy green)- high level of nitrates 4) pumpkin seeds- one of the best natural sources of magnesium 5) Beans and lentils- high in fiber, magnesium, and potassium 6) Berries- high in flavonoids 7) Amaranth (whole grain, can be cooked similarly to rice and oats)- high in magnesium and fiber 8) Pistachios- even more effective at reducing BP than other nuts 9) Carrots- high in phenolic compounds that relax blood vessels and reduce inflammation 10) Celery- contain phthalides that relax tissues of arterial walls 11) Tomatoes- can also improve cholesterol and reduce risk of heart disease 12) Broccoli- good source of magnesium, calcium, and potassium 13) Greek yogurt: high in potassium and  calcium 14) Herbs and spices: Celery seed, cilantro, saffron, lemongrass, black cumin, ginseng, cinnamon, cardamom, sweet basil, and ginger 15) Chia and flax seeds- also help to lower cholesterol and blood sugar 16) Beets- high  levels of nitrates that relax blood vessels  17) spinach and bananas- high in potassium  -Provided lise of supplements that can help with hypertension:  1) magnesium: one high quality brand is Bioptemizers since it contains all 7 types of magnesium, otherwise over the counter magnesium gluconate 400mg  is a good option 2) B vitamins 3) vitamin D 4) potassium 5) CoQ10 6) L-arginine 7) Vitamin C 8) Beetroot -Educated that goal BP is 120/80. -Made goal to incorporate some of the above foods into diet.     2) Fatigue: Improved but still present. Continue NAC 600mg  BID and gave list of foods that increase NAC. Off Ritalin. Increase work to 4 days per week until re-eval on April 1st. Provided with note. This is one of the reason he feels he needs to retire.   3) Vitamin D deficiency -refilled ergocalciferol 50,000U once per week for 7 weeks  4) Leg numbness: Persists, but improved. Explained etiologies of nerve injury 2/2 COVID-19 vs. Diabetic neuropathy.   5) Obesity: Weight 241, lost 31 lbs in the past year- He eats salmon once per week, 1 egg daily, nuts, meat, olive oil. Made goal to avoid sugar.  -Discussed the benefits of intermittent fasting. Recommended starting with pushing dinner 15 minutes earlier and when this feels easy, continuing to push dinner 15 minutes earlier. Discussed that this can help her body to improve its ability to burn fat rather than glucose, improving insulin sensitivity. Recommended drinking [place] tea in the evening to help curb appetite and for its numerous health benefits.   -Educated regarding health benefits of weight loss- for pain, general health, chronic disease prevention, immune health, mental health.  -Will monitor weight every visit.  -Consider [place] tea daily.  -Discussed the benefits of intermittent fasting. -Discussed foods that can assist in weight loss: 1) leafy greens- high in fiber and nutrients 2) dark chocolate- improves metabolism (if  prefer sweetened, best to sweeten with honey instead of sugar).  3) cruciferous vegetables- high in fiber and protein 4) full fat yogurt: high in healthy fat, protein, calcium, and probiotics 5) apples- high in a variety of phytochemicals 6) nuts- high in fiber and protein that increase feelings of fullness 7) grapefruit: rich in nutrients, antioxidants, and fiber (not to be taken with anticoagulation) 8) beans- high in protein and fiber 9) salmon- has high quality protein and healthy fats 10) green tea- rich in polyphenols 11) eggs- rich in choline and vitamin D 12) tuna- high protein, boosts metabolism 13) avocado- decreases visceral abdominal fat 14) chicken (pasture raised): high in protein and iron 15) blueberries- reduce abdominal fat and cholesterol 16) whole grains- decreases calories retained during digestion, speeds metabolism 17) chia seeds- curb appetite 18) chilies- increases fat metabolism   6) DM 2: HgbA1c improving to 7.  PCP note reviewed- overall improved. Last checked about a month ago.  -check CBGs daily, log, and bring log to follow-up appointment -avoid sugar, bread, pasta, rice -avoid snacking -Recommended 1 glass water with 1 TB apple cider vinegar before meals to reduce CBG spike, has additional health benefits, drink with straw to protect enamel.   -prescribed Trulicity -perform daily foot exam and at least annual eye exam -try to incorporate into your diet some of the following foods which are good for diabetes:  1) cinnamon- imitates effects of insulin, increasing glucose transport into cells ([place] or [place] cinnamon is best, least processed) 2) nuts- can slow down the blood sugar response of carbohydrate rich foods 3) oatmeal- contains and anti-inflammatory compound avenanthramide 4) whole-milk yogurt (best types are no sugar, [place] yogurt, or goat/sheep yogurt) 5) beans- high in protein, fiber, and vitamins, low glycemic index 6) broccoli- great  source of vitamin A and C 7) quinoa- higher in protein and fiber than other grains 8) spinach- high in vitamin A, fiber, and protein 9) olive oil- reduces glucose levels, LDL, and triglycerides 10) salmon- excellent amount of omega-3-fatty acids 11) walnuts- rich in antioxidants 12) apples- high in fiber and quercetin 13) carrots- highly nutritious with low impact on blood sugar 14) eggs- improve HDL (good cholesterol), high in protein, keep you satiated 15) turmeric: improves blood sugars, cardiovascular disease, and protects kidney health 16) garlic: improves blood sugar, blood pressure, pain 17) tomatoes: highly nutritious with low impact on blood sugar   7) HLD -really improved on last labs! -continue Rapatha -discussed results of 10/5 lipid panel which is within normal limits except for elevated triglycerides.   8) Diabetic peripheral neuropathy Refilled Lyrica -discussed positive response to Qutenza -Discussed Qutenza as an option for neuropathic pain control. Discussed that this is a capsaicin patch, stronger than capsaicin cream. Discussed that it is currently approved for diabetic peripheral neuropathy and post-herpetic neuralgia, but that it has also shown benefit in treating other forms of neuropathy. Provided patient with link to site to learn more about the patch: https://www.clark.biz/. Discussed that the patch would be placed in office and benefits usually last 3 months. Discussed that unintended exposure to capsaicin can cause severe irritation of eyes, mucous membranes, respiratory tract, and skin, but that Qutenza is a local treatment and does not have the systemic side effects of other nerve medications. Discussed that there may be pain, itching, erythema, and decreased sensory function associated with the application of Qutenza. Side effects usually subside within 1 week. A cold pack of analgesic medications can help with these side effects. Blood pressure can also be  increased due to pain associated with administration of the patch.    9) BPH: -refilled lomax to 0.8mg  HS    10) Decreased muscle mass, brain fog, decreased strength: -improving  11) Elevated PSA -normalized on 11/22 and 10/6 labs  12) testosterone deficiency: -normalized with below interventions to 420 on 10/6 -encouraged vitamin D supplementation, resistance training, high protein/low carb diet.  -discussed that this has normalized.  -check testosterone today -continue liver once in a while for nutritional benefits.   13) History of multiple acute COVID infections -prescribed pepcid on Saturday and patient is now feeling better -discussed at least 5 day quarantine and until symptoms resolve -discussed his ivermectin use -discussed foods rich in vitamin C and zinc to help boost immune system to reduce severe infections in the future -discussed risks and benefits of COVID vaccines  14) Cataracts -discussed plan for corrective lessons.    General health: He has received Shingles and pneumococcal vaccines, early flu shot, and 2nd COVID booster.   40 minutes spent in discussion of risks and benefits of Qutenza and obtaining informed consent, discussion of q90 day follow-up and expectation of improvement in pain with each repeat application, discussing his recent hemoglobin A1c, his current diet, his intentional weight loss, his current medications, his excellent blood pressure control, refilling his tamsulosin and nifedipine, his challenge in getting Repatha from his PCP, ordering  this for him

## 2022-12-03 ENCOUNTER — Emergency Department
Admission: EM | Admit: 2022-12-03 | Discharge: 2022-12-03 | Disposition: A | Discharge status: Home / Self Care | Payer: PPO | Attending: Emergency Medicine | Admitting: Emergency Medicine

## 2022-12-03 ENCOUNTER — Emergency Department: Payer: PPO

## 2022-12-03 ENCOUNTER — Other Ambulatory Visit: Payer: Self-pay

## 2022-12-03 DIAGNOSIS — S0181XA Laceration without foreign body of other part of head, initial encounter: Secondary | ICD-10-CM | POA: Insufficient documentation

## 2022-12-03 DIAGNOSIS — I672 Cerebral atherosclerosis: Secondary | ICD-10-CM | POA: Diagnosis not present

## 2022-12-03 DIAGNOSIS — S0992XA Unspecified injury of nose, initial encounter: Secondary | ICD-10-CM | POA: Diagnosis present

## 2022-12-03 DIAGNOSIS — Y93H2 Activity, gardening and landscaping: Secondary | ICD-10-CM | POA: Diagnosis not present

## 2022-12-03 DIAGNOSIS — S0121XA Laceration without foreign body of nose, initial encounter: Secondary | ICD-10-CM | POA: Diagnosis not present

## 2022-12-03 DIAGNOSIS — I10 Essential (primary) hypertension: Secondary | ICD-10-CM | POA: Diagnosis not present

## 2022-12-03 DIAGNOSIS — Z23 Encounter for immunization: Secondary | ICD-10-CM | POA: Diagnosis not present

## 2022-12-03 DIAGNOSIS — W228XXA Striking against or struck by other objects, initial encounter: Secondary | ICD-10-CM | POA: Diagnosis not present

## 2022-12-03 DIAGNOSIS — M952 Other acquired deformity of head: Secondary | ICD-10-CM | POA: Diagnosis not present

## 2022-12-03 DIAGNOSIS — I771 Stricture of artery: Secondary | ICD-10-CM | POA: Diagnosis not present

## 2022-12-03 DIAGNOSIS — S022XXA Fracture of nasal bones, initial encounter for closed fracture: Secondary | ICD-10-CM | POA: Diagnosis not present

## 2022-12-03 LAB — LIPID PANEL
Chol/HDL Ratio: 5.5 ratio — ABNORMAL HIGH (ref 0.0–5.0)
Cholesterol, Total: 264 mg/dL — ABNORMAL HIGH (ref 100–199)
HDL: 48 mg/dL (ref >39)
LDL Chol Calc (NIH): 182 mg/dL — ABNORMAL HIGH (ref 0–99)
Triglycerides: 182 mg/dL — ABNORMAL HIGH (ref 0–149)
VLDL Cholesterol Cal: 34 mg/dL (ref 5–40)

## 2022-12-03 LAB — VITAMIN D 25 HYDROXY (VIT D DEFICIENCY, FRACTURES): Vit D, 25-Hydroxy: 43 ng/mL (ref 30.0–100.0)

## 2022-12-03 LAB — HEMOGLOBIN A1C
Est. average glucose Bld gHb Est-mCnc: 237 mg/dL
Hgb A1c MFr Bld: 9.9 % — ABNORMAL HIGH (ref 4.8–5.6)

## 2022-12-03 MED ORDER — TETANUS-DIPHTH-ACELL PERTUSSIS 5-2.5-18.5 LF-MCG/0.5 IM SUSY
0.5000 mL | PREFILLED_SYRINGE | Freq: Once | INTRAMUSCULAR | Status: AC
  Administered 2022-12-03: 0.5 mL via INTRAMUSCULAR
  Filled 2022-12-03: qty 0.5

## 2022-12-03 MED ORDER — LIDOCAINE-EPINEPHRINE (PF) 2 %-1:200000 IJ SOLN
10.0000 mL | Freq: Once | INTRAMUSCULAR | Status: AC
  Administered 2022-12-03: 10 mL
  Filled 2022-12-03: qty 20

## 2022-12-03 MED ORDER — BACITRACIN ZINC 500 UNIT/GM EX OINT
TOPICAL_OINTMENT | Freq: Two times a day (BID) | CUTANEOUS | Status: DC
  Administered 2022-12-03: 1 via TOPICAL
  Filled 2022-12-03: qty 0.9

## 2022-12-03 MED ORDER — CLINDAMYCIN HCL 300 MG PO CAPS: 300.0000 mg | ORAL_CAPSULE | Freq: Four times a day (QID) | ORAL | 0 refills | Status: AC

## 2022-12-03 NOTE — ED Triage Notes (Addendum)
Pt presents to ED with c/o of cutting down a tree and states the limb came back and hit him int he face. Pt does have a lac to forehead and nose area. Pt unsure of LOC.   Pt denies blood thinner use.

## 2022-12-03 NOTE — Discharge Instructions (Addendum)
-  The sutures will dissolve on their own within the next 3 to 4 weeks.  No further interventions are needed.  -You were found to have significant nasal bone fractures.  Please follow-up with the ENT specialist using the contact information listed on this page as needed.  -In regards to the abrasion on your nose, recommend applying a topical antibiotic ointment to the nose daily and keeping covered with a Band-Aid.  -Please take the full course of the antibiotics as prescribed.  -Your CT scan does not show any evidence of skull fractures or hemorrhage in the brain.  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  Please follow-up with your primary care provider as discussed.  -Please return to the emergency department anytime if you begin to experience any new or worsening symptoms.

## 2022-12-03 NOTE — ED Provider Notes (Signed)
[facility] Provider Note    Event Date/Time   First MD Initiated Contact with Patient 12/03/22 1307     (approximate)   History   Chief Complaint Facial Injury   HPI Casey Hunter. is a 67 y.o. male, history of hypertension, hyperlipidemia, presents to the emergency department for evaluation of facial injury.  Patient states that he was cutting down the limb of a tree when it came down and hit him in the face.  Denies loss of consciousness.  He does have a laceration between his eyebrows.  He states that other than the facial pain, he otherwise feels well.  Denies chest pain, shortness of breath, neck pain, abdominal pain, nausea/vomiting, paresthesias, rash/lesions, or dizziness/lightheadedness.  History Limitations: No limitations.        Physical Exam  Triage Vital Signs: ED Triage Vitals  Enc Vitals Group     BP 12/03/22 1231 (!) 164/92     Pulse Rate 12/03/22 1231 96     Resp 12/03/22 1231 17     Temp 12/03/22 1231 98 F (36.7 C)     Temp Source 12/03/22 1231 Oral     SpO2 12/03/22 1231 97 %     Weight --      Height --      Head Circumference --      Peak Flow --      Pain Score 12/03/22 1232 4     Pain Loc --      Pain Edu? --      Excl. in GC? --     Most recent vital signs: Vitals:   12/03/22 1231  BP: (!) 164/92  Pulse: 96  Resp: 17  Temp: 98 F (36.7 C)  SpO2: 97%    General: Awake, NAD.  Skin: Warm, dry. No rashes or lesions.  Eyes: PERRL. Conjunctivae normal.  CV: Good peripheral perfusion.  Resp: Normal effort.  Abd: Soft, non-tender. No distention.  Neuro: At baseline. No gross neurological deficits.  Musculoskeletal: Normal ROM of all extremities.  Focused Exam: 2.5 cm linear laceration between the eyebrows.  Mild active bleeding present.  No foreign bodies.  Ecchymosis present around the nasal bridge and orbits.  Superficial abrasion noted along the bridge of the nose.   Physical Exam    ED Results /  Procedures / Treatments  Labs (all labs ordered are listed, but only abnormal results are displayed) Labs Reviewed - No data to display   EKG N/A.    RADIOLOGY  ED Provider Interpretation: I personally viewed and interpreted this head CT, no evidence of acute intracranial abnormalities.  Comminuted fracture of the nasal bones present.  CT HEAD WO CONTRAST ( )  Result Date: 12/03/2022 CLINICAL DATA:  Trauma EXAM: CT HEAD WITHOUT CONTRAST TECHNIQUE: Contiguous axial images were obtained from the base of the skull through the vertex without intravenous contrast. RADIATION DOSE REDUCTION: This exam was performed according to the departmental dose-optimization program which includes automated exposure control, adjustment of the mA and/or kV according to patient size and/or use of iterative reconstruction technique. COMPARISON:  None Available. FINDINGS: Brain: No acute intracranial findings are seen. There are no signs of bleeding within the cranium. Ventricles are not dilated. Cortical sulci are prominent. There is no focal mass effect. Vascular: There is tortuosity and ectasia of basilar artery measuring up to 6.8 mm. Scattered calcifications are seen in basilar artery. Skull: No fracture is seen in calvarium. There is deformity in the nasal bones, more  so on the left side. There are small pockets of air in the soft tissues adjacent to the right nasal bones. Sinuses/Orbits: There is mucosal thickening in ethmoid sinus. There are no air-fluid levels. Other: None. IMPRESSION: No acute intracranial findings are seen in noncontrast CT brain. Atrophy. 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 Comminuted fracture is seen in the nasal bones. There are pockets of air in the soft tissues adjacent to the nasal bones suggesting possible open wound in the skin. Electronically Signed   By: Elmer Picker M.D.   On: 12/03/2022 13:36    PROCEDURES:  Critical Care  performed: N/A.  Marland Kitchen.Laceration Repair  Date/Time: 12/03/2022 2:56 PM  Performed by: Teodoro Spray, PA Authorized by: Teodoro Spray, PA   Consent:    Consent obtained:  Verbal   Consent given by:  Patient   Risks, benefits, and alternatives were discussed: yes     Risks discussed:  Infection, need for additional repair, nerve damage, poor wound healing, poor cosmetic result, pain, retained foreign body, tendon damage and vascular damage   Alternatives discussed:  No treatment Universal protocol:    Patient identity confirmed:  Verbally with patient Anesthesia:    Anesthesia method:  Local infiltration   Local anesthetic:  Lidocaine 2% WITH epi Laceration details:    Location:  Face   Face location:  Forehead   Length (cm):  2.5   Depth (mm):  3 Pre-procedure details:    Preparation:  Patient was prepped and draped in usual sterile fashion Exploration:    Hemostasis achieved with:  Direct pressure   Wound extent: fascia not violated, no foreign body, no signs of injury, no nerve damage, no tendon damage, no underlying fracture and no vascular damage     Contaminated: no   Treatment:    Area cleansed with:  Saline   Amount of cleaning:  Extensive   Irrigation volume:  500 ml   Irrigation method:  Pressure wash   Debridement:  None   Undermining:  None Skin repair:    Repair method:  Sutures   Suture size:  5-0   Suture material:  Chromic gut   Suture technique:  Simple interrupted   Number of sutures:  5 Approximation:    Approximation:  Close Repair type:    Repair type:  Simple Post-procedure details:    Dressing:  Open (no dressing)   Procedure completion:  Tolerated well, no immediate complications     MEDICATIONS ORDERED IN ED: Medications  bacitracin ointment (1 Application Topical Given 12/03/22 1456)  Tdap (BOOSTRIX) injection 0.5 mL (0.5 mLs Intramuscular Given 12/03/22 1412)  lidocaine-EPINEPHrine (XYLOCAINE W/EPI) 2 %-1:200000 (PF) injection  10 mL (10 mLs Infiltration Given 12/03/22 1414)     IMPRESSION / MDM / [place] / ED COURSE  I reviewed the triage vital signs and the nursing notes.                              Differential diagnosis includes, but is not limited to, nasal bone fracture, concussion, epidural/subdural hematoma, laceration, tetanus, foreign body.  Assessment/Plan Patient presents with laceration between the eyebrows after a tree limb struck him in the face.  He does have some ecchymosis around the nose and around the orbits.  Otherwise appears well.  Vitals within normal limits for the patient.  His CT scan does not show any evidence of acute intracranial  device, but does show comminuted nasal bone fractures.  No evidence of septal hematomas.  Will provide her with a referral to ENT.  I was able to irrigate his laceration and repair it without difficulty.  Patient tolerated the procedure well.  Will provide him with a prescription for clindamycin to prevent infections.  Recommended that he apply topical antibiotic ointment to the abrasions on his face as well and keep covered with Band-Aids.  Offered pain medications, though he states he is fine with taking Tylenol ibuprofen as needed at home.  Will discharge.  Provided the patient with anticipatory guidance, return precautions, and educational material. Encouraged the patient to return to the emergency department at any time if they begin to experience any new or worsening symptoms. Patient expressed understanding and agreed with the plan.   Patient's presentation is most consistent with acute complicated illness / injury requiring diagnostic workup.       FINAL CLINICAL IMPRESSION(S) / ED DIAGNOSES   Final diagnoses:  Laceration of forehead, initial encounter  Closed fracture of nasal bone, initial encounter     Rx / DC Orders   ED Discharge Orders          Ordered    clindamycin (CLEOCIN) 300 MG capsule  4 times daily        12/03/22  1443             Note:  This document was prepared using Dragon voice recognition software and may include unintentional dictation errors.   Teodoro Spray, [place] 12/03/22 1458    Lavonia Drafts, MD 12/03/22 [phone]

## 2022-12-04 MED ORDER — TRULICITY 4.5 MG/0.5ML ~~LOC~~ SOAJ: 4.5000 mg | SUBCUTANEOUS | 3 refills | Status: DC

## 2022-12-04 NOTE — Addendum Note (Signed)
Addended by: Izora Ribas on: 12/04/2022 11:11 AM   Modules accepted: Orders

## 2022-12-07 ENCOUNTER — Encounter (HOSPITAL_BASED_OUTPATIENT_CLINIC_OR_DEPARTMENT_OTHER): Payer: PPO | Admitting: Physical Medicine and Rehabilitation

## 2022-12-07 DIAGNOSIS — E559 Vitamin D deficiency, unspecified: Secondary | ICD-10-CM

## 2022-12-07 DIAGNOSIS — X58XXXS Exposure to other specified factors, sequela: Secondary | ICD-10-CM

## 2022-12-07 DIAGNOSIS — E118 Type 2 diabetes mellitus with unspecified complications: Secondary | ICD-10-CM
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
## 2022-12-08 MED ORDER — FREESTYLE LIBRE 2 SENSOR MISC: 1.0000 [IU] | Freq: Every day | 3 refills | Status: DC

## 2022-12-08 MED ORDER — FREESTYLE LIBRE 2 READER DEVI: 1.0000 [IU] | Freq: Every day | 3 refills | Status: DC

## 2022-12-08 NOTE — Addendum Note (Signed)
Addended by: Izora Ribas on: 12/08/2022 03:37 PM   Modules accepted: Orders

## 2022-12-10 ENCOUNTER — Other Ambulatory Visit: Payer: Self-pay | Admitting: Physical Medicine and Rehabilitation

## 2022-12-10 MED ORDER — FREESTYLE LIBRE 2 READER DEVI: 1.0000 [IU] | Freq: Every day | 3 refills | Status: DC

## 2022-12-10 MED ORDER — FREESTYLE LIBRE 2 SENSOR MISC: 1.0000 [IU] | Freq: Every day | 3 refills | Status: DC

## 2022-12-23 ENCOUNTER — Ambulatory Visit (INDEPENDENT_AMBULATORY_CARE_PROVIDER_SITE_OTHER): Payer: PPO | Admitting: Internal Medicine

## 2022-12-23 ENCOUNTER — Encounter: Payer: Self-pay | Admitting: Internal Medicine

## 2022-12-23 VITALS — BP 136/78 | HR 107 | Temp 97.7°F | Resp 16 | Ht 73.0 in | Wt 240.7 lb

## 2022-12-23 DIAGNOSIS — Z23 Encounter for immunization: Secondary | ICD-10-CM

## 2022-12-23 DIAGNOSIS — E1142 Type 2 diabetes mellitus with diabetic polyneuropathy: Secondary | ICD-10-CM | POA: Diagnosis not present

## 2022-12-23 DIAGNOSIS — E559 Vitamin D deficiency, unspecified: Secondary | ICD-10-CM

## 2022-12-23 DIAGNOSIS — E1169 Type 2 diabetes mellitus with other specified complication: Secondary | ICD-10-CM | POA: Diagnosis not present

## 2022-12-23 DIAGNOSIS — R2990 Unspecified symptoms and signs involving the nervous system: Secondary | ICD-10-CM | POA: Diagnosis not present

## 2022-12-23 DIAGNOSIS — Z8679 Personal history of other diseases of the circulatory system: Secondary | ICD-10-CM | POA: Diagnosis not present

## 2022-12-23 DIAGNOSIS — U099 Post covid-19 condition, unspecified: Secondary | ICD-10-CM

## 2022-12-23 DIAGNOSIS — N401 Enlarged prostate with lower urinary tract symptoms: Secondary | ICD-10-CM

## 2022-12-23 DIAGNOSIS — E785 Hyperlipidemia, unspecified: Secondary | ICD-10-CM | POA: Diagnosis not present

## 2022-12-23 MED ORDER — METFORMIN HCL 1000 MG PO TABS: 1000.0000 mg | ORAL_TABLET | Freq: Two times a day (BID) | ORAL | 3 refills | Status: DC

## 2022-12-23 MED ORDER — TRULICITY 4.5 MG/0.5ML ~~LOC~~ SOAJ: 4.5000 mg | SUBCUTANEOUS | 3 refills | Status: DC

## 2022-12-23 NOTE — Progress Notes (Signed)
New Patient Office Visit  Subjective    Patient ID: Casey Hunter., male    DOB: 01/16/1956  Age: 67 y.o. MRN: [id number]  CC:  Chief Complaint  Patient presents with   Establish Care    HPI Dimitrios Balestrieri. presents to establish care. Following with Dr. Ranell Patrick PMR in [place].   History of HTN: had been on 5 different medications but since he had COVID his blood pressure has been much better controlled.   HLD: -Medications: Repatha 140 mg every 14 days, aspirin - statin intolerance. About to restart today. 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  -Patient is compliant with above medications and reports no side effects.  -Last lipid panel: Lipid Panel -     Component Value Date/Time   CHOL 264 (H) 12/02/2022 1149   CHOL 150 04/24/2018 0820   CHOL 166 11/22/2012 0903   TRIG 182 (H) 12/02/2022 1149   TRIG 174 (H) 04/24/2018 0820   TRIG 97 11/22/2012 0903   HDL 48 12/02/2022 1149   HDL 40 11/22/2012 0903   CHOLHDL 5.5 (H) 12/02/2022 1149   CHOLHDL 4 04/24/2021 0945   VLDL 29.2 04/24/2021 0945   VLDL 35 (H) 04/24/2018 0820   VLDL 19 11/22/2012 0903   LDLCALC 182 (H) 12/02/2022 1149   LDLCALC 107 (H) 11/22/2012 0903   LABVLDL 34 12/02/2022 1149    Diabetes, Type 2 with Neuropathy: -Last A1c 9.9% -Medications: Trulicity 4.5 mg weekly (not on it currently due to needing a prior authorization), Farxiga 10 mg, Metformin 1000 mg BID (not on currently, needs refilled). Also on Lyrica 75 mg TID, Nifedipine 30 mg for numbness and tingling. PMR does Capsaicin patches for him as well.  -Patient is compliant with the above medications and reports no side effects.  -Checking BG at home: fasting: 200-250  -Eye exam: UTD 12/23 -Foot exam: UTD  -Microalbumin: Due -Statin: Statin intolerance - on Repatha -PNA vaccine: Due  -Denies symptoms of hypoglycemia, polyuria,  polydipsia, numbness extremities, foot ulcers/trauma.   BPH: -Currently on Flomax 0.8 mg, symptoms fairly well controlled   Vitamin D Deficiency: -Currently on 50,000 IU weekly - has been on this dose about 1 year -Last Vitamin D level 43 on 12/02/22  Health Maintenance: -Blood work due -Colonoscopy 2011, repeat in 10 years, due but patient would like to postpone  -Prevnar 20 due   Outpatient Encounter Medications as of 12/23/2022  Medication Sig   acetaminophen (TYLENOL) 650 MG CR tablet Take 1,300 mg by mouth as needed.   aspirin 81 MG tablet Take 81 mg by mouth daily.   dapagliflozin propanediol (FARXIGA) 10 MG TABS tablet Take 1 tablet (10 mg total) by mouth daily.   Evolocumab (REPATHA SURECLICK) 323 MG/ML SOAJ Inject 140 mg into the skin every 14 (fourteen) days. Has failed statins, Zetia, and Niacin   meloxicam (MOBIC) 15 MG tablet TAKE 1 TABLET BY MOUTH DAILY AS  NEEDED FOR PAIN   NIFEdipine (PROCARDIA-XL/NIFEDICAL-XL) 30 MG 24 hr tablet Take 1 tablet (30 mg total) by mouth daily.   pregabalin (LYRICA) 75 MG capsule Take 1 capsule (75 mg total) by mouth 3 (three) times daily.   tamsulosin (FLOMAX) 0.4 MG CAPS capsule Take 2 capsules (0.8 mg total) by mouth daily after supper.   Vitamin D, Ergocalciferol, (DRISDOL) 1.25 MG (50000 UNIT) CAPS capsule Take 1 capsule (50,000 Units  total) by mouth every 7 (seven) days.   Continuous Blood Gluc Receiver (FREESTYLE LIBRE 2 READER) DEVI Apply 1 Units topically daily. Use as directed.   Continuous Blood Gluc Sensor (FREESTYLE LIBRE 2 SENSOR) MISC Apply 1 Units topically daily. Use as directed.   Dulaglutide (TRULICITY) 4.5 ON/6.2XB SOPN Inject 4.5 mg as directed once a week. (Patient not taking: Reported on 12/23/2022)   metFORMIN (GLUCOPHAGE) 1000 MG tablet Take 1 tablet (1,000 mg total) by mouth 2 (two) times daily with a meal. (Patient not taking: Reported on 12/23/2022)   [DISCONTINUED] Acetylcysteine, Nutrient, (N-ACETYL CYSTEINE) 600 MG TABS  Take 1 tablet by mouth 2 (two) times daily.   [DISCONTINUED] famotidine (PEPCID) 40 MG tablet Take 2 tablets (80 mg total) by mouth 2 (two) times daily. Take for no more than 14 days and stop taking when symptoms have resolved   [DISCONTINUED] losartan (COZAAR) 25 MG tablet Take 1 tablet (25 mg total) by mouth daily.   [DISCONTINUED] lovastatin (MEVACOR) 20 MG tablet Take 1 tablet (20 mg total) by mouth at bedtime.   No facility-administered encounter medications on file as of 12/23/2022.    Past Medical History:  Diagnosis Date   Diabetic peripheral neuropathy associated with type 2 diabetes mellitus 03/14/2017   DOE (dyspnea on exertion) 11/2019   Onset jan 2021 with covid 19  - assoc with atypical cp developed during the infection present mostly sitting/ absent supine/ better with deep breathing/ no worse with ex - 01/30/2020   Walked RA x two laps =  approx 581ft @ brisk pace - stopped due to end of study, no sob/ no change cp  with sats of 93 % at the end of the study and no acute ekg changes  - Echo  02/13/2020  wnl with  no pericadial ef   Fatigue 11/2019   History of COVID-19 11/2019   Hyperlipidemia associated with type 2 diabetes mellitus 09/16/2015   Hypertension associated with type 2 diabetes mellitus 09/16/2015   Knee osteoarthritis 04/14/2016   Mild neurocognitive disorder 06/12/2020   Numbness of lower extremity 11/2019   Attributed to COVID-19   Sleep apnea    Patient denied being diagnosed with OSA in the past    Past Surgical History:  Procedure Laterality Date   HERNIA REPAIR      Family History  Problem Relation Age of Onset   Hypertension Mother    Hyperlipidemia Mother    Hyperlipidemia Father     Social History   Socioeconomic History   Marital status: Divorced    Spouse name: Not on file   Number of children: 2   Years of education: 12   Highest education level: High school graduate  Occupational History   Not on file  Tobacco Use   Smoking status:  Never   Smokeless tobacco: Never  Vaping Use   Vaping Use: Never used  Substance and Sexual Activity   Alcohol use: No    Alcohol/week: 0.0 standard drinks of alcohol   Drug use: No   Sexual activity: Not Currently  Other Topics Concern   Not on file  Social History Narrative   Lives alone   Social Determinants of Health   Financial Resource Strain: Not on file  Food Insecurity: Not on file  Transportation Needs: Not on file  Physical Activity: Not on file  Stress: Not on file  Social Connections: Not on file  Intimate Partner Violence: Not on file    Review of Systems  Constitutional:  Negative for chills and fever.  Eyes:  Negative for blurred vision.  Respiratory:  Negative for shortness of breath.   Cardiovascular:  Negative for chest pain.        Objective    BP 136/78   Pulse (!) 107   Temp 97.7 F (36.5 C)   Resp 16   Ht 6\' 1"  (1.854 m)   Wt 240 lb 11.2 oz (109.2 kg)   SpO2 97%   BMI 31.76 kg/m   Physical Exam Constitutional:      Appearance: Normal appearance.  HENT:     Head: Normocephalic and atraumatic.     Mouth/Throat:     Mouth: Mucous membranes are moist.     Pharynx: Oropharynx is clear.  Eyes:     Extraocular Movements: Extraocular movements intact.     Conjunctiva/sclera: Conjunctivae normal.     Pupils: Pupils are equal, round, and reactive to light.  Cardiovascular:     Rate and Rhythm: Normal rate and regular rhythm.  Pulmonary:     Effort: Pulmonary effort is normal.     Breath sounds: Normal breath sounds.  Musculoskeletal:     Right lower leg: No edema.     Left lower leg: No edema.  Skin:    General: Skin is warm and dry.  Neurological:     General: No focal deficit present.     Mental Status: He is alert. Mental status is at baseline.  Psychiatric:        Mood and Affect: Mood normal.        Behavior: Behavior normal.         Assessment & Plan:   1. Type 2 diabetes mellitus with diabetic polyneuropathy,  without long-term current use of insulin [facility]): Due for CBC, CMP today. Will also do urine microalbumin today. Currently on Farxiga, last A1c in January uncontrolled at 9.9% but had been off some of his medications. Will restart Trulicity 4.5 mg weekly and Metformin 1000 mg BID. Plan to recheck A1c in April.   - CBC w/Diff/Platelet - COMPLETE METABOLIC PANEL WITH GFR - Urine Microalbumin w/creat. ratio - metFORMIN (GLUCOPHAGE) 1000 MG tablet; Take 1 tablet (1,000 mg total) by mouth 2 (two) times daily with a meal.  Dispense: 180 tablet; Refill: 3 - Dulaglutide (TRULICITY) 4.5 OJ/5.0KX SOPN; Inject 4.5 mg as directed once a week.  Dispense: 6 mL; Refill: 3  2. Post-COVID chronic neurologic symptoms: Following with PM&R in [place], note and labs reviewed from 12/07/22. Currently on Lyrica 75 mg TID.   3. History of high blood pressure: Had been on multiple medications for blood pressure in the past, not currently on anything for blood pressure. Continue to monitor.   4. Hyperlipidemia associated with type 2 diabetes mellitus: Uncontrolled, had been Repatha in the past and did well but hasn't had access to it for awhile, had a new prescription and is planning on starting it again tomorrow.   5. Benign prostatic hyperplasia with lower urinary tract symptoms, symptom details unspecified: Stable, continue Flomax 0.8 mg daily.   6. Vitamin D deficiency: Last checked in January, currently on 50,000 IU weekly.   7. Vaccine for streptococcus pneumoniae and influenza: Prevnar 20 administered today.   - Pneumococcal conjugate vaccine 20-valent (Prevnar 20)  Return in 2 months (on 02/21/2023) for after 4/11 for A1c.   Teodora Medici, DO

## 2022-12-23 NOTE — Patient Instructions (Addendum)
It was great seeing you today!  Plan discussed at today's visit: -Blood work ordered today, results will be uploaded to [facility].  -Urine test today to screen for diabetic kidney disease -Metformin and Trulicity both prescribed today to take in addition to [place] 20 administered today  Follow up in:  2 months - after 4/11 for diabetic follow up   Take care and let us know if you have any questions or concerns prior to your next visit.  Dr. Rosana Berger

## 2022-12-24 LAB — COMPLETE METABOLIC PANEL WITH GFR
AG Ratio: 1 (calc) (ref 1.0–2.5)
ALT: 17 U/L (ref 9–46)
AST: 14 U/L (ref 10–35)
Albumin: 4.1 g/dL (ref 3.6–5.1)
Alkaline phosphatase (APISO): 108 U/L (ref 35–144)
BUN: 11 mg/dL (ref 7–25)
CO2: 21 mmol/L (ref 20–32)
Calcium: 9.5 mg/dL (ref 8.6–10.3)
Chloride: 103 mmol/L (ref 98–110)
Creat: 0.79 mg/dL (ref 0.70–1.35)
Globulin: 4 g/dL (calc) — ABNORMAL HIGH (ref 1.9–3.7)
Glucose, Bld: 243 mg/dL — ABNORMAL HIGH (ref 65–99)
Potassium: 4.1 mmol/L (ref 3.5–5.3)
Sodium: 139 mmol/L (ref 135–146)
Total Bilirubin: 0.7 mg/dL (ref 0.2–1.2)
Total Protein: 8.1 g/dL (ref 6.1–8.1)
eGFR: 97 mL/min/{1.73_m2} (ref >=60)

## 2022-12-24 LAB — MICROALBUMIN / CREATININE URINE RATIO
Creatinine, Urine: 35 mg/dL (ref 20–320)
Microalb Creat Ratio: 91 mcg/mg creat — ABNORMAL HIGH (ref <30)
Microalb, Ur: 3.2 mg/dL

## 2022-12-24 LAB — CBC WITH DIFFERENTIAL/PLATELET
Absolute Monocytes: 462 cells/uL (ref 200–950)
Basophils Absolute: 92 cells/uL (ref 0–200)
Basophils Relative: 1.1 %
Eosinophils Absolute: 118 cells/uL (ref 15–500)
Eosinophils Relative: 1.4 %
HCT: 48.6 % (ref 38.5–50.0)
Hemoglobin: 16.4 g/dL (ref 13.2–17.1)
Lymphs Abs: 1865 cells/uL (ref 850–3900)
MCH: 28.5 pg (ref 27.0–33.0)
MCHC: 33.7 g/dL (ref 32.0–36.0)
MCV: 84.5 fL (ref 80.0–100.0)
MPV: 12 fL (ref 7.5–12.5)
Monocytes Relative: 5.5 %
Neutro Abs: 5863 cells/uL (ref 1500–7800)
Neutrophils Relative %: 69.8 %
Platelets: 273 10*3/uL (ref 140–400)
RBC: 5.75 10*6/uL (ref 4.20–5.80)
RDW: 13.1 % (ref 11.0–15.0)
Total Lymphocyte: 22.2 %
WBC: 8.4 10*3/uL (ref 3.8–10.8)

## 2023-01-11 ENCOUNTER — Telehealth: Payer: Self-pay

## 2023-01-11 ENCOUNTER — Telehealth: Payer: Self-pay | Admitting: Internal Medicine

## 2023-01-11 NOTE — Telephone Encounter (Signed)
Patient's girlfriend called wanted to speak w/ you, regarding Casey Hunter, however, I was not able to give her any information because she was not able to provide his correct DOB or full address and she got upset with me and called me a B and hung up the phone, this call took place around 9:01am, she advised we must have the wrong information and this is not a HIPAA violation, I have been his girlfriend for over 14 years and proceeded to say stupid B and hung up the phone after saying this to me.

## 2023-01-11 NOTE — Telephone Encounter (Signed)
PA started through cover my meds

## 2023-01-11 NOTE — Telephone Encounter (Signed)
Patient is calling in because he says Dr. Rosana Berger was supposed to send a prior authorization to the pharmacy for his Dulaglutide (TRULICITY) 4.5 0000000 SOPN [id number] . Patient says the pharmacy says they still haven't heard anything from Dr. Rosana Berger. Please follow up with patient.

## 2023-02-07 ENCOUNTER — Ambulatory Visit: Payer: Medicare Other | Admitting: Internal Medicine

## 2023-03-03 ENCOUNTER — Encounter: Payer: PPO | Attending: Physical Medicine and Rehabilitation | Admitting: Physical Medicine and Rehabilitation

## 2023-03-03 ENCOUNTER — Encounter: Payer: Self-pay | Admitting: Physical Medicine and Rehabilitation

## 2023-03-03 VITALS — BP 126/84 | HR 92 | Temp 97.3°F | Ht 73.0 in | Wt 242.0 lb

## 2023-03-03 DIAGNOSIS — E559 Vitamin D deficiency, unspecified: Secondary | ICD-10-CM | POA: Diagnosis not present

## 2023-03-03 DIAGNOSIS — U099 Post covid-19 condition, unspecified: Secondary | ICD-10-CM | POA: Diagnosis not present

## 2023-03-03 DIAGNOSIS — N401 Enlarged prostate with lower urinary tract symptoms: Secondary | ICD-10-CM

## 2023-03-03 DIAGNOSIS — E118 Type 2 diabetes mellitus with unspecified complications: Secondary | ICD-10-CM | POA: Insufficient documentation

## 2023-03-03 DIAGNOSIS — R350 Frequency of micturition: Secondary | ICD-10-CM

## 2023-03-03 DIAGNOSIS — E1142 Type 2 diabetes mellitus with diabetic polyneuropathy: Secondary | ICD-10-CM

## 2023-03-03 MED ORDER — CAPSAICIN-CLEANSING GEL 8 % EX KIT
4.0000 | PACK | Freq: Once | CUTANEOUS | Status: AC
  Administered 2023-03-03: 4 via TOPICAL

## 2023-03-03 NOTE — Progress Notes (Signed)
Subjective:    Patient ID: Casey Greenspan., male    DOB: June 18, 1956, 67 y.o.   MRN: [id number]  HPI   Casey Hunter returns for f/u LONG COVID, Vitamin D deficiency, and HLD  1) Long COVID symptoms -eating oysters, salmon, liver once per week -still has some fatigue -left leg still falls asleep- ready to repeat Qutenza today -some nights it can be pretty bad -it is not usually painful -he is trying to work on his diet. -he is taking the Lyrica two at night before sleeping -he has taken some ivermectin-was taking it every day when he felt bad.  -he asks about taking Pepcid and Claritin -does feel much better -feels like he is getting enough protein -he does not eat much citrus fruits due to his diabetes.  -he loves to eat strawberries and blackberries -checked testosterone level and discussed that his levels  -discussed vitamin D level and high dose supplementation -lot of stress since March 1st since his dad felt and broke his hip  2) Diabetic peripheral neuropathy: -both feet feel cold -Qutenza helps, would like to try again today -hemoglobin A1c 7.8 on 9/15, increased to 9.9 on last check -ready to repeat Qutenza today -CBGs better controlled- less than 150s -eating 1 egg at breakfast and drinks unsweet tea -weight is 248 lbs today -discussed his diet thoroughly.  -got 60% improvement with Qutenza first time, even better improvement this last time but he can't quantify it.  -restarted Trulicity -Ozempic was denied for him.  -pain is mostly present on soles of both feet- sometimes in his toes -tolerated last session well without any burning -discussed his hemoglobin A1c  3) BPH: -the tamsulosin has been helping him. -only urinating twice per night  4) Fatigue -Contnues to have severe fatigue at times.  -yesterday was a bad tay -panning to return in August.   5) HTN: -BP is 155/94 -has been well controlled -now off all blood pressure medications except for  flomax 0.4mg .  -he has been checking BP at home and it has been stable.  -he likes cinnamon  6) Raynaud's syndrome: -the nifedipine helped his upper extremity neuropathy but his lower extremities are still very cold at night -he continues to take this  7) History of multiple acute COVID infections -he asks why he is getting these infections every year -he is feeling better than when he called me on Saturday -he asks for how long he should quarantine.   8) Vitamin D deficiency -discussed that level has improved to 40s, and has remained stable there since we checked 4 months ago -discussed that this was an improvement from the first time we checked -prescribed refill for 20 weeks of weekly high dose supplement -would like to repeat level today  9) elevated triglycerides -recommended avoiding processed fodos -repatha was not covered and he would like to see   10) Accident on Friday -large piece of wood hit him in his face.  -broke his nose -had 6 stitches -really scared him  He continues to have numbness in his left calf. It is better than initially. His left hand will also sleep. It hurts but he is not sure if this is pain.  He has been having increase readings on his Freestyle Libre device- around 200s. For breakfast he eats eggs and sausage. For lunch he eats barebcue and sometimes french fries. Once in a while he eats sugar. He drinks water and unsweet tea and a fruit drink. He eats two  bananas every day.   He has been checking pressures every day and they have been well controlled. He takes his BP medication 5 days per week.  Prior history: Dizziness is better, but still present at times. He has brought with him an excellent log of his AM and PM blood pressures, which are excellent off any blood pressure medications.   Obesity: He has made excellent changes to his diet. He has lost 2 lbs since last visit (currently 266 lbs). He has been eating one banana per day, eggs daily,  salmon once per week, meat, and a lot of nuts. He feels he still needs to minimize sweets.   Energy: Has greatly improved but is still not at the level required for his work. He has weaned off the Ritalin. Discussed goal of restarting work on October 11th for three days per week and he is agreeable.   Reviewed PCP note and he has been doing much better. HbA1c has improved to 7.7  Left leg numbness continues but is slightly improved.     Pain Inventory Average Pain 4 Pain Right Now 7 My pain is intermittent, constant, and tingling  In the last 24 hours, has pain interfered with the following? General activity 4 Relation with others 2 Enjoyment of life 5 What TIME of day is your pain at its worst? varies Sleep (in general) Fair  Pain is worse with: walking, bending, and some activites Pain improves with: medication Relief from Meds: 5  Family History  Problem Relation Age of Onset   Hypertension Mother    Hyperlipidemia Mother    Hyperlipidemia Father    Social History   Socioeconomic History   Marital status: Divorced    Spouse name: Not on file   Number of children: 2   Years of education: 12   Highest education level: High school graduate  Occupational History   Not on file  Tobacco Use   Smoking status: Never   Smokeless tobacco: Never  Vaping Use   Vaping Use: Never used  Substance and Sexual Activity   Alcohol use: No    Alcohol/week: 0.0 standard drinks of alcohol   Drug use: No   Sexual activity: Not Currently  Other Topics Concern   Not on file  Social History Narrative   Lives alone   Social Determinants of Health   Financial Resource Strain: Not on file  Food Insecurity: Not on file  Transportation Needs: Not on file  Physical Activity: Not on file  Stress: Not on file  Social Connections: Not on file   Past Surgical History:  Procedure Laterality Date   HERNIA REPAIR     Past Surgical History:  Procedure Laterality Date   HERNIA REPAIR      Past Medical History:  Diagnosis Date   Diabetic peripheral neuropathy associated with type 2 diabetes mellitus 03/14/2017   DOE (dyspnea on exertion) 11/2019   Onset jan 2021 with covid 19  - assoc with atypical cp developed during the infection present mostly sitting/ absent supine/ better with deep breathing/ no worse with ex - 01/30/2020   Walked RA x two laps =  approx 52800ft @ brisk pace - stopped due to end of study, no sob/ no change cp  with sats of 93 % at the end of the study and no acute ekg changes  - Echo  02/13/2020  wnl with  no pericadial ef   Fatigue 11/2019   History of COVID-19 11/2019   Hyperlipidemia associated with  type 2 diabetes mellitus 09/16/2015   Hypertension associated with type 2 diabetes mellitus 09/16/2015   Knee osteoarthritis 04/14/2016   Mild neurocognitive disorder 06/12/2020   Numbness of lower extremity 11/2019   Attributed to COVID-19   Sleep apnea    Patient denied being diagnosed with OSA in the past   BP 126/84   Pulse 92   Temp (!) 97.3 F (36.3 C)   Ht 6\' 1"  (1.854 m)   Wt 242 lb (109.8 kg)   SpO2 95%   BMI 31.93 kg/m   Opioid Risk Score:   Fall Risk Score:  `1  Depression screen [facility] 2/9     03/03/2023    9:22 AM 12/23/2022    8:51 AM 02/11/2022    8:53 AM 01/29/2022    8:02 AM 10/02/2021    9:33 AM 06/04/2021   10:18 AM 11/17/2020   10:47 AM  Depression screen PHQ 2/9  Decreased Interest 0 0 0 0 0 0 0  Down, Depressed, Hopeless 0 0 0 0 0 0 0  PHQ - 2 Score 0 0 0 0 0 0 0  Altered sleeping  0       Tired, decreased energy  0       Change in appetite  0       Feeling bad or failure about yourself   0       Trouble concentrating  0       Moving slowly or fidgety/restless  0       Suicidal thoughts  0       PHQ-9 Score  0       Difficult doing work/chores  Not difficult at all        Review of Systems  Constitutional: Negative.   HENT: Negative.    Eyes: Negative.   Respiratory: Negative.    Cardiovascular: Negative.    Gastrointestinal: Negative.   Endocrine: Negative.   Genitourinary: Negative.   Musculoskeletal:  Positive for gait problem.       Left shoulder pain , right and left knee pain , left leg pain  Skin: Negative.   Allergic/Immunologic: Negative.   Hematological: Negative.   Psychiatric/Behavioral: Negative.         Objective:   Physical Exam Gen: no distress, normal appearing, BMI 31.90, weight 241 bs HEENT: oral mucosa pink and moist, NCAT Cardio: Reg rate Chest: normal effort, normal rate of breathing Abd: soft, non-distended Ext: no edema Psych: pleasant, normal affect Skin: intact, no open lesions Neuro: Alert and oriented x3     Assessment & Plan:  Casey Hunter is a 67 year old man who presents for follow-up on LONG COVID symptoms.   1) HTN: Improved control. Discontinue Losartan -continue flomax -continue eating a banana every day -continue grapefruit at least once per week.  -Advised checking BP daily at home and logging results to bring into follow-up appointment with her PCP and myself. -Reviewed BP meds today.  -Advised regarding healthy foods that can help lower blood pressure and provided with a list: 1) citrus foods- high in vitamins and minerals 2) salmon and other fatty fish - reduces inflammation and oxylipins 3) swiss chard (leafy green)- high level of nitrates 4) pumpkin seeds- one of the best natural sources of magnesium 5) Beans and lentils- high in fiber, magnesium, and potassium 6) Berries- high in flavonoids 7) Amaranth (whole grain, can be cooked similarly to rice and oats)- high in magnesium and fiber 8) Pistachios- even more effective at  reducing BP than other nuts 9) Carrots- high in phenolic compounds that relax blood vessels and reduce inflammation 10) Celery- contain phthalides that relax tissues of arterial walls 11) Tomatoes- can also improve cholesterol and reduce risk of heart disease 12) Broccoli- good source of magnesium, calcium, and  potassium 13) Greek yogurt: high in potassium and calcium 14) Herbs and spices: Celery seed, cilantro, saffron, lemongrass, black cumin, ginseng, cinnamon, cardamom, sweet basil, and ginger 15) Chia and flax seeds- also help to lower cholesterol and blood sugar 16) Beets- high levels of nitrates that relax blood vessels  17) spinach and bananas- high in potassium  -Provided lise of supplements that can help with hypertension:  1) magnesium: one high quality brand is Bioptemizers since it contains all 7 types of magnesium, otherwise over the counter magnesium gluconate 400mg  is a good option 2) B vitamins 3) vitamin D 4) potassium 5) CoQ10 6) L-arginine 7) Vitamin C 8) Beetroot -Educated that goal BP is 120/80. -Made goal to incorporate some of the above foods into diet.     2) Fatigue: Improved but still present. Continue NAC 600mg  BID and gave list of foods that increase NAC. Off Ritalin. Increase work to 4 days per week until re-eval on April 1st. Provided with note. This is one of the reason he feels he needs to retire.   3) Vitamin D deficiency -refilled ergocalciferol 50,000U once per week for 7 weeks  4) Leg numbness: Persists, but improved. Explained etiologies of nerve injury 2/2 COVID-19 vs. Diabetic neuropathy.   5) Obesity: Weight 241, lost 31 lbs in the past year- He eats salmon once per week, 1 egg daily, nuts, meat, olive oil. Made goal to avoid sugar.  -Discussed the benefits of intermittent fasting. Recommended starting with pushing dinner 15 minutes earlier and when this feels easy, continuing to push dinner 15 minutes earlier. Discussed that this can help her body to improve its ability to burn fat rather than glucose, improving insulin sensitivity. Recommended drinking [place] tea in the evening to help curb appetite and for its numerous health benefits.   -Educated regarding health benefits of weight loss- for pain, general health, chronic disease prevention, immune  health, mental health.  -Will monitor weight every visit.  -Consider Ro[place] tea daily.  -Discussed the benefits of intermittent fasting. -Discussed foods that can assist in weight loss: 1) leafy greens- high in fiber and nutrients 2) dark chocolate- improves metabolism (if prefer sweetened, best to sweeten with honey instead of sugar).  3) cruciferous vegetables- high in fiber and protein 4) full fat yogurt: high in healthy fat, protein, calcium, and probiotics 5) apples- high in a variety of phytochemicals 6) nuts- high in fiber and protein that increase feelings of fullness 7) grapefruit: rich in nutrients, antioxidants, and fiber (not to be taken with anticoagulation) 8) beans- high in protein and fiber 9) salmon- has high quality protein and healthy fats 10) green tea- rich in polyphenols 11) eggs- rich in choline and vitamin D 12) tuna- high protein, boosts metabolism 13) avocado- decreases visceral abdominal fat 14) chicken (pasture raised): high in protein and iron 15) blueberries- reduce abdominal fat and cholesterol 16) whole grains- decreases calories retained during digestion, speeds metabolism 17) chia seeds- curb appetite 18) chilies- increases fat metabolism   6) DM 2:  Discussed HgbA1c worsening to 9.9. Repeat today.  -restarted metformin 500mg  daily.  -check CBGs daily, log, and bring log to follow-up appointment -avoid sugar, bread, pasta, rice -avoid snacking -Recommended 1 glass  water with 1 TB apple cider vinegar before meals to reduce CBG spike, has additional health benefits, drink with straw to protect enamel.   -prescribed Trulicity -perform daily foot exam and at least annual eye exam -try to incorporate into your diet some of the following foods which are good for diabetes: 1) cinnamon- imitates effects of insulin, increasing glucose transport into cells ([place] cinnamon is best, least processed) 2) nuts- can slow down the blood sugar  response of carbohydrate rich foods 3) oatmeal- contains and anti-inflammatory compound avenanthramide 4) whole-milk yogurt (best types are no sugar, [place] yogurt, or goat/sheep yogurt) 5) beans- high in protein, fiber, and vitamins, low glycemic index 6) broccoli- great source of vitamin A and C 7) quinoa- higher in protein and fiber than other grains 8) spinach- high in vitamin A, fiber, and protein 9) olive oil- reduces glucose levels, LDL, and triglycerides 10) salmon- excellent amount of omega-3-fatty acids 11) walnuts- rich in antioxidants 12) apples- high in fiber and quercetin 13) carrots- highly nutritious with low impact on blood sugar 14) eggs- improve HDL (good cholesterol), high in protein, keep you satiated 15) turmeric: improves blood sugars, cardiovascular disease, and protects kidney health 16) garlic: improves blood sugar, blood pressure, pain 17) tomatoes: highly nutritious with low impact on blood sugar   7) HLD -really improved on last labs! -continue Rapatha -discussed results of 10/5 lipid panel which is within normal limits except for elevated triglycerides.   8) Diabetic peripheral neuropathy Refilled Lyrica -discussed positive response to Qutenza -Discussed Qutenza as an option for neuropathic pain control. Discussed that this is a capsaicin patch, stronger than capsaicin cream. Discussed that it is currently approved for diabetic peripheral neuropathy and post-herpetic neuralgia, but that it has also shown benefit in treating other forms of neuropathy. Provided patient with link to site to learn more about the patch: https://www.clark.biz/. Discussed that the patch would be placed in office and benefits usually last 3 months. Discussed that unintended exposure to capsaicin can cause severe irritation of eyes, mucous membranes, respiratory tract, and skin, but that Qutenza is a local treatment and does not have the systemic side effects of other nerve medications.  Discussed that there may be pain, itching, erythema, and decreased sensory function associated with the application of Qutenza. Side effects usually subside within 1 week. A cold pack of analgesic medications can help with these side effects. Blood pressure can also be increased due to pain associated with administration of the patch.    Prescribed Zynex Nexwave  4 patches of Qutenza was applied to the area of pain. Ice packs were applied during the procedure to ensure patient comfort. Blood pressure was monitored every 15 minutes. The patient tolerated the procedure well. Post-procedure instructions were given and follow-up has been scheduled.    9) BPH: -continue flomax to 0.8mg  HS    10) Decreased muscle mass, brain fog, decreased strength: -improving  11) Elevated PSA -normalized on 11/22 and 10/6 labs  12) testosterone deficiency: -normalized with below interventions to 420 on 10/6 -will check testosterone next visit.  -encouraged vitamin D supplementation, resistance training, high protein/low carb diet.  -discussed that this has normalized.  -check testosterone today -continue liver once in a while for nutritional benefits.   13) History of multiple acute COVID infections -prescribed pepcid on Saturday and patient is now feeling better -discussed at least 5 day quarantine and until symptoms resolve -discussed his ivermectin use -discussed foods rich in vitamin C and zinc to help  boost immune system to reduce severe infections in the future -discussed risks and benefits of COVID vaccines  14) Cataracts -discussed plan for corrective lessons.  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   General health: He has received Shingles and pneumococcal vaccines, early flu shot, and 2nd COVID booster.

## 2023-03-04 LAB — HEMOGLOBIN A1C
Est. average glucose Bld gHb Est-mCnc: 243 mg/dL
Hgb A1c MFr Bld: 10.1 % — ABNORMAL HIGH (ref 4.8–5.6)

## 2023-03-07 ENCOUNTER — Encounter (HOSPITAL_BASED_OUTPATIENT_CLINIC_OR_DEPARTMENT_OTHER): Payer: PPO | Admitting: Physical Medicine and Rehabilitation

## 2023-03-07 DIAGNOSIS — E118 Type 2 diabetes mellitus with unspecified complications: Secondary | ICD-10-CM

## 2023-03-08 DIAGNOSIS — G629 Polyneuropathy, unspecified: Secondary | ICD-10-CM | POA: Diagnosis not present

## 2023-03-08 NOTE — Progress Notes (Signed)
Subjective:    Patient ID: Casey Hunter., male    DOB: February 20, 1956, 67 y.o.   MRN: [id number]  HPI   Casey Hunter returns for f/u LONG COVID, Vitamin D deficiency, and HLD, and type 2 diabetes  1) Long COVID symptoms -eating oysters, salmon, liver once per week -still has some fatigue -left leg still falls asleep- ready to repeat Qutenza today -some nights it can be pretty bad -it is not usually painful -he is trying to work on his diet. -he is taking the Lyrica two at night before sleeping -he has taken some ivermectin-was taking it every day when he felt bad.  -he asks about taking Pepcid and Claritin -does feel much better -feels like he is getting enough protein -he does not eat much citrus fruits due to his diabetes.  -he loves to eat strawberries and blackberries -checked testosterone level and discussed that his levels  -discussed vitamin D level and high dose supplementation -lot of stress since March 1st since his dad felt and broke his hip  2) Diabetic peripheral neuropathy: -both feet feel cold -Qutenza helps, would like to try again today -hemoglobin A1c 7.8 on 9/15, increased to 9.9 in January, and 10.1 on recent check -he has been very stressed by his parents' health -ready to repeat Qutenza today -CBGs better controlled- less than 150s -eating 1 egg at breakfast and drinks unsweet tea -weight is 248 lbs today -discussed his diet thoroughly.  -got 60% improvement with Qutenza first time, even better improvement this last time but he can't quantify it.  -restarted Trulicity -Ozempic was denied for him.  -pain is mostly present on soles of both feet- sometimes in his toes -tolerated last session well without any burning -discussed his hemoglobin A1c  3) BPH: -the tamsulosin has been helping him. -only urinating twice per night  4) Fatigue -Contnues to have severe fatigue at times.  -yesterday was a bad tay -panning to return in August.   5)  HTN: -BP is 155/94 -has been well controlled -now off all blood pressure medications except for flomax 0.4mg .  -he has been checking BP at home and it has been stable.  -he likes cinnamon  6) Raynaud's syndrome: -the nifedipine helped his upper extremity neuropathy but his lower extremities are still very cold at night -he continues to take this  7) History of multiple acute COVID infections -he asks why he is getting these infections every year -he is feeling better than when he called me on Saturday -he asks for how long he should quarantine.   8) Vitamin D deficiency -discussed that level has improved to 40s, and has remained stable there since we checked 4 months ago -discussed that this was an improvement from the first time we checked -prescribed refill for 20 weeks of weekly high dose supplement -would like to repeat level today  9) elevated triglycerides -recommended avoiding processed fodos -repatha was not covered and he would like to see   10) Accident on Friday -large piece of wood hit him in his face.  -broke his nose -had 6 stitches -really scared him  He continues to have numbness in his left calf. It is better than initially. His left hand will also sleep. It hurts but he is not sure if this is pain.  He has been having increase readings on his Freestyle Libre device- around 200s. For breakfast he eats eggs and sausage. For lunch he eats barebcue and sometimes french fries. Once in a  while he eats sugar. He drinks water and unsweet tea and a fruit drink. He eats two bananas every day.   He has been checking pressures every day and they have been well controlled. He takes his BP medication 5 days per week.  Prior history: Dizziness is better, but still present at times. He has brought with him an excellent log of his AM and PM blood pressures, which are excellent off any blood pressure medications.   Obesity: He has made excellent changes to his diet. He has  lost 2 lbs since last visit (currently 266 lbs). He has been eating one banana per day, eggs daily, salmon once per week, meat, and a lot of nuts. He feels he still needs to minimize sweets.   Energy: Has greatly improved but is still not at the level required for his work. He has weaned off the Ritalin. Discussed goal of restarting work on October 11th for three days per week and he is agreeable.   Reviewed PCP note and he has been doing much better. HbA1c has improved to 7.7  Left leg numbness continues but is slightly improved.     Pain Inventory Average Pain 4 Pain Right Now 7 My pain is intermittent, constant, and tingling  In the last 24 hours, has pain interfered with the following? General activity 4 Relation with others 2 Enjoyment of life 5 What TIME of day is your pain at its worst? varies Sleep (in general) Fair  Pain is worse with: walking, bending, and some activites Pain improves with: medication Relief from Meds: 5  Family History  Problem Relation Age of Onset   Hypertension Mother    Hyperlipidemia Mother    Hyperlipidemia Father    Social History   Socioeconomic History   Marital status: Divorced    Spouse name: Not on file   Number of children: 2   Years of education: 12   Highest education level: High school graduate  Occupational History   Not on file  Tobacco Use   Smoking status: Never   Smokeless tobacco: Never  Vaping Use   Vaping Use: Never used  Substance and Sexual Activity   Alcohol use: No    Alcohol/week: 0.0 standard drinks of alcohol   Drug use: No   Sexual activity: Not Currently  Other Topics Concern   Not on file  Social History Narrative   Lives alone   Social Determinants of Health   Financial Resource Strain: Not on file  Food Insecurity: Not on file  Transportation Needs: Not on file  Physical Activity: Not on file  Stress: Not on file  Social Connections: Not on file   Past Surgical History:  Procedure  Laterality Date   HERNIA REPAIR     Past Surgical History:  Procedure Laterality Date   HERNIA REPAIR     Past Medical History:  Diagnosis Date   Diabetic peripheral neuropathy associated with type 2 diabetes mellitus 03/14/2017   DOE (dyspnea on exertion) 11/2019   Onset jan 2021 with covid 19  - assoc with atypical cp developed during the infection present mostly sitting/ absent supine/ better with deep breathing/ no worse with ex - 01/30/2020   Walked RA x two laps =  approx 535ft @ brisk pace - stopped due to end of study, no sob/ no change cp  with sats of 93 % at the end of the study and no acute ekg changes  - Echo  02/13/2020  wnl with  no  pericadial ef   Fatigue 11/2019   History of COVID-19 11/2019   Hyperlipidemia associated with type 2 diabetes mellitus 09/16/2015   Hypertension associated with type 2 diabetes mellitus 09/16/2015   Knee osteoarthritis 04/14/2016   Mild neurocognitive disorder 06/12/2020   Numbness of lower extremity 11/2019   Attributed to COVID-19   Sleep apnea    Patient denied being diagnosed with OSA in the past   There were no vitals taken for this visit.  Opioid Risk Score:   Fall Risk Score:  `1  Depression screen [facility] 2/9     03/03/2023    9:22 AM 12/23/2022    8:51 AM 02/11/2022    8:53 AM 01/29/2022    8:02 AM 10/02/2021    9:33 AM 06/04/2021   10:18 AM 11/17/2020   10:47 AM  Depression screen PHQ 2/9  Decreased Interest 0 0 0 0 0 0 0  Down, Depressed, Hopeless 0 0 0 0 0 0 0  PHQ - 2 Score 0 0 0 0 0 0 0  Altered sleeping  0       Tired, decreased energy  0       Change in appetite  0       Feeling bad or failure about yourself   0       Trouble concentrating  0       Moving slowly or fidgety/restless  0       Suicidal thoughts  0       PHQ-9 Score  0       Difficult doing work/chores  Not difficult at all        Review of Systems  Constitutional: Negative.   HENT: Negative.    Eyes: Negative.   Respiratory: Negative.     Cardiovascular: Negative.   Gastrointestinal: Negative.   Endocrine: Negative.   Genitourinary: Negative.   Musculoskeletal:  Positive for gait problem.       Left shoulder pain , right and left knee pain , left leg pain  Skin: Negative.   Allergic/Immunologic: Negative.   Hematological: Negative.   Psychiatric/Behavioral: Negative.         Objective:   Physical Exam Gen: no distress, normal appearing, BMI 31.90, weight 241 bs HEENT: oral mucosa pink and moist, NCAT Cardio: Reg rate Chest: normal effort, normal rate of breathing Abd: soft, non-distended Ext: no edema Psych: pleasant, normal affect Skin: intact, no open lesions Neuro: Alert and oriented x3     Assessment & Plan:  Casey Hunter is a 67 year old man who presents for follow-up on LONG COVID symptoms.   1) HTN: Improved control. Discontinue Losartan -continue flomax -continue eating a banana every day -continue grapefruit at least once per week.  -Advised checking BP daily at home and logging results to bring into follow-up appointment with her PCP and myself. -Reviewed BP meds today.  -Advised regarding healthy foods that can help lower blood pressure and provided with a list: 1) citrus foods- high in vitamins and minerals 2) salmon and other fatty fish - reduces inflammation and oxylipins 3) swiss chard (leafy green)- high level of nitrates 4) pumpkin seeds- one of the best natural sources of magnesium 5) Beans and lentils- high in fiber, magnesium, and potassium 6) Berries- high in flavonoids 7) Amaranth (whole grain, can be cooked similarly to rice and oats)- high in magnesium and fiber 8) Pistachios- even more effective at reducing BP than other nuts 9) Carrots- high in phenolic compounds that relax  blood vessels and reduce inflammation 10) Celery- contain phthalides that relax tissues of arterial walls 11) Tomatoes- can also improve cholesterol and reduce risk of heart disease 12) Broccoli- good source  of magnesium, calcium, and potassium 13) Greek yogurt: high in potassium and calcium 14) Herbs and spices: Celery seed, cilantro, saffron, lemongrass, black cumin, ginseng, cinnamon, cardamom, sweet basil, and ginger 15) Chia and flax seeds- also help to lower cholesterol and blood sugar 16) Beets- high levels of nitrates that relax blood vessels  17) spinach and bananas- high in potassium  -Provided lise of supplements that can help with hypertension:  1) magnesium: one high quality brand is Bioptemizers since it contains all 7 types of magnesium, otherwise over the counter magnesium gluconate  is a good option 2) B vitamins 3) vitamin D 4) potassium 5) CoQ10 6) L-arginine 7) Vitamin C 8) Beetroot -Educated that goal BP is 120/80. -Made goal to incorporate some of the above foods into diet.     2) Fatigue: Improved but still present. Continue NAC  BID and gave list of foods that increase NAC. Off Ritalin. Increase work to 4 days per week until re-eval on April 1st. Provided with note. This is one of the reason he feels he needs to retire.   3) Vitamin D deficiency -refilled ergocalciferol 50,000U once per week for 7 weeks  4) Leg numbness: Persists, but improved. Explained etiologies of nerve injury 2/2 COVID-19 vs. Diabetic neuropathy.   5) Obesity: Weight 241, lost 31 lbs in the past year- He eats salmon once per week, 1 egg daily, nuts, meat, olive oil. Made goal to avoid sugar.  -Discussed the benefits of intermittent fasting. Recommended starting with pushing dinner 15 minutes earlier and when this feels easy, continuing to push dinner 15 minutes earlier. Discussed that this can help her body to improve its ability to burn fat rather than glucose, improving insulin sensitivity. Recommended drinking [place] tea in the evening to help curb appetite and for its numerous health benefits.   -Educated regarding health benefits of weight loss- for pain, general health, chronic  disease prevention, immune health, mental health.  -Will monitor weight every visit.  -Consider Roobois tea daily.  -Discussed the benefits of intermittent fasting. -Discussed foods that can assist in weight loss: 1) leafy greens- high in fiber and nutrients 2) dark chocolate- improves metabolism (if prefer sweetened, best to sweeten with honey instead of sugar).  3) cruciferous vegetables- high in fiber and protein 4) full fat yogurt: high in healthy fat, protein, calcium, and probiotics 5) apples- high in a variety of phytochemicals 6) nuts- high in fiber and protein that increase feelings of fullness 7) grapefruit: rich in nutrients, antioxidants, and fiber (not to be taken with anticoagulation) 8) beans- high in protein and fiber 9) salmon- has high quality protein and healthy fats 10) green tea- rich in polyphenols 11) eggs- rich in choline and vitamin D 12) tuna- high protein, boosts metabolism 13) avocado- decreases visceral abdominal fat 14) chicken (pasture raised): high in protein and iron 15) blueberries- reduce abdominal fat and cholesterol 16) whole grains- decreases calories retained during digestion, speeds metabolism 17) chia seeds- curb appetite 18) chilies- increases fat metabolism   6) DM 2:  Discussed HgbA1c worsening to 9.9. Repeat today.  -restarted metformin  daily.  -check CBGs daily, log, and bring log to follow-up appointment -avoid sugar, bread, pasta, rice -avoid snacking -Recommended 1 glass water with 1 TB apple cider vinegar before meals to reduce CBG spike,  has additional health benefits, drink with straw to protect enamel.   -prescribed Trulicity -perform daily foot exam and at least annual eye exam -try to incorporate into your diet some of the following foods which are good for diabetes: 1) cinnamon- imitates effects of insulin, increasing glucose transport into cells ([place] cinnamon is best, least processed) 2) nuts- can slow  down the blood sugar response of carbohydrate rich foods 3) oatmeal- contains and anti-inflammatory compound avenanthramide 4) whole-milk yogurt (best types are no sugar, [place] yogurt, or goat/sheep yogurt) 5) beans- high in protein, fiber, and vitamins, low glycemic index 6) broccoli- great source of vitamin A and C 7) quinoa- higher in protein and fiber than other grains 8) spinach- high in vitamin A, fiber, and protein 9) olive oil- reduces glucose levels, LDL, and triglycerides 10) salmon- excellent amount of omega-3-fatty acids 11) walnuts- rich in antioxidants 12) apples- high in fiber and quercetin 13) carrots- highly nutritious with low impact on blood sugar 14) eggs- improve HDL (good cholesterol), high in protein, keep you satiated 15) turmeric: improves blood sugars, cardiovascular disease, and protects kidney health 16) garlic: improves blood sugar, blood pressure, pain 17) tomatoes: highly nutritious with low impact on blood sugar   7) HLD -really improved on last labs! -continue Rapatha -discussed results of 10/5 lipid panel which is within normal limits except for elevated triglycerides.   8) Diabetic peripheral neuropathy Refilled Lyrica -discussed positive response to Qutenza -Discussed Qutenza as an option for neuropathic pain control. Discussed that this is a capsaicin patch, stronger than capsaicin cream. Discussed that it is currently approved for diabetic peripheral neuropathy and post-herpetic neuralgia, but that it has also shown benefit in treating other forms of neuropathy. Provided patient with link to site to learn more about the patch: https://www.clark.biz/. Discussed that the patch would be placed in office and benefits usually last 3 months. Discussed that unintended exposure to capsaicin can cause severe irritation of eyes, mucous membranes, respiratory tract, and skin, but that Qutenza is a local treatment and does not have the systemic side effects of  other nerve medications. Discussed that there may be pain, itching, erythema, and decreased sensory function associated with the application of Qutenza. Side effects usually subside within 1 week. A cold pack of analgesic medications can help with these side effects. Blood pressure can also be increased due to pain associated with administration of the patch.    Prescribed Zynex Nexwave  4 patches of Qutenza was applied to the area of pain. Ice packs were applied during the procedure to ensure patient comfort. Blood pressure was monitored every 15 minutes. The patient tolerated the procedure well. Post-procedure instructions were given and follow-up has been scheduled.    9) BPH: -continue flomax to 0.8mg  HS    10) Decreased muscle mass, brain fog, decreased strength: -improving  11) Elevated PSA -normalized on 11/22 and 10/6 labs  12) testosterone deficiency: -normalized with below interventions to 420 on 10/6 -will check testosterone next visit.  -encouraged vitamin D supplementation, resistance training, high protein/low carb diet.  -discussed that this has normalized.  -check testosterone today -continue liver once in a while for nutritional benefits.   13) History of multiple acute COVID infections -prescribed pepcid on Saturday and patient is now feeling better -discussed at least 5 day quarantine and until symptoms resolve -discussed his ivermectin use -discussed foods rich in vitamin C and zinc to help boost immune system to reduce severe infections in the future -discussed risks and  benefits of COVID vaccines  14) Cataracts -discussed plan for corrective lessons.  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   General health: He has received Shingles and pneumococcal vaccines, early flu shot, and 2nd COVID booster.

## 2023-03-09 DIAGNOSIS — G629 Polyneuropathy, unspecified: Secondary | ICD-10-CM | POA: Diagnosis not present

## 2023-03-10 ENCOUNTER — Telehealth: Payer: Self-pay | Admitting: Internal Medicine

## 2023-03-10 DIAGNOSIS — G629 Polyneuropathy, unspecified: Secondary | ICD-10-CM | POA: Diagnosis not present

## 2023-03-10 NOTE — Telephone Encounter (Signed)
Contacted Casey Hunter. to schedule their annual wellness visit. Patient declined to schedule AWV at this time.  [facility] Care Guide [facility] AWV TEAM Direct Dial: [phone]

## 2023-03-13 NOTE — Progress Notes (Addendum)
Established Patient Office Visit  Subjective    Patient ID: Casey Belton., male    DOB: Aug 10, 1956  Age: 67 y.o. MRN: [id number]  CC:  Chief Complaint  Patient presents with   Follow-up    HPI Casey Hunter. presents to follow up on chronic medical conditions. Following with Dr. Carlis Abbott PMR in [place].   History of HTN: had been on 5 different medications but since he had COVID his blood pressure has been much better controlled.   HLD: -Medications: Repatha 140 mg every 14 days, aspirin - statin intolerance. 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  -Patient is compliant with above medications and reports no side effects.  -Last lipid panel: Lipid Panel -     Component Value Date/Time   CHOL 264 (H) 12/02/2022 1149   CHOL 150 04/24/2018 0820   CHOL 166 11/22/2012 0903   TRIG 182 (H) 12/02/2022 1149   TRIG 174 (H) 04/24/2018 0820   TRIG 97 11/22/2012 0903   HDL 48 12/02/2022 1149   HDL 40 11/22/2012 0903   CHOLHDL 5.5 (H) 12/02/2022 1149   CHOLHDL 4 04/24/2021 0945   VLDL 29.2 04/24/2021 0945   VLDL 35 (H) 04/24/2018 0820   VLDL 19 11/22/2012 0903   LDLCALC 182 (H) 12/02/2022 1149   LDLCALC 107 (H) 11/22/2012 0903   LABVLDL 34 12/02/2022 1149    Diabetes, Type 2 with Neuropathy: -Last A1c 4/24 10.1% -Medications: Trulicity 4.5 mg weekly, Farxiga 10 mg, Metformin 1000 mg once daily ? (Written for 1000 mg BID, patient says he has 500 mg tablet at home - will call with dosing he is currently taking). Also on Lyrica 75 mg TID, Nifedipine 30 mg for numbness and tingling. PMR does Capsaicin patches for him as well.  -Patient is compliant with the above medications and reports no side effects.  -Checking BG at home: fasting: 120-180, this morning 201  -Eye exam: UTD 12/23 -Foot exam: Due today -Microalbumin: UTD 2/24 -Statin: Statin intolerance - on Repatha -PNA  vaccine: UTD  -Denies symptoms of hypoglycemia, polyuria, polydipsia, numbness extremities, foot ulcers/trauma.   BPH: -Currently on Flomax 0.8 mg, symptoms fairly well controlled   Vitamin D Deficiency: -Currently on 50,000 IU weekly - has been on this dose about 1 year -Last Vitamin D level 43 on 12/02/22  Health Maintenance: -Blood work UTD -Colonoscopy 2011, repeat in 10 years, due but patient would like to postpone     Outpatient Encounter Medications as of 03/14/2023  Medication Sig   acetaminophen (TYLENOL) 650 MG CR tablet Take 1,300 mg by mouth as needed.   aspirin 81 MG tablet Take 81 mg by mouth daily.   Continuous Blood Gluc Receiver (FREESTYLE LIBRE 2 READER) DEVI Apply 1 Units topically daily. Use as directed.   Continuous Blood Gluc Sensor (FREESTYLE LIBRE 2 SENSOR) MISC Apply 1 Units topically daily. Use as directed.   dapagliflozin propanediol (FARXIGA) 10 MG TABS tablet Take 1 tablet (10 mg total) by mouth daily.   Dulaglutide (TRULICITY) 4.5 MG/0.5ML SOPN Inject 4.5 mg as directed once a week.   Evolocumab (REPATHA SURECLICK) 140 MG/ML SOAJ Inject 140 mg into the skin every 14 (fourteen) days. Has failed statins, Zetia, and Niacin   meloxicam (MOBIC) 15 MG tablet TAKE 1 TABLET BY MOUTH DAILY AS  NEEDED FOR PAIN   metFORMIN (GLUCOPHAGE) 1000 MG tablet Take  1 tablet (1,000 mg total) by mouth 2 (two) times daily with a meal.   NIFEdipine (PROCARDIA-XL/NIFEDICAL-XL) 30 MG 24 hr tablet Take 1 tablet (30 mg total) by mouth daily.   pregabalin (LYRICA) 75 MG capsule Take 1 capsule (75 mg total) by mouth 3 (three) times daily.   tamsulosin (FLOMAX) 0.4 MG CAPS capsule Take 2 capsules (0.8 mg total) by mouth daily after supper.   Vitamin D, Ergocalciferol, (DRISDOL) 1.25 MG (50000 UNIT) CAPS capsule Take 1 capsule (50,000 Units total) by mouth every 7 (seven) days.   No facility-administered encounter medications on file as of 03/14/2023.    Past Medical History:  Diagnosis  Date   Diabetic peripheral neuropathy associated with type 2 diabetes mellitus 03/14/2017   DOE (dyspnea on exertion) 11/2019   Onset jan 2021 with covid 19  - assoc with atypical cp developed during the infection present mostly sitting/ absent supine/ better with deep breathing/ no worse with ex - 01/30/2020   Walked RA x two laps =  approx 529ft @ brisk pace - stopped due to end of study, no sob/ no change cp  with sats of 93 % at the end of the study and no acute ekg changes  - Echo  02/13/2020  wnl with  no pericadial ef   Fatigue 11/2019   History of COVID-19 11/2019   Hyperlipidemia associated with type 2 diabetes mellitus 09/16/2015   Hypertension associated with type 2 diabetes mellitus 09/16/2015   Knee osteoarthritis 04/14/2016   Mild neurocognitive disorder 06/12/2020   Numbness of lower extremity 11/2019   Attributed to COVID-19   Sleep apnea    Patient denied being diagnosed with OSA in the past    Past Surgical History:  Procedure Laterality Date   HERNIA REPAIR      Family History  Problem Relation Age of Onset   Hypertension Mother    Hyperlipidemia Mother    Hyperlipidemia Father     Social History   Socioeconomic History   Marital status: Divorced    Spouse name: Not on file   Number of children: 2   Years of education: 12   Highest education level: High school graduate  Occupational History   Not on file  Tobacco Use   Smoking status: Never   Smokeless tobacco: Never  Vaping Use   Vaping Use: Never used  Substance and Sexual Activity   Alcohol use: No    Alcohol/week: 0.0 standard drinks of alcohol   Drug use: No   Sexual activity: Not Currently  Other Topics Concern   Not on file  Social History Narrative   Lives alone   Social Determinants of Health   Financial Resource Strain: Not on file  Food Insecurity: Not on file  Transportation Needs: Not on file  Physical Activity: Not on file  Stress: Not on file  Social Connections: Not on file   Intimate Partner Violence: Not on file    Review of Systems  Constitutional:  Negative for chills and fever.  Eyes:  Negative for blurred vision.  Respiratory:  Negative for shortness of breath.   Cardiovascular:  Negative for chest pain.        Objective    BP 130/80   Pulse 85   Temp (!) 97.5 F (36.4 C)   Resp 18   Ht 6\' 1"  (1.854 m)   Wt 240 lb 12.8 oz (109.2 kg)   SpO2 98%   BMI 31.77 kg/m   Physical Exam Constitutional:  Appearance: Normal appearance.  HENT:     Head: Normocephalic and atraumatic.  Eyes:     Conjunctiva/sclera: Conjunctivae normal.  Cardiovascular:     Rate and Rhythm: Normal rate and regular rhythm.     Pulses:          Dorsalis pedis pulses are 2+ on the right side and 2+ on the left side.  Pulmonary:     Effort: Pulmonary effort is normal.     Breath sounds: Normal breath sounds.  Musculoskeletal:     Right lower leg: No edema.     Left lower leg: No edema.     Right foot: Normal range of motion. No deformity, bunion, Charcot foot, foot drop or prominent metatarsal heads.     Left foot: Normal range of motion. No deformity, bunion, Charcot foot, foot drop or prominent metatarsal heads.  Feet:     Right foot:     Protective Sensation: 6 sites tested.  6 sites sensed.     Skin integrity: Skin integrity normal.     Toenail Condition: Fungal disease present.    Left foot:     Protective Sensation: 6 sites tested.  6 sites sensed.     Skin integrity: Skin integrity normal.     Toenail Condition: Fungal disease present. Skin:    General: Skin is warm and dry.  Neurological:     General: No focal deficit present.     Mental Status: He is alert. Mental status is at baseline.  Psychiatric:        Mood and Affect: Mood normal.        Behavior: Behavior normal.         Assessment & Plan:   1. Type 2 diabetes mellitus with diabetic polyneuropathy, without long-term current use of insulin: A1c increased to 10.1% earlier this  month. Patient has had a lot of personal stress - his father passed away in January 08, 2023 and his mother has dementia and is currently in the hospital with plans to come home with hospice. Discussed diabetes is uncontrolled and we briefly discussed starting a long acting insulin. Patient would like to double check Metformin dose and work on lifestyle and diet and recheck A1c in 3 months. If the same or higher will initiate long acting insulin at that time. Continue Metformin 1000 mg BID (refilled), Farxiga 10 mg. Tried to refill Trulicity but is on back order (addend 04/19/23) and I will prescribe low dose Ozempic instead until Trulicity becomes more readily available. Consider something like Prandin or Actos. Foot exam today. Follow up in 3 months.   - HM Diabetes Foot Exam - metFORMIN (GLUCOPHAGE) 1000 MG tablet; Take 1 tablet (1,000 mg total) by mouth 2 (two) times daily with a meal.  Dispense: 180 tablet; Refill: 3   Return in 3 months (on 06/13/2023) for 3 months for medical visit, AWV in June by telephone .   Margarita Mail, DO

## 2023-03-14 ENCOUNTER — Ambulatory Visit (INDEPENDENT_AMBULATORY_CARE_PROVIDER_SITE_OTHER): Payer: PPO | Admitting: Internal Medicine

## 2023-03-14 ENCOUNTER — Encounter: Payer: Self-pay | Admitting: Internal Medicine

## 2023-03-14 VITALS — BP 130/80 | HR 85 | Temp 97.5°F | Resp 18 | Ht 73.0 in | Wt 240.8 lb

## 2023-03-14 DIAGNOSIS — E1142 Type 2 diabetes mellitus with diabetic polyneuropathy: Secondary | ICD-10-CM | POA: Diagnosis not present

## 2023-03-14 MED ORDER — METFORMIN HCL 1000 MG PO TABS: 1000.0000 mg | ORAL_TABLET | Freq: Two times a day (BID) | ORAL | 3 refills | Status: DC

## 2023-04-04 DIAGNOSIS — H16223 Keratoconjunctivitis sicca, not specified as Sjogren's, bilateral: Secondary | ICD-10-CM | POA: Diagnosis not present

## 2023-04-07 DIAGNOSIS — G629 Polyneuropathy, unspecified: Secondary | ICD-10-CM | POA: Diagnosis not present

## 2023-04-08 ENCOUNTER — Telehealth: Payer: Self-pay

## 2023-04-08 NOTE — Telephone Encounter (Signed)
PA for Repath [employer] attempted. PCP has created an PA also with the same key. My attempt was not accepted.

## 2023-04-09 DIAGNOSIS — G629 Polyneuropathy, unspecified: Secondary | ICD-10-CM | POA: Diagnosis not present

## 2023-04-12 ENCOUNTER — Telehealth: Payer: Self-pay | Admitting: Internal Medicine

## 2023-04-12 NOTE — Telephone Encounter (Signed)
Prior auth started through cover my meds on Repath on 04/12/23.

## 2023-04-12 NOTE — Telephone Encounter (Signed)
Copied from CRM [id number]. Topic: General - Other >> Apr 12, 2023  1:59 PM Carrielelia G wrote: Reason for CRM: medication needs authorization per patient: Evolocumab [facility] SURECLICK) 140 MG/ML SOAJ [[id number]]  Pharmacy using:   [facility] [address], [place] - [address] [place] [address] [place] [address] Phone: [phone] Fax: [phone] Hours: Not open 24 hours

## 2023-04-12 NOTE — Telephone Encounter (Signed)
Patient informed. Advised to call his PCP for follow up.

## 2023-04-13 NOTE — Telephone Encounter (Signed)
Prior auth approved paper faxed from cover my meds on 04/13/23

## 2023-04-15 ENCOUNTER — Telehealth: Payer: Self-pay | Admitting: Internal Medicine

## 2023-04-15 NOTE — Telephone Encounter (Unsigned)
Copied from CRM [id number]. Topic: General - Other >> Apr 15, 2023  2:31 PM Dominique A wrote: Reason for CRM: Pt states that his pharmacy is out of his medication and their is a Therapist, art. (Dulaglutide (TRULICITY) 4.5 MG/0.5ML SOPN) Pt is wanting to know if their is anything else his PCP can prescribe for him that is not insulin. Please advise.

## 2023-04-19 ENCOUNTER — Other Ambulatory Visit: Payer: Self-pay | Admitting: Internal Medicine

## 2023-04-19 ENCOUNTER — Telehealth: Payer: Self-pay | Admitting: Internal Medicine

## 2023-04-19 DIAGNOSIS — E1142 Type 2 diabetes mellitus with diabetic polyneuropathy: Secondary | ICD-10-CM

## 2023-04-19 MED ORDER — OZEMPIC (0.25 OR 0.5 MG/DOSE) 2 MG/3ML ~~LOC~~ SOPN: 0.5000 mg | PEN_INJECTOR | SUBCUTANEOUS | 0 refills | Status: DC

## 2023-04-19 NOTE — Telephone Encounter (Signed)
Prior auth started through cover my meds on ozempic

## 2023-04-19 NOTE — Telephone Encounter (Signed)
Pt notified, will work on PA

## 2023-04-20 NOTE — Telephone Encounter (Signed)
Prior auth approved

## 2023-05-06 ENCOUNTER — Other Ambulatory Visit: Payer: Self-pay

## 2023-05-06 ENCOUNTER — Ambulatory Visit (INDEPENDENT_AMBULATORY_CARE_PROVIDER_SITE_OTHER): Payer: PPO

## 2023-05-06 VITALS — Ht 74.0 in | Wt 240.0 lb

## 2023-05-06 DIAGNOSIS — E1142 Type 2 diabetes mellitus with diabetic polyneuropathy: Secondary | ICD-10-CM

## 2023-05-06 DIAGNOSIS — Z0000 Encounter for general adult medical examination without abnormal findings: Secondary | ICD-10-CM | POA: Diagnosis not present

## 2023-05-06 NOTE — Patient Instructions (Signed)
Casey Hunter , Thank you for taking time to come for your Medicare Wellness Visit. I appreciate your ongoing commitment to your health goals. Please review the following plan we discussed and let me know if I can assist you in the future.   These are the goals we discussed:  Goals       PharmD "I just take so many medications" (pt-stated)      CARE PLAN ENTRY (see longtitudinal plan of care for additional care plan information)  Current Barriers:  Diabetes: uncontrolled, complicated by chronic medical conditions including HTN, HLD, OA, psoriasis, s/p COVID, most recent A1c 7.9% Reports he just started on methylphenidate by Dr. Carlis Abbott. Denies much benefit in fatigue yet Also started weekly Vitamin D 50,000 units.  Most recent eGFR: >90 mL/min Current antihyperglycemic regimen: metformin 1000 mg BID, Trulicity 3 mg weekly, Farxiga 10 mg daily Current blood glucose readings: Utilizing FreeStyle Libre CGM Average Glucose: 7 day = 175; 14 day = 160; last 30 days 156 Time in Goal (80-180) - Time in range 70-180: 39% - Time above range: 61% - Time below range: 1% Reports a pattern of bedtime readings ~160-180, but readings 12 am -5 am increase to 190-200s Current glucose readings:  Breakfast: Lunch:  Supper: roasted chicken, sweet potato fries, macaroni and cheese; 2-3 hushpuppies, unsweet tea Cardiovascular risk reduction: Current hypertensive regimen: olmesartan/HCTZ 40/25 mg daily; amlodipine 2.5 mg daily (just reduced by Dr. Carlis Abbott), home BP readings 100-120s/60-70s Current hyperlipidemia regimen: Welchol 1875 mg TID, lovastatin 20 mg daily, ezetimibe 10 mg daily, niacin 1000 mg daily; last LDL not at goal, was 131, TG controlled at 130  Hx severe muscle pain w/ rosuvastatin 10 mg twice weekly, atorvastatin, dose unknown Notes that he doesn't like taking niacin because of flushing. Denies hx using fenofibrate Current antiplatelet regimen: ASA 81 mg  Osteoarthritis: pregabalin 50 mg TID,  cyclobenzaprine 10 mg daily; stopped meloxicam per Dr. Carlis Abbott to see if impacting GI upset  Pharmacist Clinical Goal(s):  Over the next 90 days, patient will work with PharmD and primary care provider to address optimized medication management  Interventions: Comprehensive medication review performed, medication list updated in electronic medical record Inter-disciplinary care team collaboration (see longitudinal plan of care) Extensive discussion of new medications, mechanisms, side effects. Encouraged patient to continue to follow with post-COVID clinic  Discussed concept of Dawn Phenomenon as reason for sugar increase in early hours of the morning.  Discussed goal 2 hour post prandial <180. Discussed ways to reduce CHO content of meals to target post prandials <180. He verbalized understanding.  Encouraged to continue to check home BP. Still at low end of normal. Consider d/c amlodipine (patient reports splitting 5 mg into 2.5 is difficult) vs decrease HCTZ dose in olmesartan/HCTZ to 12.5 to reduce risk of dehydration-related hypotension/dizziness Discussed cholesterol. Discussed goal LDL <70 given DM, risk factors. Given myalgias w/ rosuvastatin and atorvastatin, consider PCKS9i. Pending on-treatment LDL, may allow for d/c of ezetimibe. Would continue lovastatin d/t pleiotropic benefits of statins. May consider d/c niacin as well, TG very well controlled.   Patient Self Care Activities:  Patient will check blood glucose Q6H , document, and provide at future appointments Patient will take medications as prescribed Patient will report any questions or concerns to provider   Please see past updates related to this goal by clicking on the "Past Updates" button in the selected goal          This is a list of the screening recommended for  you and due dates:  Health Maintenance  Topic Date Due   Eye exam for diabetics  11/08/2018   COVID-19 Vaccine (4 - 2023-24 season) 07/23/2022   Zoster  (Shingles) Vaccine (2 of 2) 06/13/2023*   Colon Cancer Screening  12/24/2023*   Flu Shot  06/23/2023   Hemoglobin A1C  09/02/2023   Yearly kidney function blood test for diabetes  12/24/2023   Yearly kidney health urinalysis for diabetes  12/24/2023   Complete foot exam   03/13/2024   Medicare Annual Wellness Visit  05/05/2024   DTaP/Tdap/Td vaccine (4 - Td or Tdap) 12/03/2032   Pneumonia Vaccine  Completed   Hepatitis C Screening  Completed   HPV Vaccine  Aged Out  *Topic was postponed. The date shown is not the original due date.    Advanced directives: no  Conditions/risks identified: low falls risk  Next appointment: Follow up in one year for your annual wellness visit. 05/10/2024 @ 8:45am telephone  Preventive Care 65 Years and Older, Male  Preventive care refers to lifestyle choices and visits with your health care provider that can promote health and wellness. What does preventive care include? A yearly physical exam. This is also called an annual well check. Dental exams once or twice a year. Routine eye exams. Ask your health care provider how often you should have your eyes checked. Personal lifestyle choices, including: Daily care of your teeth and gums. Regular physical activity. Eating a healthy diet. Avoiding tobacco and drug use. Limiting alcohol use. Practicing safe sex. Taking low doses of aspirin every day. Taking vitamin and mineral supplements as recommended by your health care provider. What happens during an annual well check? The services and screenings done by your health care provider during your annual well check will depend on your age, overall health, lifestyle risk factors, and family history of disease. Counseling  Your health care provider may ask you questions about your: Alcohol use. Tobacco use. Drug use. Emotional well-being. Home and relationship well-being. Sexual activity. Eating habits. History of falls. Memory and ability to  understand (cognition). Work and work Astronomer. Screening  You may have the following tests or measurements: Height, weight, and BMI. Blood pressure. Lipid and cholesterol levels. These may be checked every 5 years, or more frequently if you are over 50 years old. Skin check. Lung cancer screening. You may have this screening every year starting at age 46 if you have a 30-pack-year history of smoking and currently smoke or have quit within the past 15 years. Fecal occult blood test (FOBT) of the stool. You may have this test every year starting at age 72. Flexible sigmoidoscopy or colonoscopy. You may have a sigmoidoscopy every 5 years or a colonoscopy every 10 years starting at age 70. Prostate cancer screening. Recommendations will vary depending on your family history and other risks. Hepatitis C blood test. Hepatitis B blood test. Sexually transmitted disease (STD) testing. Diabetes screening. This is done by checking your blood sugar (glucose) after you have not eaten for a while (fasting). You may have this done every 1-3 years. Abdominal aortic aneurysm (AAA) screening. You may need this if you are a current or former smoker. Osteoporosis. You may be screened starting at age 43 if you are at high risk. Talk with your health care provider about your test results, treatment options, and if necessary, the need for more tests. Vaccines  Your health care provider may recommend certain vaccines, such as: Influenza vaccine. This is recommended every year.  Tetanus, diphtheria, and acellular pertussis (Tdap, Td) vaccine. You may need a Td booster every 10 years. Zoster vaccine. You may need this after age 89. Pneumococcal 13-valent conjugate (PCV13) vaccine. One dose is recommended after age 82. Pneumococcal polysaccharide (PPSV23) vaccine. One dose is recommended after age 54. Talk to your health care provider about which screenings and vaccines you need and how often you need them. This  information is not intended to replace advice given to you by your health care provider. Make sure you discuss any questions you have with your health care provider. Document Released: 12/05/2015 Document Revised: 07/28/2016 Document Reviewed: 09/09/2015 Elsevier Interactive Patient Education  2017 [employer].  Fall Prevention in the Home Falls can cause injuries. They can happen to people of all ages. There are many things you can do to make your home safe and to help prevent falls. What can I do on the outside of my home? Regularly fix the edges of walkways and driveways and fix any cracks. Remove anything that might make you trip as you walk through a door, such as a raised step or threshold. Trim any bushes or trees on the path to your home. Use bright outdoor lighting. Clear any walking paths of anything that might make someone trip, such as rocks or tools. Regularly check to see if handrails are loose or broken. Make sure that both sides of any steps have handrails. Any raised decks and porches should have guardrails on the edges. Have any leaves, snow, or ice cleared regularly. Use sand or salt on walking paths during winter. Clean up any spills in your garage right away. This includes oil or grease spills. What can I do in the bathroom? Use night lights. Install grab bars by the toilet and in the tub and shower. Do not use towel bars as grab bars. Use non-skid mats or decals in the tub or shower. If you need to sit down in the shower, use a plastic, non-slip stool. Keep the floor dry. Clean up any water that spills on the floor as soon as it happens. Remove soap buildup in the tub or shower regularly. Attach bath mats securely with double-sided non-slip rug tape. Do not have throw rugs and other things on the floor that can make you trip. What can I do in the bedroom? Use night lights. Make sure that you have a light by your bed that is easy to reach. Do not use any sheets or  blankets that are too big for your bed. They should not hang down onto the floor. Have a firm chair that has side arms. You can use this for support while you get dressed. Do not have throw rugs and other things on the floor that can make you trip. What can I do in the kitchen? Clean up any spills right away. Avoid walking on wet floors. Keep items that you use a lot in easy-to-reach places. If you need to reach something above you, use a strong step stool that has a grab bar. Keep electrical cords out of the way. Do not use floor polish or wax that makes floors slippery. If you must use wax, use non-skid floor wax. Do not have throw rugs and other things on the floor that can make you trip. What can I do with my stairs? Do not leave any items on the stairs. Make sure that there are handrails on both sides of the stairs and use them. Fix handrails that are broken or loose.  Make sure that handrails are as long as the stairways. Check any carpeting to make sure that it is firmly attached to the stairs. Fix any carpet that is loose or worn. Avoid having throw rugs at the top or bottom of the stairs. If you do have throw rugs, attach them to the floor with carpet tape. Make sure that you have a light switch at the top of the stairs and the bottom of the stairs. If you do not have them, ask someone to add them for you. What else can I do to help prevent falls? Wear shoes that: Do not have high heels. Have rubber bottoms. Are comfortable and fit you well. Are closed at the toe. Do not wear sandals. If you use a stepladder: Make sure that it is fully opened. Do not climb a closed stepladder. Make sure that both sides of the stepladder are locked into place. Ask someone to hold it for you, if possible. Clearly mark and make sure that you can see: Any grab bars or handrails. First and last steps. Where the edge of each step is. Use tools that help you move around (mobility aids) if they are  needed. These include: Canes. Walkers. Scooters. Crutches. Turn on the lights when you go into a dark area. Replace any light bulbs as soon as they burn out. Set up your furniture so you have a clear path. Avoid moving your furniture around. If any of your floors are uneven, fix them. If there are any pets around you, be aware of where they are. Review your medicines with your doctor. Some medicines can make you feel dizzy. This can increase your chance of falling. Ask your doctor what other things that you can do to help prevent falls. This information is not intended to replace advice given to you by your health care provider. Make sure you discuss any questions you have with your health care provider. Document Released: 09/04/2009 Document Revised: 04/15/2016 Document Reviewed: 12/13/2014 Elsevier Interactive Patient Education  2017 [employer].

## 2023-05-06 NOTE — Progress Notes (Signed)
I connected with  Casey Hunter. on 05/06/23 by a audio enabled telemedicine application and verified that I am speaking with the correct person using two identifiers.  Patient Location: Home  Provider Location: Office/Clinic  I discussed the limitations of evaluation and management by telemedicine. The patient expressed understanding and agreed to proceed.  Subjective:   Casey Hunter. is a 67 y.o. male who presents for an Initial Medicare Annual Wellness Visit.  Review of Systems    Cardiac Risk Factors include: advanced age (>37men, >90 women);obesity (BMI >30kg/m2);diabetes mellitus;dyslipidemia;male gender;hypertension    Objective:    Today's Vitals   05/06/23 0815  Weight: 240 lb (108.9 kg)  Height: 6\' 2"  (1.88 m)  PainSc: 3    Body mass index is 30.81 kg/m.     05/06/2023    8:33 AM  Advanced Directives  Does Patient Have a Medical Advance Directive? No    Current Medications (verified) Outpatient Encounter Medications as of 05/06/2023  Medication Sig   acetaminophen (TYLENOL) 650 MG CR tablet Take 1,300 mg by mouth as needed.   aspirin 81 MG tablet Take 81 mg by mouth daily.   Continuous Blood Gluc Receiver (FREESTYLE LIBRE 2 READER) DEVI Apply 1 Units topically daily. Use as directed.   Continuous Blood Gluc Sensor (FREESTYLE LIBRE 2 SENSOR) MISC Apply 1 Units topically daily. Use as directed.   dapagliflozin propanediol (FARXIGA) 10 MG TABS tablet Take 1 tablet (10 mg total) by mouth daily.   Evolocumab (REPATHA SURECLICK) 140 MG/ML SOAJ Inject 140 mg into the skin every 14 (fourteen) days. Has failed statins, Zetia, and Niacin   meloxicam (MOBIC) 15 MG tablet TAKE 1 TABLET BY MOUTH DAILY AS  NEEDED FOR PAIN   metFORMIN (GLUCOPHAGE) 1000 MG tablet Take 1 tablet (1,000 mg total) by mouth 2 (two) times daily with a meal.   NIFEdipine (PROCARDIA-XL/NIFEDICAL-XL) 30 MG 24 hr tablet Take 1 tablet (30 mg total) by mouth daily.   pregabalin (LYRICA) 75 MG capsule  Take 1 capsule (75 mg total) by mouth 3 (three) times daily.   Semaglutide,0.25 or 0.5MG /DOS, (OZEMPIC, 0.25 OR 0.5 MG/DOSE,) 2 MG/3ML SOPN Inject 0.5 mg into the skin once a week.   tamsulosin (FLOMAX) 0.4 MG CAPS capsule Take 2 capsules (0.8 mg total) by mouth daily after supper.   Vitamin D, Ergocalciferol, (DRISDOL) 1.25 MG (50000 UNIT) CAPS capsule Take 1 capsule (50,000 Units total) by mouth every 7 (seven) days.   No facility-administered encounter medications on file as of 05/06/2023.    Allergies (verified) Crestor [rosuvastatin calcium], Elemental sulfur, Lipitor [atorvastatin], and Penicillin g potassium [penicillin g]   History: Past Medical History:  Diagnosis Date   Diabetic peripheral neuropathy associated with type 2 diabetes mellitus 03/14/2017   DOE (dyspnea on exertion) 11/2019   Onset jan 2021 with covid 19  - assoc with atypical cp developed during the infection present mostly sitting/ absent supine/ better with deep breathing/ no worse with ex - 01/30/2020   Walked RA x two laps =  approx 559ft @ brisk pace - stopped due to end of study, no sob/ no change cp  with sats of 93 % at the end of the study and no acute ekg changes  - Echo  02/13/2020  wnl with  no pericadial ef   Fatigue 11/2019   History of COVID-19 11/2019   Hyperlipidemia associated with type 2 diabetes mellitus 09/16/2015   Hypertension associated with type 2 diabetes mellitus 09/16/2015   Knee  osteoarthritis 04/14/2016   Mild neurocognitive disorder 06/12/2020   Numbness of lower extremity 11/2019   Attributed to COVID-19   Sleep apnea    Patient denied being diagnosed with OSA in the past   Past Surgical History:  Procedure Laterality Date   HERNIA REPAIR     Family History  Problem Relation Age of Onset   Hypertension Mother    Hyperlipidemia Mother    Hyperlipidemia Father    Social History   Socioeconomic History   Marital status: Divorced    Spouse name: Not on file   Number of children:  2   Years of education: 12   Highest education level: High school graduate  Occupational History   Not on file  Tobacco Use   Smoking status: Never   Smokeless tobacco: Never  Vaping Use   Vaping Use: Never used  Substance and Sexual Activity   Alcohol use: No    Alcohol/week: 0.0 standard drinks of alcohol   Drug use: No   Sexual activity: Not Currently  Other Topics Concern   Not on file  Social History Narrative   Lives alone   Social Determinants of Health   Financial Resource Strain: Low Risk  (05/06/2023)   Overall Financial Resource Strain (CARDIA)    Difficulty of Paying Living Expenses: Not hard at all  Food Insecurity: Unknown (05/06/2023)   Hunger Vital Sign    Worried About Running Out of Food in the Last Year: Never true    Ran Out of Food in the Last Year: Not on file  Transportation Needs: No Transportation Needs (05/06/2023)   PRAPARE - Administrator, Civil Service (Medical): No    Lack of Transportation (Non-Medical): No  Physical Activity: Sufficiently Active (05/06/2023)   Exercise Vital Sign    Days of Exercise per Week: 5 days    Minutes of Exercise per Session: 60 min  Stress: Stress Concern Present (05/06/2023)   [employer] of Occupational Health - Occupational Stress Questionnaire    Feeling of Stress : Rather much  Social Connections: Moderately Isolated (05/06/2023)   Social Connection and Isolation Panel [NHANES]    Frequency of Communication with Friends and Family: Twice a week    Frequency of Social Gatherings with Friends and Family: Three times a week    Attends Religious Services: Never    Active Member of Clubs or Organizations: Not on file    Attends Banker Meetings: More than 4 times per year    Marital Status: Divorced    Tobacco Counseling Counseling given: Not Answered   Clinical Intake:  Pre-visit preparation completed: Yes  Pain : 0-10 Pain Score: 3  Pain Type: Chronic pain Pain Location:  Generalized Pain Descriptors / Indicators: Aching Pain Onset: More than a month ago Pain Frequency: Intermittent Pain Relieving Factors: tylenol help some  Pain Relieving Factors: tylenol help some  BMI - recorded: 30.81 Nutritional Status: BMI > 30  Obese Nutritional Risks: None Diabetes: Yes CBG done?: Yes (BS-150 this am at home) CBG resulted in Enter/ Edit results?: No Did pt. bring in CBG monitor from home?: No  How often do you need to have someone help you when you read instructions, pamphlets, or other written materials from your doctor or pharmacy?: 1 - Never  Diabetic?yes  Interpreter Needed?: No  Comments: lives alone Information entered by :: B.Brendaly Townsel,LPN   Activities of Daily Living    05/06/2023    8:33 AM 03/14/2023    1:15  PM  In your present state of health, do you have any difficulty performing the following activities:  Hearing? 0 0  Vision? 0 0  Difficulty concentrating or making decisions? 0 0  Walking or climbing stairs? 0 0  Dressing or bathing? 0 0  Doing errands, shopping? 0 0  Preparing Food and eating ? N   Using the Toilet? N   In the past six months, have you accidently leaked urine? N   Do you have problems with loss of bowel control? N   Managing your Medications? N   Managing your Finances? N   Housekeeping or managing your Housekeeping? N     Patient Care Team: Margarita Mail, DO as PCP - General (Internal Medicine)  Indicate any recent Medical Services you may have received from other than [facility] providers in the past year (date may be approximate).     Assessment:   This is a routine wellness examination for Sevon.  Hearing/Vision screen Hearing Screening - Comments:: Adequate hearing Vision Screening - Comments:: Adequate vision after corrective lense Dr Lamona Curl  Dietary issues and exercise activities discussed: Current Exercise Habits: The patient has a physically strenuous job, but has no regular exercise apart  from work., Exercise limited by: orthopedic condition(s)   Goals Addressed               This Visit's Progress     PharmD "I just take so many medications" (pt-stated)   On track     CARE PLAN ENTRY (see longtitudinal plan of care for additional care plan information)  Current Barriers:  Diabetes: uncontrolled, complicated by chronic medical conditions including HTN, HLD, OA, psoriasis, s/p COVID, most recent A1c 7.9% Reports he just started on methylphenidate by Dr. Carlis Abbott. Denies much benefit in fatigue yet Also started weekly Vitamin D 50,000 units.  Most recent eGFR: >90 mL/min Current antihyperglycemic regimen: metformin 1000 mg BID, Trulicity 3 mg weekly, Farxiga 10 mg daily Current blood glucose readings: Utilizing FreeStyle Libre CGM Average Glucose: 7 day = 175; 14 day = 160; last 30 days 156 Time in Goal (80-180) - Time in range 70-180: 39% - Time above range: 61% - Time below range: 1% Reports a pattern of bedtime readings ~160-180, but readings 12 am -5 am increase to 190-200s Current glucose readings:  Breakfast: Lunch:  Supper: roasted chicken, sweet potato fries, macaroni and cheese; 2-3 hushpuppies, unsweet tea Cardiovascular risk reduction: Current hypertensive regimen: olmesartan/HCTZ 40/25 mg daily; amlodipine 2.5 mg daily (just reduced by Dr. Carlis Abbott), home BP readings 100-120s/60-70s Current hyperlipidemia regimen: Welchol 1875 mg TID, lovastatin 20 mg daily, ezetimibe 10 mg daily, niacin 1000 mg daily; last LDL not at goal, was 131, TG controlled at 130  Hx severe muscle pain w/ rosuvastatin 10 mg twice weekly, atorvastatin, dose unknown Notes that he doesn't like taking niacin because of flushing. Denies hx using fenofibrate Current antiplatelet regimen: ASA 81 mg  Osteoarthritis: pregabalin 50 mg TID, cyclobenzaprine 10 mg daily; stopped meloxicam per Dr. Carlis Abbott to see if impacting GI upset  Pharmacist Clinical Goal(s):  Over the next 90 days, patient  will work with PharmD and primary care provider to address optimized medication management  Interventions: Comprehensive medication review performed, medication list updated in electronic medical record Inter-disciplinary care team collaboration (see longitudinal plan of care) Extensive discussion of new medications, mechanisms, side effects. Encouraged patient to continue to follow with post-COVID clinic  Discussed concept of Dawn Phenomenon as reason for sugar increase in early hours of the  morning.  Discussed goal 2 hour post prandial <180. Discussed ways to reduce CHO content of meals to target post prandials <180. He verbalized understanding.  Encouraged to continue to check home BP. Still at low end of normal. Consider d/c amlodipine (patient reports splitting 5 mg into 2.5 is difficult) vs decrease HCTZ dose in olmesartan/HCTZ to 12.5 to reduce risk of dehydration-related hypotension/dizziness Discussed cholesterol. Discussed goal LDL <70 given DM, risk factors. Given myalgias w/ rosuvastatin and atorvastatin, consider PCKS9i. Pending on-treatment LDL, may allow for d/c of ezetimibe. Would continue lovastatin d/t pleiotropic benefits of statins. May consider d/c niacin as well, TG very well controlled.   Patient Self Care Activities:  Patient will check blood glucose Q6H , document, and provide at future appointments Patient will take medications as prescribed Patient will report any questions or concerns to provider   Please see past updates related to this goal by clicking on the "Past Updates" button in the selected goal         Depression Screen    05/06/2023    8:27 AM 03/14/2023    1:15 PM 03/03/2023    9:22 AM 12/23/2022    8:51 AM 02/11/2022    8:53 AM 01/29/2022    8:02 AM 10/02/2021    9:33 AM  PHQ 2/9 Scores  PHQ - 2 Score 0 0 0 0 0 0 0  PHQ- 9 Score 0 0  0       Fall Risk    05/06/2023    8:21 AM 03/14/2023    1:15 PM 03/03/2023    9:22 AM 12/23/2022    8:51 AM  02/11/2022    8:52 AM  Fall Risk   Falls in the past year? 0 0 0 1 0  Number falls in past yr: 0 0 0 0 0  Injury with Fall? 0 0 0 0   Risk for fall due to : No Fall Risks      Follow up Education provided;Falls prevention discussed        FALL RISK PREVENTION PERTAINING TO THE HOME:  Any stairs in or around the home? yes If so, are there any without handrails? Yes  Home free of loose throw rugs in walkways, pet beds, electrical cords, etc? Yes  Adequate lighting in your home to reduce risk of falls? Yes   ASSISTIVE DEVICES UTILIZED TO PREVENT FALLS:  Life alert? No  Use of a cane, walker or w/c? No  Grab bars in the bathroom? Yes  Shower chair or bench in shower? Yes  Elevated toilet seat or a handicapped toilet? Yes    Cognitive Function:        05/06/2023    8:41 AM  6CIT Screen  What Year? 0 points  What month? 0 points  What time? 0 points  Count back from 20 0 points  Months in reverse 0 points  Repeat phrase 0 points  Total Score 0 points    Immunizations Immunization History  Administered Date(s) Administered   Fluad Quad(high Dose 65+) 10/02/2021   Influenza, High Dose Seasonal PF 09/02/2020   Influenza,inj,Quad PF,6+ Mos 09/17/2015, 09/21/2017   Influenza-Unspecified 10/09/2016, 09/17/2018, 09/23/2019   Moderna Sars-Covid-2 Vaccination 03/13/2020, 04/10/2020, 11/06/2020   PNEUMOCOCCAL CONJUGATE-20 12/23/2022   Pneumococcal Conjugate-13 11/17/2020   Pneumococcal Polysaccharide-23 12/05/2006   Td 05/13/2005   Tdap 01/08/2016, 12/03/2022   Zoster Recombinat (Shingrix) 10/13/2020    TDAP status: Up to date  Flu Vaccine status: Up to date  Pneumococcal vaccine status:  Up to date  Covid-19 vaccine status: Completed vaccines  Qualifies for Shingles Vaccine? Yes   Zostavax completed Yes   Shingrix Completed?: Yes  Screening Tests Health Maintenance  Topic Date Due   OPHTHALMOLOGY EXAM  11/08/2018   COVID-19 Vaccine (4 - 2023-24 season)  07/23/2022   Zoster Vaccines- Shingrix (2 of 2) 06/13/2023 (Originally 12/08/2020)   Colonoscopy  12/24/2023 (Originally 11/09/2020)   INFLUENZA VACCINE  06/23/2023   HEMOGLOBIN A1C  09/02/2023   Diabetic kidney evaluation - eGFR measurement  12/24/2023   Diabetic kidney evaluation - Urine ACR  12/24/2023   FOOT EXAM  03/13/2024   Medicare Annual Wellness (AWV)  05/05/2024   DTaP/Tdap/Td (4 - Td or Tdap) 12/03/2032   Pneumonia Vaccine 38+ Years old  Completed   Hepatitis C Screening  Completed   HPV VACCINES  Aged Out    Health Maintenance  Health Maintenance Due  Topic Date Due   OPHTHALMOLOGY EXAM  11/08/2018   COVID-19 Vaccine (4 - 2023-24 season) 07/23/2022    Colorectal cancer screening: Type of screening: Colonoscopy. Completed yes. Repeat every 5 years  Lung Cancer Screening: (Low Dose CT Chest recommended if Age 10-80 years, 30 pack-year currently smoking OR have quit w/in 15years.) does not qualify.   Lung Cancer Screening Referral: no  Additional Screening:  Hepatitis C Screening: does not qualify; Completed yes  Vision Screening: Recommended annual ophthalmology exams for early detection of glaucoma and other disorders of the eye. Is the patient up to date with their annual eye exam?  Yes  Who is the provider or what is the name of the office in which the patient attends annual eye exams? [facility] If pt is not established with a provider, would they like to be referred to a provider to establish care? No .   Dental Screening: Recommended annual dental exams for proper oral hygiene  Community Resource Referral / Chronic Care Management: CRR required this visit?  No   CCM required this visit?  No      Plan:     I have personally reviewed and noted the following in the patient's chart:   Medical and social history Use of alcohol, tobacco or illicit drugs  Current medications and supplements including opioid prescriptions. Patient is not currently taking  opioid prescriptions. Functional ability and status Nutritional status Physical activity Advanced directives List of other physicians Hospitalizations, surgeries, and ER visits in previous 12 months Vitals Screenings to include cognitive, depression, and falls Referrals and appointments  In addition, I have reviewed and discussed with patient certain preventive protocols, quality metrics, and best practice recommendations. A written personalized care plan for preventive services as well as general preventive health recommendations were provided to patient.     Sue Lush, LPN   1/61/0960   Nurse Notes: Pt talks of the stress around the passing of his parents this year and is to be starting counseling in the next few weeks. He also relays he needs his Ozempic sent to mail order/[facility]. I highlighted on profile. He will be out soon (requesting to be sent today) as [facility] would not fill or send to [facility]. Pt has no other concerns or questions

## 2023-05-06 NOTE — Telephone Encounter (Signed)
Copied from CRM [id number]. Topic: General - Other >> May 05, 2023  1:36 PM Turkey B wrote: Reason for CRM: pt called in states wants to make sure in the future that his Ozempic is sent to the mail order, of [facility] by [employer], [place] - [address] Phone: [phone] Fax: [phone]  I let him we already have a record of the mail order info, and he just has to let us know where he wants meds sent.

## 2023-05-08 DIAGNOSIS — G629 Polyneuropathy, unspecified: Secondary | ICD-10-CM | POA: Diagnosis not present

## 2023-05-10 DIAGNOSIS — G629 Polyneuropathy, unspecified: Secondary | ICD-10-CM | POA: Diagnosis not present

## 2023-05-12 MED ORDER — OZEMPIC (0.25 OR 0.5 MG/DOSE) 2 MG/3ML ~~LOC~~ SOPN: 0.5000 mg | PEN_INJECTOR | SUBCUTANEOUS | 0 refills | Status: DC

## 2023-05-12 NOTE — Addendum Note (Signed)
Addended by: Davene Costain on: 05/12/2023 11:34 AM   Modules accepted: Orders

## 2023-05-12 NOTE — Telephone Encounter (Signed)
Patient called in regards to medication below, he stated that he needs it sent to pharmacy below.   He stated that he has called several times, as it is cheaper to go through the mail order instead of the local pharmacy and per patient's chart it was sent to the local pharmacy instead of the mail order pharmacy that patient previously requested. Patient stated he only has 1 or 2 injections left. Please advise.   Patient's callback #: [phone]    Semaglutide,0.25 or 0.5MG /DOS, (OZEMPIC, 0.25 OR 0.5 MG/DOSE,) 2 MG/3ML SOPN     [facility] [employer] [place], [place] - [address] [place] Phone: [phone]  Fax: [phone]

## 2023-05-17 ENCOUNTER — Other Ambulatory Visit: Payer: Self-pay | Admitting: Internal Medicine

## 2023-05-17 DIAGNOSIS — E1142 Type 2 diabetes mellitus with diabetic polyneuropathy: Secondary | ICD-10-CM

## 2023-05-18 ENCOUNTER — Other Ambulatory Visit: Payer: Self-pay | Admitting: Internal Medicine

## 2023-05-18 ENCOUNTER — Other Ambulatory Visit (HOSPITAL_COMMUNITY): Payer: Self-pay

## 2023-05-18 DIAGNOSIS — E1142 Type 2 diabetes mellitus with diabetic polyneuropathy: Secondary | ICD-10-CM

## 2023-05-18 NOTE — Telephone Encounter (Signed)
Pt called back to report that he wants his medication sent to  [facility] - [place], [place] - [address]  [address] [place] [address]  Phone: [phone] Fax: [phone]    Follow up from missed call from [facility] regarding ozempic

## 2023-05-18 NOTE — Telephone Encounter (Signed)
Unable to refill per protocol, Rx request was refilled 05/12/23, duplicate request.  Requested Prescriptions  Pending Prescriptions Disp Refills   OZEMPIC, 0.25 OR 0.5 MG/DOSE, 2 MG/3ML SOPN [Pharmacy Med Name: OZEMPIC 0.25-0.5 MG/DOSE PEN] 3 mL 0    Sig: Inject 0.5 mg into the skin once a week.     Endocrinology:  Diabetes - GLP-1 Receptor Agonists - semaglutide Failed - 05/17/2023  1:03 PM      Failed - HBA1C in normal range and within 180 days    HB A1C (BAYER DCA - WAIVED)  Date Value Ref Range Status  11/17/2020 9.2 (H) <7.0 % Final    Comment:                                          Diabetic Adult            <7.0                                       Healthy Adult        4.3 - 5.7                                                           (DCCT/NGSP) American Diabetes Association's Summary of Glycemic Recommendations for Adults with Diabetes: Hemoglobin A1c <7.0%. More stringent glycemic goals (A1c <6.0%) may further reduce complications at the cost of increased risk of hypoglycemia.    Hgb A1c MFr Bld  Date Value Ref Range Status  03/03/2023 10.1 (H) 4.8 - 5.6 % Final    Comment:             Prediabetes: 5.7 - 6.4          Diabetes: >6.4          Glycemic control for adults with diabetes: <7.0          Passed - Cr in normal range and within 360 days    Creat  Date Value Ref Range Status  12/23/2022 0.79 0.70 - 1.35 mg/dL Final   Creatinine, Urine  Date Value Ref Range Status  12/23/2022 35 20 - 320 mg/dL Final         Passed - Valid encounter within last 6 months    Recent Outpatient Visits           2 months ago Type 2 diabetes mellitus with diabetic polyneuropathy, without long-term current use of insulin [facility])   [place] [facility] Margarita Mail, DO   4 months ago Type 2 diabetes mellitus with diabetic polyneuropathy, without long-term current use of insulin [facility])   [place] [facility] Margarita Mail, DO   2  years ago Routine general medical examination at a health care facility   [facility], [place] P, DO   2 years ago Diabetic peripheral neuropathy associated with type 2 diabetes mellitus   [place] [facility] [place], Megan P, DO   2 years ago History of COVID-19   [place] [facility] [place], Oralia Rud, DO       Future Appointments  In 3 weeks Margarita Mail, DO [facility], [facility]

## 2023-05-18 NOTE — Telephone Encounter (Signed)
Called pt to verify which pharmacy he would like this med to go to . On 05/12/23 pt requested med to be sent to [facility] order. This request is from Local pharmacy.

## 2023-05-19 NOTE — Telephone Encounter (Signed)
Pls fu with pt re    Semaglutide,0.25 or 0.5MG /DOS, (OZEMPIC, 0.25 OR 0.5 MG/DOSE   0         PT states that he still does not have med, He originally ask to be sent to mail order but it was going to be past time of needing med and would be out before he ever got. He does need form [place] and has confirmed it needs to be sent. This 2nd request denied as too early but he did not get, pls send to [place], call him with any issues at [phone]

## 2023-05-19 NOTE — Telephone Encounter (Signed)
Requested medication (s) are due for refill today: No  Requested medication (s) are on the active medication list: Yes  Last refill:  05/12/23  Future visit scheduled: Yes  Notes to clinic:  Early request.    Requested Prescriptions  Pending Prescriptions Disp Refills   Semaglutide,0.25 or 0.5MG /DOS, (OZEMPIC, 0.25 OR 0.5 MG/DOSE,) 2 MG/3ML SOPN 3 mL 0    Sig: Inject 0.5 mg into the skin once a week.     Endocrinology:  Diabetes - GLP-1 Receptor Agonists - semaglutide Failed - 05/18/2023  2:06 PM      Failed - HBA1C in normal range and within 180 days    HB A1C (BAYER DCA - WAIVED)  Date Value Ref Range Status  11/17/2020 9.2 (H) <7.0 % Final    Comment:                                          Diabetic Adult            <7.0                                       Healthy Adult        4.3 - 5.7                                                           (DCCT/NGSP) American Diabetes Association's Summary of Glycemic Recommendations for Adults with Diabetes: Hemoglobin A1c <7.0%. More stringent glycemic goals (A1c <6.0%) may further reduce complications at the cost of increased risk of hypoglycemia.    Hgb A1c MFr Bld  Date Value Ref Range Status  03/03/2023 10.1 (H) 4.8 - 5.6 % Final    Comment:             Prediabetes: 5.7 - 6.4          Diabetes: >6.4          Glycemic control for adults with diabetes: <7.0          Passed - Cr in normal range and within 360 days    Creat  Date Value Ref Range Status  12/23/2022 0.79 0.70 - 1.35 mg/dL Final   Creatinine, Urine  Date Value Ref Range Status  12/23/2022 35 20 - 320 mg/dL Final         Passed - Valid encounter within last 6 months    Recent Outpatient Visits           2 months ago Type 2 diabetes mellitus with diabetic polyneuropathy, without long-term current use of insulin [facility])   [place] [facility] Margarita Mail, DO   4 months ago Type 2 diabetes mellitus with diabetic polyneuropathy,  without long-term current use of insulin [facility])   [place] [facility] Margarita Mail, DO   2 years ago Routine general medical examination at a health care facility   [facility], [place] P, DO   2 years ago Diabetic peripheral neuropathy associated with type 2 diabetes mellitus   [place] [facility] [place], Megan P, DO   2 years ago  History of COVID-19   [place] [facility] [place], Oralia Rud, DO       Future Appointments             In 3 weeks Margarita Mail, DO [facility], [facility]

## 2023-05-20 ENCOUNTER — Telehealth: Payer: Self-pay | Admitting: Internal Medicine

## 2023-05-20 ENCOUNTER — Other Ambulatory Visit: Payer: Self-pay

## 2023-05-20 DIAGNOSIS — E1142 Type 2 diabetes mellitus with diabetic polyneuropathy: Secondary | ICD-10-CM

## 2023-05-20 MED ORDER — OZEMPIC (0.25 OR 0.5 MG/DOSE) 2 MG/3ML ~~LOC~~ SOPN: 0.5000 mg | PEN_INJECTOR | SUBCUTANEOUS | 0 refills | Status: DC

## 2023-05-20 NOTE — Telephone Encounter (Signed)
Please advice  

## 2023-05-20 NOTE — Telephone Encounter (Signed)
Pt called to report that [facility] does not currently have ozempic in stock. The patient initially said that they could not deliver the medication however now he has clarified that they do have his Rx they just do not have Ozempic in stock. Pt needs this sent to [place] because he is completley out and needs his dose today. He plans to come in to speak to the clinic. Seeking assistance, even samples perhaps.    Best contact: [phone]   [facility] - [place], [place] - [address]  [address] [place] [address]  Phone: [phone] Fax: [phone]

## 2023-05-20 NOTE — Telephone Encounter (Signed)
Pt. Reports mail order does not have Ozempic. Requests be sent to [facility]. Attempted to send through and states "Printed". Please send to [place] for pt.

## 2023-05-20 NOTE — Telephone Encounter (Signed)
Resent to [facility], Mail order pharmacy out of medication.

## 2023-05-23 ENCOUNTER — Other Ambulatory Visit: Payer: Self-pay | Admitting: Nurse Practitioner

## 2023-05-23 MED ORDER — OZEMPIC (0.25 OR 0.5 MG/DOSE) 2 MG/3ML ~~LOC~~ SOPN: 0.5000 mg | PEN_INJECTOR | SUBCUTANEOUS | 0 refills | Status: DC

## 2023-05-23 NOTE — Telephone Encounter (Signed)
The patient called back in checking on the status of his Ozempic and to make sure it was called in to [facility]. Previous message was addressed to [place]. Please assist patient further

## 2023-05-23 NOTE — Addendum Note (Signed)
Addended by: Davene Costain on: 05/23/2023 03:31 PM   Modules accepted: Orders

## 2023-06-02 ENCOUNTER — Encounter: Payer: PPO | Attending: Physical Medicine and Rehabilitation | Admitting: Physical Medicine and Rehabilitation

## 2023-06-02 ENCOUNTER — Encounter: Payer: Self-pay | Admitting: Physical Medicine and Rehabilitation

## 2023-06-02 VITALS — BP 131/82 | HR 70 | Wt 230.0 lb

## 2023-06-02 DIAGNOSIS — I10 Essential (primary) hypertension: Secondary | ICD-10-CM | POA: Diagnosis not present

## 2023-06-02 DIAGNOSIS — E118 Type 2 diabetes mellitus with unspecified complications: Secondary | ICD-10-CM

## 2023-06-02 DIAGNOSIS — E559 Vitamin D deficiency, unspecified: Secondary | ICD-10-CM

## 2023-06-02 DIAGNOSIS — E1142 Type 2 diabetes mellitus with diabetic polyneuropathy: Secondary | ICD-10-CM

## 2023-06-02 DIAGNOSIS — Z794 Long term (current) use of insulin: Secondary | ICD-10-CM | POA: Diagnosis not present

## 2023-06-02 MED ORDER — CAPSAICIN-CLEANSING GEL 8 % EX KIT
4.0000 | PACK | Freq: Once | CUTANEOUS | Status: AC
  Administered 2023-06-02: 4 via TOPICAL

## 2023-06-02 NOTE — Progress Notes (Signed)
Subjective:    Patient ID: Casey Hunter., male    DOB: 09/25/1956, 67 y.o.   MRN: [id number]  HPI   Mr. Bodi returns for f/u LONG COVID, Vitamin D deficiency, and HLD, and type 2 diabetes  1) Long COVID symptoms -eating oysters, salmon, liver once per week -still has some fatigue -left leg still falls asleep- ready to repeat Qutenza today -some nights it can be pretty bad -it is not usually painful -he is trying to work on his diet. -he is taking the Lyrica two at night before sleeping -he has taken some ivermectin-was taking it every day when he felt bad.  -he asks about taking Pepcid and Claritin -does feel much better -feels like he is getting enough protein -he does not eat much citrus fruits due to his diabetes.  -he loves to eat strawberries and blackberries -checked testosterone level and discussed that his levels  -discussed vitamin D level and high dose supplementation -lot of stress since March 1st since his dad felt and broke his hip  2) Diabetic peripheral neuropathy: -has been trying to give up bread and potatoes and all his sweets -both feet feel cold -Qutenza helps, would like to try again today -hemoglobin A1c 7.8 on 9/15, increased to 9.9 in January, and 10.1 on recent check -he has been very stressed by his parents' health -ready to repeat Qutenza today -CBGs better controlled- less than 150s -eating 1 egg at breakfast and drinks unsweet tea -weight is 248 lbs today -discussed his diet thoroughly.  -got 60% improvement with Qutenza first time, even better improvement this last time but he can't quantify it.  -restarted Trulicity -Ozempic was denied for him.  -pain is mostly present on soles of both feet- sometimes in his toes -tolerated last session well without any burning -discussed his hemoglobin A1c  3) BPH: -the tamsulosin has been helping him. -only urinating twice per night  4) Fatigue -Contnues to have severe fatigue at times.   -yesterday was a bad tay -panning to return in August.   5) HTN: -BP is 131/82 -has been well controlled -now off all blood pressure medications except for flomax 0.4mg .  -he has been checking BP at home and it has been stable.  -he likes cinnamon  6) Raynaud's syndrome: -the nifedipine helped his upper extremity neuropathy but his lower extremities are still very cold at night -he continues to take this  7) History of multiple acute COVID infections -he asks why he is getting these infections every year -he is feeling better than when he called me on Saturday -he asks for how long he should quarantine.   8) Vitamin D deficiency -discussed that level has improved to 40s, and has remained stable there since we checked 4 months ago -discussed that this was an improvement from the first time we checked -prescribed refill for 20 weeks of weekly high dose supplement -would like to repeat today  9) elevated triglycerides -recommended avoiding processed fodos -repatha was not covered and he would like to see   10) Accident on Friday -large piece of wood hit him in his face.  -broke his nose -had 6 stitches -really scared him  He continues to have numbness in his left calf. It is better than initially. His left hand will also sleep. It hurts but he is not sure if this is pain.  He has been having increase readings on his Freestyle Libre device- around 200s. For breakfast he eats eggs and sausage.  For lunch he eats barebcue and sometimes french fries. Once in a while he eats sugar. He drinks water and unsweet tea and a fruit drink. He eats two bananas every day.   He has been checking pressures every day and they have been well controlled. He takes his BP medication 5 days per week.  Prior history: Dizziness is better, but still present at times. He has brought with him an excellent log of his AM and PM blood pressures, which are excellent off any blood pressure medications.    Obesity: He has made excellent changes to his diet. He has lost 2 lbs since last visit (currently 266 lbs). He has been eating one banana per day, eggs daily, salmon once per week, meat, and a lot of nuts. He feels he still needs to minimize sweets.   Energy: Has greatly improved but is still not at the level required for his work. He has weaned off the Ritalin. Discussed goal of restarting work on October 11th for three days per week and he is agreeable.   Reviewed PCP note and he has been doing much better. HbA1c has improved to 7.7  Left leg numbness continues but is slightly improved.     Pain Inventory Average Pain 4 Pain Right Now 7 My pain is intermittent, constant, and tingling  In the last 24 hours, has pain interfered with the following? General activity 4 Relation with others 2 Enjoyment of life 5 What TIME of day is your pain at its worst? varies Sleep (in general) Fair  Pain is worse with: walking, bending, and some activites Pain improves with: medication Relief from Meds: 5  Family History  Problem Relation Age of Onset   Hypertension Mother    Hyperlipidemia Mother    Hyperlipidemia Father    Social History   Socioeconomic History   Marital status: Divorced    Spouse name: Not on file   Number of children: 2   Years of education: 12   Highest education level: High school graduate  Occupational History   Not on file  Tobacco Use   Smoking status: Never   Smokeless tobacco: Never  Vaping Use   Vaping status: Never Used  Substance and Sexual Activity   Alcohol use: No    Alcohol/week: 0.0 standard drinks of alcohol   Drug use: No   Sexual activity: Not Currently  Other Topics Concern   Not on file  Social History Narrative   Lives alone   Social Determinants of Health   Financial Resource Strain: Low Risk  (05/06/2023)   Overall Financial Resource Strain (CARDIA)    Difficulty of Paying Living Expenses: Not hard at all  Food Insecurity:  Unknown (05/06/2023)   Hunger Vital Sign    Worried About Running Out of Food in the Last Year: Never true    Ran Out of Food in the Last Year: Not on file  Transportation Needs: No Transportation Needs (05/06/2023)   PRAPARE - Administrator, Civil Service (Medical): No    Lack of Transportation (Non-Medical): No  Physical Activity: Sufficiently Active (05/06/2023)   Exercise Vital Sign    Days of Exercise per Week: 5 days    Minutes of Exercise per Session: 60 min  Stress: Stress Concern Present (05/06/2023)   [employer] of Occupational Health - Occupational Stress Questionnaire    Feeling of Stress : Rather much  Social Connections: Moderately Isolated (05/06/2023)   Social Connection and Isolation Panel [NHANES]  Frequency of Communication with Friends and Family: Twice a week    Frequency of Social Gatherings with Friends and Family: Three times a week    Attends Religious Services: Never    Active Member of Clubs or Organizations: Not on file    Attends Banker Meetings: More than 4 times per year    Marital Status: Divorced   Past Surgical History:  Procedure Laterality Date   HERNIA REPAIR     Past Surgical History:  Procedure Laterality Date   HERNIA REPAIR     Past Medical History:  Diagnosis Date   Diabetic peripheral neuropathy associated with type 2 diabetes mellitus 03/14/2017   DOE (dyspnea on exertion) 11/2019   Onset jan 2021 with covid 19  - assoc with atypical cp developed during the infection present mostly sitting/ absent supine/ better with deep breathing/ no worse with ex - 01/30/2020   Walked RA x two laps =  approx 542ft @ brisk pace - stopped due to end of study, no sob/ no change cp  with sats of 93 % at the end of the study and no acute ekg changes  - Echo  02/13/2020  wnl with  no pericadial ef   Fatigue 11/2019   History of COVID-19 11/2019   Hyperlipidemia associated with type 2 diabetes mellitus 09/16/2015   Hypertension  associated with type 2 diabetes mellitus 09/16/2015   Knee osteoarthritis 04/14/2016   Mild neurocognitive disorder 06/12/2020   Numbness of lower extremity 11/2019   Attributed to COVID-19   Sleep apnea    Patient denied being diagnosed with OSA in the past   BP 131/82   Pulse 70   Wt 230 lb (104.3 kg)   SpO2 95%   BMI 29.53 kg/m   Opioid Risk Score:   Fall Risk Score:  `1  Depression screen [facility] 2/9     06/02/2023    9:43 AM 05/06/2023    8:27 AM 03/14/2023    1:15 PM 03/03/2023    9:22 AM 12/23/2022    8:51 AM 02/11/2022    8:53 AM 01/29/2022    8:02 AM  Depression screen PHQ 2/9  Decreased Interest 0 0 0 0 0 0 0  Down, Depressed, Hopeless 0 0 0 0 0 0 0  PHQ - 2 Score 0 0 0 0 0 0 0  Altered sleeping  0 0  0    Tired, decreased energy   0  0    Change in appetite   0  0    Feeling bad or failure about yourself    0  0    Trouble concentrating   0  0    Moving slowly or fidgety/restless   0  0    Suicidal thoughts   0  0    PHQ-9 Score  0 0  0    Difficult doing work/chores   Not difficult at all  Not difficult at all     Review of Systems  Constitutional: Negative.   HENT: Negative.    Eyes: Negative.   Respiratory: Negative.    Cardiovascular: Negative.   Gastrointestinal: Negative.   Endocrine: Negative.   Genitourinary: Negative.   Musculoskeletal:  Positive for gait problem.       Left shoulder pain , right and left knee pain , left leg pain  Skin: Negative.   Allergic/Immunologic: Negative.   Hematological: Negative.   Psychiatric/Behavioral: Negative.         Objective:  Physical Exam Gen: no distress, normal appearing, BMI 29.53, weight 230 lbs, BP 131/82 HEENT: oral mucosa pink and moist, NCAT Cardio: Reg rate Chest: normal effort, normal rate of breathing Abd: soft, non-distended Ext: no edema Psych: pleasant, normal affect Skin: intact, no open lesions Neuro: Alert and oriented x3     Assessment & Plan:  Mr. Gutridge is a 67 year old man who  presents for follow-up on LONG COVID symptoms.   1) HTN: Improved control. Discontinue Losartan -continue flomax -continue eating a banana every day -continue grapefruit at least once per week.  -Advised checking BP daily at home and logging results to bring into follow-up appointment with her PCP and myself. -Reviewed BP meds today.  -Advised regarding healthy foods that can help lower blood pressure and provided with a list: 1) citrus foods- high in vitamins and minerals 2) salmon and other fatty fish - reduces inflammation and oxylipins 3) swiss chard (leafy green)- high level of nitrates 4) pumpkin seeds- one of the best natural sources of magnesium 5) Beans and lentils- high in fiber, magnesium, and potassium 6) Berries- high in flavonoids 7) Amaranth (whole grain, can be cooked similarly to rice and oats)- high in magnesium and fiber 8) Pistachios- even more effective at reducing BP than other nuts 9) Carrots- high in phenolic compounds that relax blood vessels and reduce inflammation 10) Celery- contain phthalides that relax tissues of arterial walls 11) Tomatoes- can also improve cholesterol and reduce risk of heart disease 12) Broccoli- good source of magnesium, calcium, and potassium 13) Greek yogurt: high in potassium and calcium 14) Herbs and spices: Celery seed, cilantro, saffron, lemongrass, black cumin, ginseng, cinnamon, cardamom, sweet basil, and ginger 15) Chia and flax seeds- also help to lower cholesterol and blood sugar 16) Beets- high levels of nitrates that relax blood vessels  17) spinach and bananas- high in potassium  -Provided lise of supplements that can help with hypertension:  1) magnesium: one high quality brand is Bioptemizers since it contains all 7 types of magnesium, otherwise over the counter magnesium gluconate 400mg  is a good option 2) B vitamins 3) vitamin D 4) potassium 5) CoQ10 6) L-arginine 7) Vitamin C 8) Beetroot -Educated that goal BP  is 120/80. -Made goal to incorporate some of the above foods into diet.     2) Fatigue: Improved but still present. Continue NAC 600mg  BID and gave list of foods that increase NAC. Off Ritalin. Increase work to 4 days per week until re-eval on April 1st. Provided with note. This is one of the reason he feels he needs to retire.   3) Vitamin D deficiency -refilled ergocalciferol 50,000U once per week for 7 weeks -check vitamin D level today  4) Leg numbness: Persists, but improved. Explained etiologies of nerve injury 2/2 COVID-19 vs. Diabetic neuropathy.   5) Obesity: Weight 230, lost 42 lbs in the past year- He eats salmon once per week, 1 egg daily, nuts, meat, olive oil. Made goal to avoid sugar.  -Discussed the benefits of intermittent fasting. Recommended starting with pushing dinner 15 minutes earlier and when this feels easy, continuing to push dinner 15 minutes earlier. Discussed that this can help her body to improve its ability to burn fat rather than glucose, improving insulin sensitivity. Recommended drinking Roobois tea in the evening to help curb appetite and for its numerous health benefits.   -Educated regarding health benefits of weight loss- for pain, general health, chronic disease prevention, immune health, mental health.  -Will  monitor weight every visit.  -Consider Roobois tea daily.  -Discussed the benefits of intermittent fasting. -Discussed foods that can assist in weight loss: 1) leafy greens- high in fiber and nutrients 2) dark chocolate- improves metabolism (if prefer sweetened, best to sweeten with honey instead of sugar).  3) cruciferous vegetables- high in fiber and protein 4) full fat yogurt: high in healthy fat, protein, calcium, and probiotics 5) apples- high in a variety of phytochemicals 6) nuts- high in fiber and protein that increase feelings of fullness 7) grapefruit: rich in nutrients, antioxidants, and fiber (not to be taken with anticoagulation) 8)  beans- high in protein and fiber 9) salmon- has high quality protein and healthy fats 10) green tea- rich in polyphenols 11) eggs- rich in choline and vitamin D 12) tuna- high protein, boosts metabolism 13) avocado- decreases visceral abdominal fat 14) chicken (pasture raised): high in protein and iron 15) blueberries- reduce abdominal fat and cholesterol 16) whole grains- decreases calories retained during digestion, speeds metabolism 17) chia seeds- curb appetite 18) chilies- increases fat metabolism   6) DM 2:  Discussed HgbA1c worsening to 10.1, repeat HgbA1c today -discussed dietary changes -discussed that stress, lack of sleep, and recently worsening diet could have contributed -encouraging trying to find an aide to help with his mother's care so he can sleep at night -restarted metformin 500mg  daily.  -check CBGs daily, log, and bring log to follow-up appointment -avoid sugar, bread, pasta, rice -avoid snacking -Recommended 1 glass water with 1 TB apple cider vinegar before meals to reduce CBG spike, has additional health benefits, drink with straw to protect enamel.   -prescribed Trulicity -perform daily foot exam and at least annual eye exam -try to incorporate into your diet some of the following foods which are good for diabetes: 1) cinnamon- imitates effects of insulin, increasing glucose transport into cells ([place] cinnamon is best, least processed) 2) nuts- can slow down the blood sugar response of carbohydrate rich foods 3) oatmeal- contains and anti-inflammatory compound avenanthramide 4) whole-milk yogurt (best types are no sugar, [place] yogurt, or goat/sheep yogurt) 5) beans- high in protein, fiber, and vitamins, low glycemic index 6) broccoli- great source of vitamin A and C 7) quinoa- higher in protein and fiber than other grains 8) spinach- high in vitamin A, fiber, and protein 9) olive oil- reduces glucose levels, LDL, and triglycerides 10)  salmon- excellent amount of omega-3-fatty acids 11) walnuts- rich in antioxidants 12) apples- high in fiber and quercetin 13) carrots- highly nutritious with low impact on blood sugar 14) eggs- improve HDL (good cholesterol), high in protein, keep you satiated 15) turmeric: improves blood sugars, cardiovascular disease, and protects kidney health 16) garlic: improves blood sugar, blood pressure, pain 17) tomatoes: highly nutritious with low impact on blood sugar   7) HLD -really improved on last labs! -continue Rapatha -discussed results of 10/5 lipid panel which is within normal limits except for elevated triglycerides.   8) Diabetic peripheral neuropathy Refilled Lyrica -discussed that he has been trying to cut out sweets, bread, and potatoes -discussed positive response to Qutenza -Discussed Qutenza as an option for neuropathic pain control. Discussed that this is a capsaicin patch, stronger than capsaicin cream. Discussed that it is currently approved for diabetic peripheral neuropathy and post-herpetic neuralgia, but that it has also shown benefit in treating other forms of neuropathy. Provided patient with link to site to learn more about the patch: https://www.clark.biz/. Discussed that the patch would be placed in office and  benefits usually last 3 months. Discussed that unintended exposure to capsaicin can cause severe irritation of eyes, mucous membranes, respiratory tract, and skin, but that Qutenza is a local treatment and does not have the systemic side effects of other nerve medications. Discussed that there may be pain, itching, erythema, and decreased sensory function associated with the application of Qutenza. Side effects usually subside within 1 week. A cold pack of analgesic medications can help with these side effects. Blood pressure can also be increased due to pain associated with administration of the patch.   Prescribed Zynex Nexwave  4 patches of Qutenza was applied  to the area of pain. Ice packs were applied during the procedure to ensure patient comfort. Blood pressure was monitored every 15 minutes. The patient tolerated the procedure well. Post-procedure instructions were given and follow-up has been scheduled.    9) BPH: -continue flomax to 0.8mg  HS    10) Decreased muscle mass, brain fog, decreased strength: -improving  11) Elevated PSA -normalized on 11/22 and 10/6 labs  12) testosterone deficiency: -normalized with below interventions to 420 on 10/6 -will check testosterone next visit.  -encouraged vitamin D supplementation, resistance training, high protein/low carb diet.  -discussed that this has normalized.  -check testosterone today -continue liver once in a while for nutritional benefits.   13) History of multiple acute COVID infections -prescribed pepcid on Saturday and patient is now feeling better -discussed at least 5 day quarantine and until symptoms resolve -discussed his ivermectin use -discussed foods rich in vitamin C and zinc to help boost immune system to reduce severe infections in the future -discussed risks and benefits of COVID vaccines  14) Cataracts -discussed plan for corrective lessons.  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   17) Grief -discussed his grief from his parents' death -discussed that his mother's dementia really impacted him  General health: He has received Shingles and pneumococcal vaccines, early flu shot, and 2nd COVID booster.   40 minutes spent in discussion of risks and benefits of Qutenza and obtaining informed consent, discussion of q90 day follow-up and expectation of improvement in pain with each repeat application, discussed that he was following with a therapist and one of her concerns is that he is alone, discussed that his parents' death  have really affected him, discussed his dietary changes, repeating HgbA1c today, discussed that his mother's dementia relally affected him, discussed that he has tried to cut out sweets, potatoes, and bread, discussed that he found a great lady online

## 2023-06-03 LAB — HEMOGLOBIN A1C
Est. average glucose Bld gHb Est-mCnc: 180 mg/dL
Hgb A1c MFr Bld: 7.9 % — ABNORMAL HIGH (ref 4.8–5.6)

## 2023-06-03 LAB — VITAMIN D 25 HYDROXY (VIT D DEFICIENCY, FRACTURES): Vit D, 25-Hydroxy: 68.8 ng/mL (ref 30.0–100.0)

## 2023-06-07 DIAGNOSIS — G629 Polyneuropathy, unspecified: Secondary | ICD-10-CM | POA: Diagnosis not present

## 2023-06-09 DIAGNOSIS — G629 Polyneuropathy, unspecified: Secondary | ICD-10-CM | POA: Diagnosis not present

## 2023-06-13 NOTE — Progress Notes (Unsigned)
Established Patient Office Visit  Subjective    Patient ID: Casey Guandique., male    DOB: 18-Sep-1956  Age: 67 y.o. MRN: [id number]  CC:  No chief complaint on file.   HPI Casey Hunter. presents to follow up on chronic medical conditions. Following with Dr. Carlis Abbott PMR in [place].   History of HTN: had been on 5 different medications but since he had COVID his blood pressure has been much better controlled.   HLD: -Medications: Repatha 140 mg every 14 days, aspirin - statin intolerance. 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  -Patient is compliant with above medications and reports no side effects.  -Last lipid panel: Lipid Panel -     Component Value Date/Time   CHOL 264 (H) 12/02/2022 1149   CHOL 150 04/24/2018 0820   CHOL 166 11/22/2012 0903   TRIG 182 (H) 12/02/2022 1149   TRIG 174 (H) 04/24/2018 0820   TRIG 97 11/22/2012 0903   HDL 48 12/02/2022 1149   HDL 40 11/22/2012 0903   CHOLHDL 5.5 (H) 12/02/2022 1149   CHOLHDL 4 04/24/2021 0945   VLDL 29.2 04/24/2021 0945   VLDL 35 (H) 04/24/2018 0820   VLDL 19 11/22/2012 0903   LDLCALC 182 (H) 12/02/2022 1149   LDLCALC 107 (H) 11/22/2012 0903   LABVLDL 34 12/02/2022 1149    Diabetes, Type 2 with Neuropathy: -Last A1c 7/24 7.9% -Medications: Trulicity 4.5 mg weekly (not available ?), Farxiga 10 mg, Metformin 1000 mg twice daily. Also on Lyrica 75 mg TID, Nifedipine 30 mg for numbness and tingling. PMR does Capsaicin patches for him as well.  -Patient is compliant with the above medications and reports no side effects.  -Checking BG at home: fasting: 120-180, this morning 201  -Eye exam: UTD 12/23 -Foot exam: UTD 4/24 -Microalbumin: UTD 2/24 -Statin: Statin intolerance - on Repatha -PNA vaccine: UTD  -Denies symptoms of hypoglycemia, polyuria, polydipsia, numbness extremities, foot ulcers/trauma.    BPH: -Currently on Flomax 0.8 mg, symptoms fairly well controlled   Vitamin D Deficiency: -Currently on 50,000 IU weekly - has been on this dose about 1 year -Last Vitamin D level 43 on 12/02/22  Health Maintenance: -Blood work UTD -Colonoscopy 2011, repeat in 10 years, due but patient would like to postpone     Outpatient Encounter Medications as of 06/14/2023  Medication Sig   acetaminophen (TYLENOL) 650 MG CR tablet Take 1,300 mg by mouth as needed.   aspirin 81 MG tablet Take 81 mg by mouth daily.   Continuous Blood Gluc Receiver (FREESTYLE LIBRE 2 READER) DEVI Apply 1 Units topically daily. Use as directed.   Continuous Blood Gluc Sensor (FREESTYLE LIBRE 2 SENSOR) MISC Apply 1 Units topically daily. Use as directed.   dapagliflozin propanediol (FARXIGA) 10 MG TABS tablet Take 1 tablet (10 mg total) by mouth daily.   Evolocumab (REPATHA SURECLICK) 140 MG/ML SOAJ Inject 140 mg into the skin every 14 (fourteen) days. Has failed statins, Zetia, and Niacin   meloxicam (MOBIC) 15 MG tablet TAKE 1 TABLET BY MOUTH DAILY AS  NEEDED FOR PAIN   metFORMIN (GLUCOPHAGE) 1000 MG tablet Take 1 tablet (1,000 mg total) by mouth 2 (two) times daily with a meal.   NIFEdipine (PROCARDIA-XL/NIFEDICAL-XL) 30 MG 24 hr tablet Take 1 tablet (30 mg total) by mouth daily.   pregabalin (LYRICA) 75 MG capsule Take 1  capsule (75 mg total) by mouth 3 (three) times daily.   Semaglutide,0.25 or 0.5MG /DOS, (OZEMPIC, 0.25 OR 0.5 MG/DOSE,) 2 MG/3ML SOPN Inject 0.5 mg into the skin once a week.   tamsulosin (FLOMAX) 0.4 MG CAPS capsule Take 2 capsules (0.8 mg total) by mouth daily after supper.   Vitamin D, Ergocalciferol, (DRISDOL) 1.25 MG (50000 UNIT) CAPS capsule Take 1 capsule (50,000 Units total) by mouth every 7 (seven) days.   No facility-administered encounter medications on file as of 06/14/2023.    Past Medical History:  Diagnosis Date   Diabetic peripheral neuropathy associated with type 2 diabetes  mellitus 03/14/2017   DOE (dyspnea on exertion) 11/2019   Onset jan 2021 with covid 19  - assoc with atypical cp developed during the infection present mostly sitting/ absent supine/ better with deep breathing/ no worse with ex - 01/30/2020   Walked RA x two laps =  approx 547ft @ brisk pace - stopped due to end of study, no sob/ no change cp  with sats of 93 % at the end of the study and no acute ekg changes  - Echo  02/13/2020  wnl with  no pericadial ef   Fatigue 11/2019   History of COVID-19 11/2019   Hyperlipidemia associated with type 2 diabetes mellitus 09/16/2015   Hypertension associated with type 2 diabetes mellitus 09/16/2015   Knee osteoarthritis 04/14/2016   Mild neurocognitive disorder 06/12/2020   Numbness of lower extremity 11/2019   Attributed to COVID-19   Sleep apnea    Patient denied being diagnosed with OSA in the past    Past Surgical History:  Procedure Laterality Date   HERNIA REPAIR      Family History  Problem Relation Age of Onset   Hypertension Mother    Hyperlipidemia Mother    Hyperlipidemia Father     Social History   Socioeconomic History   Marital status: Divorced    Spouse name: Not on file   Number of children: 2   Years of education: 12   Highest education level: High school graduate  Occupational History   Not on file  Tobacco Use   Smoking status: Never   Smokeless tobacco: Never  Vaping Use   Vaping status: Never Used  Substance and Sexual Activity   Alcohol use: No    Alcohol/week: 0.0 standard drinks of alcohol   Drug use: No   Sexual activity: Not Currently  Other Topics Concern   Not on file  Social History Narrative   Lives alone   Social Determinants of Health   Financial Resource Strain: Low Risk  (05/06/2023)   Overall Financial Resource Strain (CARDIA)    Difficulty of Paying Living Expenses: Not hard at all  Food Insecurity: Unknown (05/06/2023)   Hunger Vital Sign    Worried About Running Out of Food in the Last  Year: Never true    Ran Out of Food in the Last Year: Not on file  Transportation Needs: No Transportation Needs (05/06/2023)   PRAPARE - Administrator, Civil Service (Medical): No    Lack of Transportation (Non-Medical): No  Physical Activity: Sufficiently Active (05/06/2023)   Exercise Vital Sign    Days of Exercise per Week: 5 days    Minutes of Exercise per Session: 60 min  Stress: Stress Concern Present (05/06/2023)   [employer] of Occupational Health - Occupational Stress Questionnaire    Feeling of Stress : Rather much  Social Connections: Moderately Isolated (05/06/2023)  Social Connection and Isolation Panel [NHANES]    Frequency of Communication with Friends and Family: Twice a week    Frequency of Social Gatherings with Friends and Family: Three times a week    Attends Religious Services: Never    Active Member of Clubs or Organizations: Not on file    Attends Club or Organization Meetings: More than 4 times per year    Marital Status: Divorced  Intimate Partner Violence: Not At Risk (05/06/2023)   Humiliation, Afraid, Rape, and Kick questionnaire    Fear of Current or Ex-Partner: No    Emotionally Abused: No    Physically Abused: No    Sexually Abused: No    Review of Systems  Constitutional:  Negative for chills and fever.  Eyes:  Negative for blurred vision.  Respiratory:  Negative for shortness of breath.   Cardiovascular:  Negative for chest pain.        Objective    There were no vitals taken for this visit.  Physical Exam Constitutional:      Appearance: Normal appearance.  HENT:     Head: Normocephalic and atraumatic.  Eyes:     Conjunctiva/sclera: Conjunctivae normal.  Cardiovascular:     Rate and Rhythm: Normal rate and regular rhythm.     Pulses:          Dorsalis pedis pulses are 2+ on the right side and 2+ on the left side.  Pulmonary:     Effort: Pulmonary effort is normal.     Breath sounds: Normal breath sounds.   Musculoskeletal:     Right lower leg: No edema.     Left lower leg: No edema.     Right foot: Normal range of motion. No deformity, bunion, Charcot foot, foot drop or prominent metatarsal heads.     Left foot: Normal range of motion. No deformity, bunion, Charcot foot, foot drop or prominent metatarsal heads.  Feet:     Right foot:     Protective Sensation: 6 sites tested.  6 sites sensed.     Skin integrity: Skin integrity normal.     Toenail Condition: Fungal disease present.    Left foot:     Protective Sensation: 6 sites tested.  6 sites sensed.     Skin integrity: Skin integrity normal.     Toenail Condition: Fungal disease present. Skin:    General: Skin is warm and dry.  Neurological:     General: No focal deficit present.     Mental Status: He is alert. Mental status is at baseline.  Psychiatric:        Mood and Affect: Mood normal.        Behavior: Behavior normal.         Assessment & Plan:   1. Type 2 diabetes mellitus with diabetic polyneuropathy, without long-term current use of insulin: A1c increased to 10.1% earlier this month. Patient has had a lot of personal stress - his father passed away in Jan 17, 2023 and his mother has dementia and is currently in the hospital with plans to come home with hospice. Discussed diabetes is uncontrolled and we briefly discussed starting a long acting insulin. Patient would like to double check Metformin dose and work on lifestyle and diet and recheck A1c in 3 months. If the same or higher will initiate long acting insulin at that time. Continue Metformin 1000 mg BID (refilled), Farxiga 10 mg. Tried to refill Trulicity but is on back order (addend 04/19/23) and I will prescribe  low dose Ozempic instead until Trulicity becomes more readily available. Consider something like Prandin or Actos. Foot exam today. Follow up in 3 months.   - [facility] Diabetes Foot Exam - metFORMIN (GLUCOPHAGE) 1000 MG tablet; Take 1 tablet (1,000 mg total) by mouth 2  (two) times daily with a meal.  Dispense: 180 tablet; Refill: 3   No follow-ups on file.   Margarita Mail, DO

## 2023-06-14 ENCOUNTER — Other Ambulatory Visit: Payer: Self-pay

## 2023-06-14 ENCOUNTER — Encounter: Payer: Self-pay | Admitting: Internal Medicine

## 2023-06-14 ENCOUNTER — Ambulatory Visit (INDEPENDENT_AMBULATORY_CARE_PROVIDER_SITE_OTHER): Payer: PPO | Admitting: Internal Medicine

## 2023-06-14 VITALS — BP 122/80 | HR 90 | Temp 97.6°F | Resp 16 | Ht 73.0 in | Wt 229.0 lb

## 2023-06-14 DIAGNOSIS — T466X5A Adverse effect of antihyperlipidemic and antiarteriosclerotic drugs, initial encounter: Secondary | ICD-10-CM | POA: Diagnosis not present

## 2023-06-14 DIAGNOSIS — E1169 Type 2 diabetes mellitus with other specified complication: Secondary | ICD-10-CM | POA: Diagnosis not present

## 2023-06-14 DIAGNOSIS — E1142 Type 2 diabetes mellitus with diabetic polyneuropathy: Secondary | ICD-10-CM | POA: Diagnosis not present

## 2023-06-14 DIAGNOSIS — Z1211 Encounter for screening for malignant neoplasm of colon: Secondary | ICD-10-CM | POA: Diagnosis not present

## 2023-06-14 DIAGNOSIS — G720 Drug-induced myopathy: Secondary | ICD-10-CM | POA: Diagnosis not present

## 2023-06-14 DIAGNOSIS — E785 Hyperlipidemia, unspecified: Secondary | ICD-10-CM

## 2023-06-14 DIAGNOSIS — R112 Nausea with vomiting, unspecified: Secondary | ICD-10-CM | POA: Diagnosis not present

## 2023-06-14 MED ORDER — OZEMPIC (0.25 OR 0.5 MG/DOSE) 2 MG/3ML ~~LOC~~ SOPN
0.5000 mg | PEN_INJECTOR | SUBCUTANEOUS | 0 refills | Status: AC
  Filled 2023-06-14: qty 9, 84d supply, fill #0

## 2023-06-14 MED ORDER — PANTOPRAZOLE SODIUM 40 MG PO TBEC
40.0000 mg | DELAYED_RELEASE_TABLET | Freq: Every day | ORAL | 0 refills | Status: DC
  Filled 2023-06-14: qty 90, 90d supply, fill #0

## 2023-06-16 ENCOUNTER — Telehealth: Payer: Self-pay

## 2023-06-16 ENCOUNTER — Other Ambulatory Visit: Payer: Self-pay

## 2023-06-16 DIAGNOSIS — Z1211 Encounter for screening for malignant neoplasm of colon: Secondary | ICD-10-CM

## 2023-06-16 MED ORDER — NA SULFATE-K SULFATE-MG SULF 17.5-3.13-1.6 GM/177ML PO SOLN: 1.0000 | Freq: Once | ORAL | 0 refills | Status: AC

## 2023-06-16 NOTE — Telephone Encounter (Signed)
Gastroenterology Pre-Procedure Review  Request Date: 10/31/23 Requesting Physician: Dr. Tobi Bastos  PATIENT REVIEW QUESTIONS: The patient responded to the following health history questions as indicated:    1. Are you having any GI issues? no 2. Do you have a personal history of Polyps? NO 3. Do you have a family history of Colon Cancer or Polyps? yes (dad colon polyps) 4. Diabetes Mellitus?YES has been advised and noted on instructions stop ozempic 7 days prior to colonoscopy, stop farxiga 3 days prior to colonoscopy, stop metformin 2 days prior to colonoscopy. 5. Joint replacements in the past 12 months?no 6. Major health problems in the past 3 months?no 7. Any artificial heart valves, MVP, or defibrillator?no    MEDICATIONS & ALLERGIES:    Patient reports the following regarding taking any anticoagulation/antiplatelet therapy:   Plavix, Coumadin, Eliquis, Xarelto, Lovenox, Pradaxa, Brilinta, or Effient? no Aspirin? yes (81 MG)  Patient confirms/reports the following medications:  Current Outpatient Medications  Medication Sig Dispense Refill   acetaminophen (TYLENOL) 650 MG CR tablet Take 1,300 mg by mouth as needed.     aspirin 81 MG tablet Take 81 mg by mouth daily.     Continuous Blood Gluc Receiver (FREESTYLE LIBRE 2 READER) DEVI Apply 1 Units topically daily. Use as directed. 1 each 3   Continuous Blood Gluc Sensor (FREESTYLE LIBRE 2 SENSOR) MISC Apply 1 Units topically daily. Use as directed. 6 each 3   dapagliflozin propanediol (FARXIGA) 10 MG TABS tablet Take 1 tablet (10 mg total) by mouth daily. 90 tablet 3   Evolocumab (REPATHA SURECLICK) 140 MG/ML SOAJ Inject 140 mg into the skin every 14 (fourteen) days. Has failed statins, Zetia, and Niacin 6 mL 3   meloxicam (MOBIC) 15 MG tablet TAKE 1 TABLET BY MOUTH DAILY AS  NEEDED FOR PAIN 90 tablet 3   metFORMIN (GLUCOPHAGE) 1000 MG tablet Take 1 tablet (1,000 mg total) by mouth 2 (two) times daily with a meal. 180 tablet 3    NIFEdipine (PROCARDIA-XL/NIFEDICAL-XL) 30 MG 24 hr tablet Take 1 tablet (30 mg total) by mouth daily. 90 tablet 3   pantoprazole (PROTONIX) 40 MG tablet Take 1 tablet (40 mg total) by mouth daily. 90 tablet 0   pregabalin (LYRICA) 75 MG capsule Take 1 capsule (75 mg total) by mouth 3 (three) times daily. 270 capsule 3   Semaglutide,0.25 or 0.5MG /DOS, (OZEMPIC, 0.25 OR 0.5 MG/DOSE,) 2 MG/3ML SOPN Inject 0.5 mg into the skin once a week. 9 mL 0   tamsulosin (FLOMAX) 0.4 MG CAPS capsule Take 2 capsules (0.8 mg total) by mouth daily after supper. 180 capsule 3   Vitamin D, Ergocalciferol, (DRISDOL) 1.25 MG (50000 UNIT) CAPS capsule Take 1 capsule (50,000 Units total) by mouth every 7 (seven) days. 20 capsule 3   No current facility-administered medications for this visit.    Patient confirms/reports the following allergies:  Allergies  Allergen Reactions   Crestor [Rosuvastatin Calcium]     Myalgias   Elemental Sulfur    Lipitor [Atorvastatin]     Myalgia   Penicillin G Potassium [Penicillin G]     No orders of the defined types were placed in this encounter.   AUTHORIZATION INFORMATION Primary Insurance: 1D#: Group #:  Secondary Insurance: 1D#: Group #:  SCHEDULE INFORMATION: Date: 10/31/23 Time: Location: [facility]

## 2023-07-08 DIAGNOSIS — G629 Polyneuropathy, unspecified: Secondary | ICD-10-CM | POA: Diagnosis not present

## 2023-07-10 DIAGNOSIS — G629 Polyneuropathy, unspecified: Secondary | ICD-10-CM | POA: Diagnosis not present

## 2023-08-08 DIAGNOSIS — G629 Polyneuropathy, unspecified: Secondary | ICD-10-CM | POA: Diagnosis not present

## 2023-08-10 ENCOUNTER — Emergency Department: Payer: PPO

## 2023-08-10 ENCOUNTER — Emergency Department
Admission: EM | Admit: 2023-08-10 | Discharge: 2023-08-10 | Disposition: A | Discharge status: Home / Self Care | Payer: PPO | Attending: Student in an Organized Health Care Education/Training Program | Admitting: Student in an Organized Health Care Education/Training Program

## 2023-08-10 ENCOUNTER — Other Ambulatory Visit: Payer: Self-pay

## 2023-08-10 DIAGNOSIS — W01198A Fall on same level from slipping, tripping and stumbling with subsequent striking against other object, initial encounter: Secondary | ICD-10-CM | POA: Diagnosis not present

## 2023-08-10 DIAGNOSIS — W19XXXA Unspecified fall, initial encounter: Secondary | ICD-10-CM

## 2023-08-10 DIAGNOSIS — Y9279 Other farm location as the place of occurrence of the external cause: Secondary | ICD-10-CM | POA: Insufficient documentation

## 2023-08-10 DIAGNOSIS — S0990XA Unspecified injury of head, initial encounter: Secondary | ICD-10-CM | POA: Diagnosis not present

## 2023-08-10 DIAGNOSIS — G629 Polyneuropathy, unspecified: Secondary | ICD-10-CM | POA: Diagnosis not present

## 2023-08-10 DIAGNOSIS — S0101XA Laceration without foreign body of scalp, initial encounter: Secondary | ICD-10-CM | POA: Insufficient documentation

## 2023-08-10 DIAGNOSIS — Y9389 Activity, other specified: Secondary | ICD-10-CM | POA: Insufficient documentation

## 2023-08-10 DIAGNOSIS — Z23 Encounter for immunization: Secondary | ICD-10-CM | POA: Diagnosis not present

## 2023-08-10 DIAGNOSIS — R220 Localized swelling, mass and lump, head: Secondary | ICD-10-CM | POA: Diagnosis not present

## 2023-08-10 MED ORDER — LIDOCAINE-EPINEPHRINE (PF) 2 %-1:200000 IJ SOLN
10.0000 mL | Freq: Once | INTRAMUSCULAR | Status: AC
  Administered 2023-08-10: 10 mL via INTRADERMAL
  Filled 2023-08-10: qty 20

## 2023-08-10 MED ORDER — TETANUS-DIPHTH-ACELL PERTUSSIS 5-2.5-18.5 LF-MCG/0.5 IM SUSY
0.5000 mL | PREFILLED_SYRINGE | Freq: Once | INTRAMUSCULAR | Status: AC
  Administered 2023-08-10: 0.5 mL via INTRAMUSCULAR
  Filled 2023-08-10: qty 0.5

## 2023-08-10 NOTE — ED Notes (Signed)
See triage notes. 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 Patient has hematoma to the left side of his head. Denies pain or blurry vision. Unsure of LOC

## 2023-08-10 NOTE — ED Provider Notes (Signed)
[facility] Provider Note    Event Date/Time   First MD Initiated Contact with Patient 08/10/23 [phone]     (approximate)   History   Fall   HPI  Elzo Knupp. is a 67 y.o. 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  Was using his Gator put in park but then it started rolling away after he got off the Gator.  He moved to try to stop it is uncertain as to how he fell or what he hit his head on.  Denies any LOC.  No numbness or tingling.  Has pain to the left parietal scalp.  Not on any blood thinners.    Physical Exam   Triage Vital Signs: ED Triage Vitals [08/10/23 0935]  Encounter Vitals Group     BP (!) 144/85     Systolic BP Percentile      Diastolic BP Percentile      Pulse Rate 80     Resp 18     Temp 97.9 F (36.6 C)     Temp Source Oral     SpO2 98 %     Weight 220 lb (99.8 kg)     Height 6\' 1"  (1.854 m)     Head Circumference      Peak Flow      Pain Score      Pain Loc      Pain Education      Exclude from Growth Chart     Most recent vital signs: Vitals:   08/10/23 0935  BP: (!) 144/85  Pulse: 80  Resp: 18  Temp: 97.9 F (36.6 C)  SpO2: 98%     Constitutional: Alert  Eyes: Conjunctivae are normal.  Head: 1.5 cm laceration to left parietal scalp. Nose: No congestion/rhinnorhea. Mouth/Throat: Mucous membranes are moist.   Neck: Painless ROM.  Cardiovascular:   Good peripheral circulation. Respiratory: Normal respiratory effort.  No retractions.  Gastrointestinal: Soft and nontender.  Musculoskeletal:  no deformity Neurologic:  MAE spontaneously. No gross focal neurologic deficits are appreciated.  Skin:  Skin is warm, dry and intact. No rash noted. Psychiatric: Mood and affect are normal. Speech and behavior are normal.    ED Results / Procedures / Treatments   Labs (all labs ordered are listed, but only abnormal results are displayed) Labs Reviewed - No data  to display   EKG     RADIOLOGY Please see ED Course for my review and interpretation.  I personally reviewed all radiographic images ordered to evaluate for the above acute complaints and reviewed radiology reports and findings.  These findings were personally discussed with the patient.  Please see medical record for radiology report.    PROCEDURES:  Critical Care performed: No  ..Laceration Repair  Date/Time: 08/10/2023 11:02 AM  Performed by: Willy Eddy, MD Authorized by: Willy Eddy, MD   Anesthesia:    Anesthesia method:  Local infiltration   Local anesthetic:  Lidocaine 1% WITH epi Laceration details:    Location:  Scalp   Scalp location:  L parietal   Length (cm):  1.5 Treatment:    Area cleansed with:  Chlorhexidine   Amount of cleaning:  Standard Skin repair:    Repair method:  Staples   Number of staples:  5 Approximation:    Approximation:  Close Repair type:    Repair type:  Simple    MEDICATIONS ORDERED IN ED:  Medications  lidocaine-EPINEPHrine (XYLOCAINE W/EPI) 2 %-1:200000 (PF) injection 10 mL (10 mLs Intradermal Given by Other 08/10/23 1029)  Tdap (BOOSTRIX) injection 0.5 mL (0.5 mLs Intramuscular Given 08/10/23 1030)     IMPRESSION / MDM / ASSESSMENT AND PLAN / ED COURSE  I reviewed the triage vital signs and the nursing notes.                              Differential diagnosis includes, but is not limited to, laceration, contusion, SDH, IPH, SAH   Patient presenting to the ER for evaluation of symptoms as described above.  Based on symptoms, risk factors and considered above differential, this presenting complaint could reflect a potentially life-threatening illness therefore the patient will be placed on continuous pulse oximetry and telemetry for monitoring.  CT head will be ordered for the blood differential.  Not consistent with syncopal event.  Do not feel a bladder clinically indicated.     Clinical Course as of  08/10/23 1103  Wed Aug 10, 2023  1012 CT head on my review and interpretation without evidence of SDH or IPH. [PR]    Clinical Course User Index [PR] Willy Eddy, MD   Lack repaired as above.  Tetanus updated.  Patient stable and appropriate for outpatient follow-up.   FINAL CLINICAL IMPRESSION(S) / ED DIAGNOSES   Final diagnoses:  Fall, initial encounter  Laceration of scalp, initial encounter     Rx / DC Orders   ED Discharge Orders     None        Note:  This document was prepared using Dragon voice recognition software and may include unintentional dictation errors.    Willy Eddy, MD 08/10/23 [phone]

## 2023-08-10 NOTE — ED Triage Notes (Signed)
Pt to ED via POV from home. 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 Pt has hematoma to left side of head. Pt denies pain or blurry vision. Pt reports unsure if LOC.

## 2023-08-15 ENCOUNTER — Telehealth: Payer: Self-pay | Admitting: Internal Medicine

## 2023-08-15 NOTE — Telephone Encounter (Signed)
Please advise where to work him in.

## 2023-08-15 NOTE — Telephone Encounter (Signed)
Copied from CRM [id number]. Topic: Appointment Scheduling - Scheduling Inquiry for Clinic >> Aug 15, 2023 10:01 AM Phill Myron wrote: Attn: [name] is calling to get his scalp staples removed. Please advise

## 2023-09-05 ENCOUNTER — Encounter: Payer: PPO | Attending: Physical Medicine and Rehabilitation | Admitting: Physical Medicine and Rehabilitation

## 2023-09-05 VITALS — BP 118/75 | HR 89 | Ht 73.0 in | Wt 224.4 lb

## 2023-09-05 DIAGNOSIS — I10 Essential (primary) hypertension: Secondary | ICD-10-CM | POA: Insufficient documentation

## 2023-09-05 DIAGNOSIS — G629 Polyneuropathy, unspecified: Secondary | ICD-10-CM | POA: Insufficient documentation

## 2023-09-05 DIAGNOSIS — Z794 Long term (current) use of insulin: Secondary | ICD-10-CM

## 2023-09-05 DIAGNOSIS — E1142 Type 2 diabetes mellitus with diabetic polyneuropathy: Secondary | ICD-10-CM | POA: Insufficient documentation

## 2023-09-05 DIAGNOSIS — E559 Vitamin D deficiency, unspecified: Secondary | ICD-10-CM | POA: Insufficient documentation

## 2023-09-05 DIAGNOSIS — E663 Overweight: Secondary | ICD-10-CM | POA: Diagnosis not present

## 2023-09-05 MED ORDER — CAPSAICIN-CLEANSING GEL 8 % EX KIT
4.0000 | PACK | Freq: Once | CUTANEOUS | Status: AC
Start: 2023-09-05 — End: 2023-09-05
  Administered 2023-09-05: 4 via TOPICAL

## 2023-09-05 MED ORDER — PREGABALIN 75 MG PO CAPS: 75.0000 mg | ORAL_CAPSULE | Freq: Three times a day (TID) | ORAL | 3 refills | Status: DC

## 2023-09-05 NOTE — Patient Instructions (Signed)
HTN: -BP is ___today.  -Advised checking BP daily at home and logging results to bring into follow-up appointment with PCP and myself. -Reviewed BP meds today.  -Advised regarding healthy foods that can help lower blood pressure and provided with a list: 1) citrus foods- high in vitamins and minerals 2) salmon and other fatty fish - reduces inflammation and oxylipins 3) swiss chard (leafy green)- high level of nitrates 4) pumpkin seeds- one of the best natural sources of magnesium 5) Beans and lentils- high in fiber, magnesium, and potassium 6) Berries- high in flavonoids 7) Amaranth (whole grain, can be cooked similarly to rice and oats)- high in magnesium and fiber 8) Pistachios- even more effective at reducing BP than other nuts 9) Carrots- high in phenolic compounds that relax blood vessels and reduce inflammation 10) Celery- contain phthalides that relax tissues of arterial walls 11) Tomatoes- can also improve cholesterol and reduce risk of heart disease 12) Broccoli- good source of magnesium, calcium, and potassium 13) Greek yogurt: high in potassium and calcium 14) Herbs and spices: Celery seed, cilantro, saffron, lemongrass, black cumin, ginseng, cinnamon, cardamom, sweet basil, and ginger 15) Chia and flax seeds- also help to lower cholesterol and blood sugar 16) Beets- high levels of nitrates that relax blood vessels  17) spinach and bananas- high in potassium  -Provided lise of supplements that can help with hypertension:  1) magnesium: one high quality brand is Bioptemizers since it contains all 7 types of magnesium, otherwise over the counter magnesium gluconate 400mg  is a good option 2) B vitamins 3) vitamin D 4) potassium 5) CoQ10 6) L-arginine 7) Vitamin C 8) Beetroot -Educated that goal BP is 120/80. -Made goal to incorporate some of the above foods into diet.    Diabetes: -check CBGs daily, log, and bring log to follow-up appointment -avoid sugar, bread, pasta,  rice -avoid snacking -perform daily foot exam and at least annual eye exam -try to incorporate into your diet some of the following foods which are good for diabetes: 1) cinnamon- imitates effects of insulin, increasing glucose transport into cells ([place] or [place] cinnamon is best, least processed) 2) nuts- can slow down the blood sugar response of carbohydrate rich foods 3) oatmeal- contains and anti-inflammatory compound avenanthramide 4) whole-milk yogurt (best types are no sugar, [place] yogurt, or goat/sheep yogurt) 5) beans- high in protein, fiber, and vitamins, low glycemic index 6) broccoli- great source of vitamin A and C 7) quinoa- higher in protein and fiber than other grains 8) spinach- high in vitamin A, fiber, and protein 9) olive oil- reduces glucose levels, LDL, and triglycerides 10) salmon- excellent amount of omega-3-fatty acids 11) walnuts- rich in antioxidants 12) apples- high in fiber and quercetin 13) carrots- highly nutritious with low impact on blood sugar 14) eggs- improve HDL (good cholesterol), high in protein, keep you satiated 15) turmeric: improves blood sugars, cardiovascular disease, and protects kidney health 16) garlic: improves blood sugar, blood pressure, pain 17) tomatoes: highly nutritious with low impact on blood sugar

## 2023-09-05 NOTE — Progress Notes (Signed)
Subjective:    Patient ID: Casey Greenspan., male    DOB: [birth year], 67 y.o.   MRN: [id number]  HPI   Casey Hunter returns for f/u LONG COVID, Vitamin D deficiency, and HLD, and type 2 diabetes  1) Long COVID symptoms -feeling a lot better -eating oysters, salmon, liver once per week- continues with this -still has some fatigue -left leg still falls asleep- ready to repeat Qutenza today -some nights it can be pretty bad -it is not usually painful -he is trying to work on his diet. -he is taking the Lyrica two at night before sleeping -he has taken some ivermectin-was taking it every day when he felt bad.  -he asks about taking Pepcid and Claritin -does feel much better -feels like he is getting enough protein -he does not eat much citrus fruits due to his diabetes.  -he loves to eat strawberries and blackberries -checked testosterone level and discussed that his levels  -discussed vitamin D level and high dose supplementation -lot of stress since March 1st since his dad felt and broke his hip  2) Diabetic peripheral neuropathy: -pain has been stable -has been trying to give up bread and potatoes and all his sweets -both feet feel cold, continues to take nifedipine.  -Qutenza helps, would like to try again today -hemoglobin A1c 7.8 on 9/15, increased to 9.9 in January, and 10.1 on recent check -he has been very stressed by his parents' health -ready to repeat Qutenza today -CBGs better controlled- less than 150s -eating 1 egg at breakfast and drinks unsweet tea -weight is 248 lbs today -discussed his diet thoroughly.  -got 60% improvement with Qutenza first time, even better improvement this last time but he can't quantify it.  -restarted Trulicity -Ozempic was denied for him.  -pain is mostly present on soles of both feet- sometimes in his toes -tolerated last session well without any burning -discussed his hemoglobin A1c  3) BPH: -the tamsulosin has been helping  him. -only urinating twice per night  4) Fatigue -Contnues to have severe fatigue at times.  -yesterday was a bad tay -panning to return in August.   5) HTN: -BP is 118/75 -has been well controlled -now off all blood pressure medications except for flomax 0.4mg .  -he has been checking BP at home and it has been stable.  -he likes cinnamon  6) Raynaud's syndrome: -the nifedipine helped his upper extremity neuropathy but his lower extremities are still very cold at night -he continues to take this  7) History of multiple acute COVID infections -he asks why he is getting these infections every year -he is feeling better than when he called me on Saturday -he asks for how long he should quarantine.   8) Vitamin D deficiency -discussed that level has improved to 40s, and has remained stable there since we checked 4 months ago -discussed that this was an improvement from the first time we checked -prescribed refill for 20 weeks of weekly high dose supplement -would like to repeat today  9) elevated triglycerides -recommended avoiding processed fodos -repatha was not covered and he would like to see   10) Accident on Friday -large piece of wood hit him in his face.  -broke his nose -had 6 stitches -really scared him  He continues to have numbness in his left calf. It is better than initially. His left hand will also sleep. It hurts but he is not sure if this is pain.  He has been having  increase readings on his Freestyle Libre device- around 200s. For breakfast he eats eggs and sausage. For lunch he eats barebcue and sometimes french fries. Once in a while he eats sugar. He drinks water and unsweet tea and a fruit drink. He eats two bananas every day.   He has been checking pressures every day and they have been well controlled. He takes his BP medication 5 days per week.  Prior history: Dizziness is better, but still present at times. He has brought with him an excellent log  of his AM and PM blood pressures, which are excellent off any blood pressure medications.   Obesity: He has made excellent changes to his diet. He has lost 2 lbs since last visit (currently 266 lbs). He has been eating one banana per day, eggs daily, salmon once per week, meat, and a lot of nuts. He feels he still needs to minimize sweets.   Energy: Has greatly improved but is still not at the level required for his work. He has weaned off the Ritalin. Discussed goal of restarting work on October 11th for three days per week and he is agreeable.   Reviewed PCP note and he has been doing much better. HbA1c has improved to 7.7  Left leg numbness continues but is slightly improved.     Pain Inventory Average Pain 4 Pain Right Now 7 My pain is intermittent, constant, and tingling  In the last 24 hours, has pain interfered with the following? General activity 4 Relation with others 2 Enjoyment of life 5 What TIME of day is your pain at its worst? varies Sleep (in general) Fair  Pain is worse with: walking, bending, and some activites Pain improves with: medication Relief from Meds: 5  Family History  Problem Relation Age of Onset   Hypertension Mother    Hyperlipidemia Mother    Hyperlipidemia Father    Social History   Socioeconomic History   Marital status: Divorced    Spouse name: Not on file   Number of children: 2   Years of education: 12   Highest education level: High school graduate  Occupational History   Not on file  Tobacco Use   Smoking status: Never   Smokeless tobacco: Never  Vaping Use   Vaping status: Never Used  Substance and Sexual Activity   Alcohol use: No    Alcohol/week: 0.0 standard drinks of alcohol   Drug use: No   Sexual activity: Not Currently  Other Topics Concern   Not on file  Social History Narrative   Lives alone   Social Determinants of Health   Financial Resource Strain: Low Risk  (05/06/2023)   Overall Financial Resource  Strain (CARDIA)    Difficulty of Paying Living Expenses: Not hard at all  Food Insecurity: Unknown (05/06/2023)   Hunger Vital Sign    Worried About Running Out of Food in the Last Year: Never true    Ran Out of Food in the Last Year: Not on file  Transportation Needs: No Transportation Needs (05/06/2023)   PRAPARE - Administrator, Civil Service (Medical): No    Lack of Transportation (Non-Medical): No  Physical Activity: Sufficiently Active (05/06/2023)   Exercise Vital Sign    Days of Exercise per Week: 5 days    Minutes of Exercise per Session: 60 min  Stress: Stress Concern Present (05/06/2023)   [employer] of Occupational Health - Occupational Stress Questionnaire    Feeling of Stress : Rather much  Social Connections: Moderately Isolated (05/06/2023)   Social Connection and Isolation Panel [NHANES]    Frequency of Communication with Friends and Family: Twice a week    Frequency of Social Gatherings with Friends and Family: Three times a week    Attends Religious Services: Never    Active Member of Clubs or Organizations: Not on file    Attends Banker Meetings: More than 4 times per year    Marital Status: Divorced   Past Surgical History:  Procedure Laterality Date   HERNIA REPAIR     Past Surgical History:  Procedure Laterality Date   HERNIA REPAIR     Past Medical History:  Diagnosis Date   Diabetic peripheral neuropathy associated with type 2 diabetes mellitus 03/14/2017   DOE (dyspnea on exertion) 11/2019   Onset jan 2021 with covid 19  - assoc with atypical cp developed during the infection present mostly sitting/ absent supine/ better with deep breathing/ no worse with ex - 01/30/2020   Walked RA x two laps =  approx 596ft @ brisk pace - stopped due to end of study, no sob/ no change cp  with sats of 93 % at the end of the study and no acute ekg changes  - Echo  02/13/2020  wnl with  no pericadial ef   Fatigue 11/2019   History of COVID-19  11/2019   Hyperlipidemia associated with type 2 diabetes mellitus 09/16/2015   Hypertension associated with type 2 diabetes mellitus 09/16/2015   Knee osteoarthritis 04/14/2016   Mild neurocognitive disorder 06/12/2020   Numbness of lower extremity 11/2019   Attributed to COVID-19   Sleep apnea    Patient denied being diagnosed with OSA in the past   BP 118/75   Pulse 89   Ht 6\' 1"  (1.854 m)   Wt 224 lb 6.4 oz (101.8 kg)   SpO2 94%   BMI 29.61 kg/m   Opioid Risk Score:   Fall Risk Score:  `1  Depression screen [facility] 2/9     06/14/2023    1:04 PM 06/02/2023    9:43 AM 05/06/2023    8:27 AM 03/14/2023    1:15 PM 03/03/2023    9:22 AM 12/23/2022    8:51 AM 02/11/2022    8:53 AM  Depression screen PHQ 2/9  Decreased Interest 0 0 0 0 0 0 0  Down, Depressed, Hopeless 0 0 0 0 0 0 0  PHQ - 2 Score 0 0 0 0 0 0 0  Altered sleeping 0  0 0  0   Tired, decreased energy 0   0  0   Change in appetite 0   0  0   Feeling bad or failure about yourself  0   0  0   Trouble concentrating 0   0  0   Moving slowly or fidgety/restless 0   0  0   Suicidal thoughts 0   0  0   PHQ-9 Score 0  0 0  0   Difficult doing work/chores Not difficult at all   Not difficult at all  Not difficult at all    Review of Systems  Constitutional: Negative.   HENT: Negative.    Eyes: Negative.   Respiratory: Negative.    Cardiovascular: Negative.   Gastrointestinal: Negative.   Endocrine: Negative.   Genitourinary: Negative.   Musculoskeletal:  Positive for gait problem.       Left shoulder pain , right and left knee pain ,  left leg pain  Skin: Negative.   Allergic/Immunologic: Negative.   Hematological: Negative.   Psychiatric/Behavioral: Negative.         Objective:   Physical Exam Gen: no distress, normal appearing, BMI 29.61, weight 224 lbs, BP 118/75 Gen: no distress, normal appearing HEENT: oral mucosa pink and moist, NCAT Cardio: Reg rate Chest: normal effort, normal rate of breathing Abd: soft,  non-distended Ext: no edema Psych: pleasant, normal affect Skin: intact, no open lesions Neuro: Alert and oriented x3     Assessment & Plan:  Casey Hunter is a 67 year old man who presents for follow-up on LONG COVID symptoms.   1) HTN: Improved control. Discontinue Losartan -continue flomax -continue eating a banana every day -continue grapefruit at least once per week.  -Advised checking BP daily at home and logging results to bring into follow-up appointment with her PCP and myself. -Reviewed BP meds today.  -Advised checking BP daily at home and logging results to bring into follow-up appointment with PCP and myself. -Reviewed BP meds today.  -Advised regarding healthy foods that can help lower blood pressure and provided with a list: 1) citrus foods- high in vitamins and minerals 2) salmon and other fatty fish - reduces inflammation and oxylipins 3) swiss chard (leafy green)- high level of nitrates 4) pumpkin seeds- one of the best natural sources of magnesium 5) Beans and lentils- high in fiber, magnesium, and potassium 6) Berries- high in flavonoids 7) Amaranth (whole grain, can be cooked similarly to rice and oats)- high in magnesium and fiber 8) Pistachios- even more effective at reducing BP than other nuts 9) Carrots- high in phenolic compounds that relax blood vessels and reduce inflammation 10) Celery- contain phthalides that relax tissues of arterial walls 11) Tomatoes- can also improve cholesterol and reduce risk of heart disease 12) Broccoli- good source of magnesium, calcium, and potassium 13) Greek yogurt: high in potassium and calcium 14) Herbs and spices: Celery seed, cilantro, saffron, lemongrass, black cumin, ginseng, cinnamon, cardamom, sweet basil, and ginger 15) Chia and flax seeds- also help to lower cholesterol and blood sugar 16) Beets- high levels of nitrates that relax blood vessels  17) spinach and bananas- high in potassium  -Provided lise of  supplements that can help with hypertension:  1) magnesium: one high quality brand is Bioptemizers since it contains all 7 types of magnesium, otherwise over the counter magnesium gluconate 400mg  is a good option 2) B vitamins 3) vitamin D 4) potassium 5) CoQ10 6) L-arginine 7) Vitamin C 8) Beetroot -Educated that goal BP is 120/80. -Made goal to incorporate some of the above foods into diet.     2) Fatigue: Improved but still present. Continue NAC 600mg  BID and gave list of foods that increase NAC. Off Ritalin. Increase work to 4 days per week until re-eval on April 1st. Provided with note. This is one of the reason he feels he needs to retire.   3) Vitamin D deficiency -continue ergocalciferol 50,000U once per week for 7 weeks  4) Leg numbness: Persists, but improved. Explained etiologies of nerve injury 2/2 COVID-19 vs. Diabetic neuropathy.   5) History of Obesity, now overweight: Weight 224, lost 48 lbs in the past year- He eats salmon once per week, 1 egg daily, nuts, meat, olive oil. Made goal to avoid sugar.  -Discussed the benefits of intermittent fasting. Recommended starting with pushing dinner 15 minutes earlier and when this feels easy, continuing to push dinner 15 minutes earlier. Discussed that this  can help her body to improve its ability to burn fat rather than glucose, improving insulin sensitivity. Recommended drinking Roobois tea in the evening to help curb appetite and for its numerous health benefits. -continue Ozempic prescribed by his PCP   -Educated regarding health benefits of weight loss- for pain, general health, chronic disease prevention, immune health, mental health.  -Will monitor weight every visit.  -Consider Roobois tea daily.  -Discussed the benefits of intermittent fasting. -Discussed foods that can assist in weight loss: 1) leafy greens- high in fiber and nutrients 2) dark chocolate- improves metabolism (if prefer sweetened, best to sweeten with honey  instead of sugar).  3) cruciferous vegetables- high in fiber and protein 4) full fat yogurt: high in healthy fat, protein, calcium, and probiotics 5) apples- high in a variety of phytochemicals 6) nuts- high in fiber and protein that increase feelings of fullness 7) grapefruit: rich in nutrients, antioxidants, and fiber (not to be taken with anticoagulation) 8) beans- high in protein and fiber 9) salmon- has high quality protein and healthy fats 10) green tea- rich in polyphenols 11) eggs- rich in choline and vitamin D 12) tuna- high protein, boosts metabolism 13) avocado- decreases visceral abdominal fat 14) chicken (pasture raised): high in protein and iron 15) blueberries- reduce abdominal fat and cholesterol 16) whole grains- decreases calories retained during digestion, speeds metabolism 17) chia seeds- curb appetite 18) chilies- increases fat metabolism   6) DM 2:  Discussed HgbA1c worsening to 10.1, repeat HgbA1c today -discussed dietary changes -discussed that stress, lack of sleep, and recently worsening diet could have contributed -encouraging trying to find an aide to help with his mother's care so he can sleep at night -restarted metformin 500mg  daily.  -check CBGs daily, log, and bring log to follow-up appointment -avoid sugar, bread, pasta, rice -avoid snacking -Recommended 1 glass water with 1 TB apple cider vinegar before meals to reduce CBG spike, has additional health benefits, drink with straw to protect enamel.   -discussed that his PCP changed him from Trulicity to [employer] -perform daily foot exam and at least annual eye exam -check CBGs daily, log, and bring log to follow-up appointment -avoid sugar, bread, pasta, rice -avoid snacking -perform daily foot exam and at least annual eye exam -try to incorporate into your diet some of the following foods which are good for diabetes: 1) cinnamon- imitates effects of insulin, increasing glucose transport into cells  ([place] or [place] cinnamon is best, least processed) 2) nuts- can slow down the blood sugar response of carbohydrate rich foods 3) oatmeal- contains and anti-inflammatory compound avenanthramide 4) whole-milk yogurt (best types are no sugar, [place] yogurt, or goat/sheep yogurt) 5) beans- high in protein, fiber, and vitamins, low glycemic index 6) broccoli- great source of vitamin A and C 7) quinoa- higher in protein and fiber than other grains 8) spinach- high in vitamin A, fiber, and protein 9) olive oil- reduces glucose levels, LDL, and triglycerides 10) salmon- excellent amount of omega-3-fatty acids 11) walnuts- rich in antioxidants 12) apples- high in fiber and quercetin 13) carrots- highly nutritious with low impact on blood sugar 14) eggs- improve HDL (good cholesterol), high in protein, keep you satiated 15) turmeric: improves blood sugars, cardiovascular disease, and protects kidney health 16) garlic: improves blood sugar, blood pressure, pain 17) tomatoes: highly nutritious with low impact on blood sugar  7) HLD -really improved on last labs! -continue Rapatha -discussed results of 10/5 lipid panel which is within normal limits except for elevated  triglycerides.   8) Diabetic peripheral neuropathy Refilled Lyrica -discussed that he has been trying to cut out sweets, bread, and potatoes -discussed positive response to Qutenza -Discussed Qutenza as an option for neuropathic pain control. Discussed that this is a capsaicin patch, stronger than capsaicin cream. Discussed that it is currently approved for diabetic peripheral neuropathy and post-herpetic neuralgia, but that it has also shown benefit in treating other forms of neuropathy. Provided patient with link to site to learn more about the patch: https://www.clark.biz/. Discussed that the patch would be placed in office and benefits usually last 3 months. Discussed that unintended exposure to capsaicin can cause severe  irritation of eyes, mucous membranes, respiratory tract, and skin, but that Qutenza is a local treatment and does not have the systemic side effects of other nerve medications. Discussed that there may be pain, itching, erythema, and decreased sensory function associated with the application of Qutenza. Side effects usually subside within 1 week. A cold pack of analgesic medications can help with these side effects. Blood pressure can also be increased due to pain associated with administration of the patch.   Prescribed Zynex Nexwave  4 patches of Qutenza was applied to the area of pain. Ice packs were applied during the procedure to ensure patient comfort. Blood pressure was monitored every 15 minutes. The patient tolerated the procedure well. Post-procedure instructions were given and follow-up has been scheduled.    9) BPH: -continue flomax to 0.8mg  HS    10) Decreased muscle mass, brain fog, decreased strength: -improving  11) Elevated PSA -normalized on 11/22 and 10/6 labs  12) testosterone deficiency: -normalized with below interventions to 420 on 10/6 -will check testosterone next visit.  -encouraged vitamin D supplementation, resistance training, high protein/low carb diet.  -discussed that this has normalized.  -check testosterone today -continue liver once in a while for nutritional benefits.   13) History of multiple acute COVID infections -prescribed pepcid on Saturday and patient is now feeling better -discussed at least 5 day quarantine and until symptoms resolve -discussed his ivermectin use -discussed foods rich in vitamin C and zinc to help boost immune system to reduce severe infections in the future -discussed risks and benefits of COVID vaccines  14) Cataracts -discussed plan for corrective lessons.  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

## 2023-09-05 NOTE — Addendum Note (Signed)
Addended by: Silas Sacramento T on: 09/05/2023 01:34 PM   Modules accepted: Orders

## 2023-09-07 DIAGNOSIS — G629 Polyneuropathy, unspecified: Secondary | ICD-10-CM | POA: Diagnosis not present

## 2023-09-12 ENCOUNTER — Telehealth: Payer: Self-pay

## 2023-09-12 NOTE — Telephone Encounter (Signed)
Transition Care Management Follow-up Telephone Call Date of discharge and from where: 08/10/2023 [facility] How have you been since you were released from the hospital? Patient stated he is feeling fine no further issues. Any questions or concerns? No  Items Reviewed: Did the pt receive and understand the discharge instructions provided? Yes  Medications obtained and verified?  No medication prescribed. Other? No  Any new allergies since your discharge? No  Dietary orders reviewed? Yes Do you have support at home? Yes   Follow up appointments reviewed:  PCP Hospital f/u appt confirmed? Yes  Scheduled to see Margarita Mail, DO on 09/15/2023 @ [facility]. Specialist Hospital f/u appt confirmed? No  Scheduled to see  on  @ . Are transportation arrangements needed? No  If their condition worsens, is the pt aware to call PCP or go to the Emergency Dept.? Yes Was the patient provided with contact information for the PCP's office or ED? Yes Was to pt encouraged to call back with questions or concerns? Yes   Gerik Coberly [facility]  [facility], [facility] Guide Direct Dial: [phone]  Website: [email or web address]

## 2023-09-13 NOTE — Progress Notes (Unsigned)
Established Patient Office Visit  Subjective    Patient ID: Casey Brantly., male    DOB: 1956-11-01  Age: 67 y.o. MRN: [id number]  CC:  No chief complaint on file.   HPI Casey Hunter. presents to follow up on chronic medical conditions. Following with Dr. Carlis Abbott PMR in [place].   Patient has unfortunately lost both of his parents this year, his father passed away in 02/10/23 and his mother recently passed away in the spring.  Patient has been feeling much more apathetic decreased motivation and fatigue for the last few weeks.  Patient also has been having chronic nausea and vomiting.  He states this will happen a few times a week for the last 3 years.  He has never told anybody about this before.  He states that could happen after eating or on an empty stomach he will feel nauseous and quickly have to vomit.  No abdominal pain.  Nausea resides after vomiting.  History of HTN: had been on 5 different medications but since he had COVID his blood pressure has been much better controlled.   HLD: -Medications: Repatha 140 mg every 14 days, aspirin - statin intolerance. 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  -Patient is compliant with above medications and reports no side effects.  -Last lipid panel: Lipid Panel -     Component Value Date/Time   CHOL 264 (H) 12/02/2022 1149   CHOL 150 04/24/2018 0820   CHOL 166 11/22/2012 0903   TRIG 182 (H) 12/02/2022 1149   TRIG 174 (H) 04/24/2018 0820   TRIG 97 11/22/2012 0903   HDL 48 12/02/2022 1149   HDL 40 11/22/2012 0903   CHOLHDL 5.5 (H) 12/02/2022 1149   CHOLHDL 4 04/24/2021 0945   VLDL 29.2 04/24/2021 0945   VLDL 35 (H) 04/24/2018 0820   VLDL 19 11/22/2012 0903   LDLCALC 182 (H) 12/02/2022 1149   LDLCALC 107 (H) 11/22/2012 0903   LABVLDL 34 12/02/2022 1149    Diabetes, Type 2 with Neuropathy: -Last A1c 7/24  7.9% -Medications: Ozempic 0.5 mg which he is tolerating well, Farxiga 10 mg, Metformin 1000 mg twice daily. Also on Lyrica 75 mg TID, Nifedipine 30 mg for numbness and tingling. PMR does Capsaicin patches for him as well.  -Patient is compliant with the above medications and reports no side effects.  -Eye exam: UTD 12/23 -Foot exam: UTD 4/24 -Microalbumin: UTD 2/24 -Statin: Statin intolerance - on Repatha -PNA vaccine: UTD  -Denies symptoms of hypoglycemia, polyuria, polydipsia, numbness extremities, foot ulcers/trauma.   BPH: -Currently on Flomax 0.8 mg, symptoms fairly well controlled   Vitamin D Deficiency: -Currently on 50,000 IU weekly - has been on this dose about 1 year -Last Vitamin D level 43 on 12/02/22  Health Maintenance: -Blood work UTD -Colonoscopy 2011, due    Outpatient Encounter Medications as of 09/15/2023  Medication Sig   acetaminophen (TYLENOL) 650 MG CR tablet Take 1,300 mg by mouth as needed.   aspirin 81 MG tablet Take 81 mg by mouth daily.   Continuous Blood Gluc Receiver (FREESTYLE LIBRE 2 READER) DEVI Apply 1 Units topically daily. Use as directed.   Continuous Blood Gluc Sensor (FREESTYLE LIBRE 2 SENSOR) MISC Apply 1 Units topically daily. Use as directed.   dapagliflozin propanediol (FARXIGA) 10 MG TABS tablet Take 1 tablet (10 mg total) by mouth daily.  Evolocumab (REPATHA SURECLICK) 140 MG/ML SOAJ Inject 140 mg into the skin every 14 (fourteen) days. Has failed statins, Zetia, and Niacin   meloxicam (MOBIC) 15 MG tablet TAKE 1 TABLET BY MOUTH DAILY AS  NEEDED FOR PAIN   metFORMIN (GLUCOPHAGE) 1000 MG tablet Take 1 tablet (1,000 mg total) by mouth 2 (two) times daily with a meal.   NIFEdipine (PROCARDIA-XL/NIFEDICAL-XL) 30 MG 24 hr tablet Take 1 tablet (30 mg total) by mouth daily.   pantoprazole (PROTONIX) 40 MG tablet Take 1 tablet (40 mg total) by mouth daily.   pregabalin (LYRICA) 75 MG capsule Take 1 capsule (75 mg total) by mouth 3 (three) times  daily.   tamsulosin (FLOMAX) 0.4 MG CAPS capsule Take 2 capsules (0.8 mg total) by mouth daily after supper.   Vitamin D, Ergocalciferol, (DRISDOL) 1.25 MG (50000 UNIT) CAPS capsule Take 1 capsule (50,000 Units total) by mouth every 7 (seven) days.   No facility-administered encounter medications on file as of 09/15/2023.    Past Medical History:  Diagnosis Date   Diabetic peripheral neuropathy associated with type 2 diabetes mellitus 03/14/2017   DOE (dyspnea on exertion) 11/2019   Onset jan 2021 with covid 19  - assoc with atypical cp developed during the infection present mostly sitting/ absent supine/ better with deep breathing/ no worse with ex - 01/30/2020   Walked RA x two laps =  approx 532ft @ brisk pace - stopped due to end of study, no sob/ no change cp  with sats of 93 % at the end of the study and no acute ekg changes  - Echo  02/13/2020  wnl with  no pericadial ef   Fatigue 11/2019   History of COVID-19 11/2019   Hyperlipidemia associated with type 2 diabetes mellitus 09/16/2015   Hypertension associated with type 2 diabetes mellitus 09/16/2015   Knee osteoarthritis 04/14/2016   Mild neurocognitive disorder 06/12/2020   Numbness of lower extremity 11/2019   Attributed to COVID-19   Sleep apnea    Patient denied being diagnosed with OSA in the past    Past Surgical History:  Procedure Laterality Date   HERNIA REPAIR      Family History  Problem Relation Age of Onset   Hypertension Mother    Hyperlipidemia Mother    Hyperlipidemia Father     Social History   Socioeconomic History   Marital status: Divorced    Spouse name: Not on file   Number of children: 2   Years of education: 12   Highest education level: High school graduate  Occupational History   Not on file  Tobacco Use   Smoking status: Never   Smokeless tobacco: Never  Vaping Use   Vaping status: Never Used  Substance and Sexual Activity   Alcohol use: No    Alcohol/week: 0.0 standard drinks of  alcohol   Drug use: No   Sexual activity: Not Currently  Other Topics Concern   Not on file  Social History Narrative   Lives alone   Social Determinants of Health   Financial Resource Strain: Low Risk  (05/06/2023)   Overall Financial Resource Strain (CARDIA)    Difficulty of Paying Living Expenses: Not hard at all  Food Insecurity: Unknown (05/06/2023)   Hunger Vital Sign    Worried About Running Out of Food in the Last Year: Never true    Ran Out of Food in the Last Year: Not on file  Transportation Needs: No Transportation Needs (05/06/2023)   PRAPARE -  Administrator, Civil Service (Medical): No    Lack of Transportation (Non-Medical): No  Physical Activity: Sufficiently Active (05/06/2023)   Exercise Vital Sign    Days of Exercise per Week: 5 days    Minutes of Exercise per Session: 60 min  Stress: Stress Concern Present (05/06/2023)   [employer] of Occupational Health - Occupational Stress Questionnaire    Feeling of Stress : Rather much  Social Connections: Moderately Isolated (05/06/2023)   Social Connection and Isolation Panel [NHANES]    Frequency of Communication with Friends and Family: Twice a week    Frequency of Social Gatherings with Friends and Family: Three times a week    Attends Religious Services: Never    Active Member of Clubs or Organizations: Not on file    Attends Club or Organization Meetings: More than 4 times per year    Marital Status: Divorced  Intimate Partner Violence: Not At Risk (05/06/2023)   Humiliation, Afraid, Rape, and Kick questionnaire    Fear of Current or Ex-Partner: No    Emotionally Abused: No    Physically Abused: No    Sexually Abused: No    Review of Systems  Constitutional:  Negative for chills and fever.  Eyes:  Negative for blurred vision.  Respiratory:  Negative for shortness of breath.   Cardiovascular:  Negative for chest pain.  Gastrointestinal:  Positive for nausea and vomiting. Negative for  abdominal pain.        Objective    There were no vitals taken for this visit.  Physical Exam Constitutional:      Appearance: Normal appearance.  HENT:     Head: Normocephalic and atraumatic.  Eyes:     Conjunctiva/sclera: Conjunctivae normal.  Cardiovascular:     Rate and Rhythm: Normal rate and regular rhythm.  Pulmonary:     Effort: Pulmonary effort is normal.     Breath sounds: Normal breath sounds.  Skin:    General: Skin is warm and dry.  Neurological:     General: No focal deficit present.     Mental Status: He is alert. Mental status is at baseline.  Psychiatric:        Mood and Affect: Mood normal.        Behavior: Behavior normal.         Assessment & Plan:   1. Type 2 diabetes mellitus with diabetic polyneuropathy, without long-term current use of insulin (HCC): Trulicity unavailable, patient is now on Ozempic 0.5 mg.  He did have a low blood sugar a few times in the morning so we will continue current dose.  Continue Farxiga 10 mg, metformin 1000 mg twice daily.   - Semaglutide,0.25 or 0.5MG /DOS, (OZEMPIC, 0.25 OR 0.5 MG/DOSE,) 2 MG/3ML SOPN; Inject 0.5 mg into the skin once a week.  Dispense: 9 mL; Refill: 0  2. Hyperlipidemia associated with type 2 diabetes mellitus/Statin myopathy: Currently on Repatha, history of statin intolerance.  3. Nausea and vomiting, unspecified vomiting type: Will treat with acid suppressant, start Protonix.   - pantoprazole (PROTONIX) 40 MG tablet; Take 1 tablet (40 mg total) by mouth daily.  Dispense: 90 tablet; Refill: 0  4. Colon cancer screening: Referral for colonoscopy, may need EGD as well.   - Ambulatory referral to Gastroenterology   No follow-ups on file.   Margarita Mail, DO

## 2023-09-15 ENCOUNTER — Ambulatory Visit: Payer: PPO | Admitting: Internal Medicine

## 2023-09-15 ENCOUNTER — Telehealth: Payer: Self-pay | Admitting: Internal Medicine

## 2023-09-15 ENCOUNTER — Other Ambulatory Visit: Payer: Self-pay

## 2023-09-15 ENCOUNTER — Encounter: Payer: Self-pay | Admitting: Internal Medicine

## 2023-09-15 VITALS — BP 136/74 | HR 82 | Temp 98.0°F | Resp 18 | Ht 73.0 in | Wt 227.0 lb

## 2023-09-15 DIAGNOSIS — I10 Essential (primary) hypertension: Secondary | ICD-10-CM

## 2023-09-15 DIAGNOSIS — K219 Gastro-esophageal reflux disease without esophagitis: Secondary | ICD-10-CM | POA: Diagnosis not present

## 2023-09-15 DIAGNOSIS — Z7985 Long-term (current) use of injectable non-insulin antidiabetic drugs: Secondary | ICD-10-CM

## 2023-09-15 DIAGNOSIS — E1169 Type 2 diabetes mellitus with other specified complication: Secondary | ICD-10-CM

## 2023-09-15 DIAGNOSIS — E1142 Type 2 diabetes mellitus with diabetic polyneuropathy: Secondary | ICD-10-CM

## 2023-09-15 DIAGNOSIS — E785 Hyperlipidemia, unspecified: Secondary | ICD-10-CM

## 2023-09-15 DIAGNOSIS — Z7984 Long term (current) use of oral hypoglycemic drugs: Secondary | ICD-10-CM

## 2023-09-15 DIAGNOSIS — D229 Melanocytic nevi, unspecified: Secondary | ICD-10-CM | POA: Diagnosis not present

## 2023-09-15 LAB — POCT GLYCOSYLATED HEMOGLOBIN (HGB A1C): Hemoglobin A1C: 7.6 % — AB (ref 4.0–5.6)

## 2023-09-15 MED ORDER — SEMAGLUTIDE (1 MG/DOSE) 4 MG/3ML ~~LOC~~ SOPN
1.0000 mg | PEN_INJECTOR | SUBCUTANEOUS | 2 refills | Status: DC
Start: 2023-09-15 — End: 2023-09-15
  Filled 2023-09-15: qty 3, 28d supply, fill #0

## 2023-09-15 MED ORDER — NIFEDIPINE ER OSMOTIC RELEASE 30 MG PO TB24
30.0000 mg | ORAL_TABLET | Freq: Every day | ORAL | 3 refills | Status: DC
  Filled 2023-09-15: qty 90, 90d supply, fill #0

## 2023-09-15 MED ORDER — REPATHA SURECLICK 140 MG/ML ~~LOC~~ SOAJ
140.0000 mg | SUBCUTANEOUS | 3 refills | Status: DC
  Filled 2023-09-15 – 2023-12-07 (×2): qty 6, 84d supply, fill #0

## 2023-09-15 MED ORDER — SEMAGLUTIDE (1 MG/DOSE) 4 MG/3ML ~~LOC~~ SOPN
1.0000 mg | PEN_INJECTOR | SUBCUTANEOUS | 0 refills | Status: DC
Start: 2023-09-15 — End: 2023-12-20
  Filled 2023-09-15 – 2023-10-12 (×2): qty 9, 84d supply, fill #0

## 2023-09-15 MED ORDER — DAPAGLIFLOZIN PROPANEDIOL 10 MG PO TABS
10.0000 mg | ORAL_TABLET | Freq: Every day | ORAL | 1 refills | Status: DC
Start: 2023-09-15 — End: 2023-12-08
  Filled 2023-09-15 – 2023-12-07 (×2): qty 90, 90d supply, fill #0

## 2023-09-15 MED ORDER — PANTOPRAZOLE SODIUM 20 MG PO TBEC
20.0000 mg | DELAYED_RELEASE_TABLET | Freq: Every day | ORAL | 0 refills | Status: DC
Start: 2023-09-15 — End: 2023-12-07
  Filled 2023-09-15: qty 30, 30d supply, fill #0

## 2023-09-15 NOTE — Telephone Encounter (Signed)
Pt stated he needs the Rx for Semaglutide, 1 MG/DOSE, 4 MG/3ML SOPN to be sent in for a 90 day supply. Pt requests call back to advise when the 90 day Rx has been sent to his pharmacy. Cb# [phone]

## 2023-10-07 ENCOUNTER — Other Ambulatory Visit: Payer: Self-pay | Admitting: Physical Medicine and Rehabilitation

## 2023-10-07 DIAGNOSIS — I10 Essential (primary) hypertension: Secondary | ICD-10-CM

## 2023-10-07 MED ORDER — NIFEDIPINE ER OSMOTIC RELEASE 30 MG PO TB24
30.0000 mg | ORAL_TABLET | Freq: Every day | ORAL | 3 refills | Status: DC
Start: 2023-10-07 — End: 2023-12-08

## 2023-10-07 MED ORDER — TAMSULOSIN HCL 0.4 MG PO CAPS: 0.8000 mg | ORAL_CAPSULE | Freq: Every day | ORAL | 3 refills | Status: DC

## 2023-10-08 DIAGNOSIS — G629 Polyneuropathy, unspecified: Secondary | ICD-10-CM | POA: Diagnosis not present

## 2023-10-11 ENCOUNTER — Other Ambulatory Visit: Payer: Self-pay | Admitting: Internal Medicine

## 2023-10-11 ENCOUNTER — Other Ambulatory Visit: Payer: Self-pay

## 2023-10-11 DIAGNOSIS — E1142 Type 2 diabetes mellitus with diabetic polyneuropathy: Secondary | ICD-10-CM

## 2023-10-11 NOTE — Telephone Encounter (Signed)
Medication Refill -  Most Recent Primary Care Visit:  Provider: Margarita Mail  Department: [facility]  Visit Type: OFFICE VISIT  Date: 09/15/2023   Medication: Semaglutide, 1 MG/DOSE, 4 MG/3ML SOPN [[id number]]   ( last dose taken sunday, Mr. Blaes said he would like a 90 day supply)   Has the patient contacted their pharmacy? Yes  Is this the correct pharmacy for this prescription? Yes If no, delete pharmacy and type the correct one.  This is the patient's preferred pharmacy:   [facility] [address] [place] [address] Phone: [phone] Fax: [phone]   Has the prescription been filled recently? Yes  Is the patient out of the medication? Yes  Has the patient been seen for an appointment in the last year OR does the patient have an upcoming appointment? Yes  Can we respond through MyChart? No (refused)   Agent: Please be advised that Rx refills may take up to 3 business days. We ask that you follow-up with your pharmacy.

## 2023-10-12 ENCOUNTER — Other Ambulatory Visit: Payer: Self-pay

## 2023-10-12 MED ORDER — SEMAGLUTIDE (1 MG/DOSE) 4 MG/3ML ~~LOC~~ SOPN
1.0000 mg | PEN_INJECTOR | SUBCUTANEOUS | 0 refills | Status: DC
  Filled 2023-12-07: qty 9, 84d supply, fill #0

## 2023-10-12 NOTE — Telephone Encounter (Signed)
Requested Prescriptions  Pending Prescriptions Disp Refills   Semaglutide, 1 MG/DOSE, 4 MG/3ML SOPN 9 mL 0    Sig: Inject 1 mg as directed once a week.     Endocrinology:  Diabetes - GLP-1 Receptor Agonists - semaglutide Failed - 10/11/2023 10:39 AM      Failed - HBA1C in normal range and within 180 days    Hemoglobin A1C  Date Value Ref Range Status  09/15/2023 7.6 (A) 4.0 - 5.6 % Final   HB A1C (BAYER DCA - WAIVED)  Date Value Ref Range Status  11/17/2020 9.2 (H) <7.0 % Final    Comment:                                          Diabetic Adult            <7.0                                       Healthy Adult        4.3 - 5.7                                                           (DCCT/NGSP) American Diabetes Association's Summary of Glycemic Recommendations for Adults with Diabetes: Hemoglobin A1c <7.0%. More stringent glycemic goals (A1c <6.0%) may further reduce complications at the cost of increased risk of hypoglycemia.    Hgb A1c MFr Bld  Date Value Ref Range Status  06/02/2023 7.9 (H) 4.8 - 5.6 % Final    Comment:             Prediabetes: 5.7 - 6.4          Diabetes: >6.4          Glycemic control for adults with diabetes: <7.0          Passed - Cr in normal range and within 360 days    Creat  Date Value Ref Range Status  12/23/2022 0.79 0.70 - 1.35 mg/dL Final   Creatinine, Urine  Date Value Ref Range Status  12/23/2022 35 20 - 320 mg/dL Final         Passed - Valid encounter within last 6 months    Recent Outpatient Visits           3 weeks ago Essential hypertension   [place] [facility] Margarita Mail, DO   4 months ago Type 2 diabetes mellitus with diabetic polyneuropathy, without long-term current use of insulin [facility])   [place] [facility] Margarita Mail, DO   7 months ago Type 2 diabetes mellitus with diabetic polyneuropathy, without long-term current use of insulin [facility])   [place] [facility] Margarita Mail, DO   9 months ago Type 2 diabetes mellitus with diabetic polyneuropathy, without long-term current use of insulin [facility])   [place] [facility] Margarita Mail, DO   2 years ago Routine general medical examination at a health care facility   [facility] Dorcas Carrow, DO       Future Appointments  In 1 month Elie Goody, MD [facility]   In 2 months Margarita Mail, DO [facility]

## 2023-10-13 ENCOUNTER — Other Ambulatory Visit: Payer: Self-pay

## 2023-10-13 MED ORDER — FLUAD 0.5 ML IM SUSY
PREFILLED_SYRINGE | INTRAMUSCULAR | 0 refills | Status: DC
  Filled 2023-10-13: qty 0.5, 1d supply, fill #0

## 2023-10-24 ENCOUNTER — Encounter: Payer: Self-pay | Admitting: Gastroenterology

## 2023-10-31 ENCOUNTER — Ambulatory Visit: Payer: PPO | Admitting: Certified Registered"

## 2023-10-31 ENCOUNTER — Encounter
Admission: RE | Disposition: A | Discharge status: Home / Self Care | Payer: Self-pay | Source: Home / Self Care | Attending: Gastroenterology

## 2023-10-31 ENCOUNTER — Ambulatory Visit
Admission: RE | Admit: 2023-10-31 | Discharge: 2023-10-31 | Disposition: A | Discharge status: Home / Self Care | Payer: PPO | Attending: Gastroenterology | Admitting: Gastroenterology

## 2023-10-31 DIAGNOSIS — I10 Essential (primary) hypertension: Secondary | ICD-10-CM | POA: Diagnosis not present

## 2023-10-31 DIAGNOSIS — E1151 Type 2 diabetes mellitus with diabetic peripheral angiopathy without gangrene: Secondary | ICD-10-CM | POA: Insufficient documentation

## 2023-10-31 DIAGNOSIS — Z1211 Encounter for screening for malignant neoplasm of colon: Secondary | ICD-10-CM | POA: Diagnosis not present

## 2023-10-31 DIAGNOSIS — Z7984 Long term (current) use of oral hypoglycemic drugs: Secondary | ICD-10-CM | POA: Insufficient documentation

## 2023-10-31 DIAGNOSIS — K5730 Diverticulosis of large intestine without perforation or abscess without bleeding: Secondary | ICD-10-CM | POA: Diagnosis not present

## 2023-10-31 DIAGNOSIS — G4730 Sleep apnea, unspecified: Secondary | ICD-10-CM | POA: Insufficient documentation

## 2023-10-31 DIAGNOSIS — Z7985 Long-term (current) use of injectable non-insulin antidiabetic drugs: Secondary | ICD-10-CM | POA: Insufficient documentation

## 2023-10-31 DIAGNOSIS — Z83719 Family history of colon polyps, unspecified: Secondary | ICD-10-CM | POA: Insufficient documentation

## 2023-10-31 HISTORY — PX: COLONOSCOPY WITH PROPOFOL: SHX5780

## 2023-10-31 SURGERY — COLONOSCOPY WITH PROPOFOL
Anesthesia: General

## 2023-10-31 MED ORDER — PROPOFOL 500 MG/50ML IV EMUL
INTRAVENOUS | Status: DC | PRN
Start: 2023-10-31 — End: 2023-10-31
  Administered 2023-10-31: 125 ug/kg/min via INTRAVENOUS
  Administered 2023-10-31: 40 mg via INTRAVENOUS

## 2023-10-31 MED ORDER — LIDOCAINE HCL (PF) 2 % IJ SOLN
INTRAMUSCULAR | Status: DC | PRN
  Administered 2023-10-31: 1.919 mg/kg/h via INTRADERMAL

## 2023-10-31 MED ORDER — SODIUM CHLORIDE 0.9 % IV SOLN
INTRAVENOUS | Status: DC
  Administered 2023-10-31: 20 mL/h via INTRAVENOUS

## 2023-10-31 NOTE — Anesthesia Postprocedure Evaluation (Signed)
Anesthesia Post Note  Patient: Casey Hunter.  Procedure(s) Performed: COLONOSCOPY WITH PROPOFOL  Patient location during evaluation: Endoscopy Anesthesia Type: General Level of consciousness: awake and alert Pain management: pain level controlled Vital Signs Assessment: post-procedure vital signs reviewed and stable Respiratory status: spontaneous breathing, nonlabored ventilation, respiratory function stable and patient connected to nasal cannula oxygen Cardiovascular status: blood pressure returned to baseline and stable Postop Assessment: no apparent nausea or vomiting Anesthetic complications: no   There were no known notable events for this encounter.   Last Vitals:  Vitals:   10/31/23 0900 10/31/23 0920  BP: 110/65 136/84  Pulse:  81  Resp: 15 17  Temp: (!) 35.6 C   SpO2: 96% 97%    Last Pain:  Vitals:   10/31/23 0920  TempSrc:   PainSc: 0-No pain                 Louie Boston

## 2023-10-31 NOTE — H&P (Signed)
Wyline Mood, MD [address], [address], [place], [place], [address] [address], [address], [place], [place], [address] Phone: [phone]  Fax: [phone]  Primary Care Physician:  Margarita Mail, DO   Pre-Procedure History & Physical: HPI:  Casey Dase. is a 67 y.o. male is here for an colonoscopy.   Past Medical History:  Diagnosis Date   Diabetic peripheral neuropathy associated with type 2 diabetes mellitus 03/14/2017   DOE (dyspnea on exertion) 11/2019   Onset jan 2021 with covid 19  - assoc with atypical cp developed during the infection present mostly sitting/ absent supine/ better with deep breathing/ no worse with ex - 01/30/2020   Walked RA x two laps =  approx 558ft @ brisk pace - stopped due to end of study, no sob/ no change cp  with sats of 93 % at the end of the study and no acute ekg changes  - Echo  02/13/2020  wnl with  no pericadial ef   Fatigue 11/2019   History of COVID-19 11/2019   Hyperlipidemia associated with type 2 diabetes mellitus 09/16/2015   Hypertension associated with type 2 diabetes mellitus 09/16/2015   Knee osteoarthritis 04/14/2016   Mild neurocognitive disorder 06/12/2020   Numbness of lower extremity 11/2019   Attributed to COVID-19   Sleep apnea    Patient denied being diagnosed with OSA in the past    Past Surgical History:  Procedure Laterality Date   HERNIA REPAIR      Prior to Admission medications   Medication Sig Start Date End Date Taking? Authorizing Provider  acetaminophen (TYLENOL) 650 MG CR tablet Take 1,300 mg by mouth as needed.    [provider]  aspirin 81 MG tablet Take 81 mg by mouth daily.    [provider]  Continuous Blood Gluc Receiver (FREESTYLE LIBRE 2 READER) DEVI Apply 1 Units topically daily. Use as directed. 12/10/22   Raulkar, Drema Pry, MD  Continuous Blood Gluc Sensor (FREESTYLE LIBRE 2 SENSOR) MISC Apply 1 Units topically daily. Use as directed. 12/10/22   Raulkar, Drema Pry,  MD  dapagliflozin propanediol (FARXIGA) 10 MG TABS tablet Take 1 tablet (10 mg total) by mouth daily. 09/15/23   Margarita Mail, DO  Evolocumab (REPATHA SURECLICK) 140 MG/ML SOAJ Inject 140 mg into the skin every 14 (fourteen) days. Has failed statins, Zetia, and Niacin 09/15/23   Margarita Mail, DO  influenza vaccine adjuvanted (FLUAD) 0.5 ML injection Inject into the muscle. 10/13/23   Judyann Munson, MD  meloxicam (MOBIC) 15 MG tablet TAKE 1 TABLET BY MOUTH DAILY AS  NEEDED FOR PAIN 04/05/22   Raulkar, Drema Pry, MD  metFORMIN (GLUCOPHAGE) 1000 MG tablet Take 1 tablet (1,000 mg total) by mouth 2 (two) times daily with a meal. 03/14/23   Margarita Mail, DO  NIFEdipine (PROCARDIA-XL/NIFEDICAL-XL) 30 MG 24 hr tablet Take 1 tablet (30 mg total) by mouth daily. 10/07/23   Raulkar, Drema Pry, MD  pantoprazole (PROTONIX) 20 MG tablet Take 1 tablet (20 mg total) by mouth daily. 09/15/23   Margarita Mail, DO  pregabalin (LYRICA) 75 MG capsule Take 1 capsule (75 mg total) by mouth 3 (three) times daily. 09/05/23   Raulkar, Drema Pry, MD  Semaglutide, 1 MG/DOSE, 4 MG/3ML SOPN Inject 1 mg as directed once a week. 09/15/23   Margarita Mail, DO  Semaglutide, 1 MG/DOSE, 4 MG/3ML SOPN Inject 1 mg as directed once a week. 10/12/23   Margarita Mail, DO  tamsulosin [facility])  0.4 MG CAPS capsule Take 2 capsules (0.8 mg total) by mouth daily after supper. 10/07/23   Raulkar, Drema Pry, MD  Vitamin D, Ergocalciferol, (DRISDOL) 1.25 MG (50000 UNIT) CAPS capsule Take 1 capsule (50,000 Units total) by mouth every 7 (seven) days. 12/02/22   Horton Chin, MD    Allergies as of 06/16/2023 - Review Complete 06/14/2023  Allergen Reaction Noted   Crestor [rosuvastatin calcium]  01/08/2016   Elemental sulfur  09/16/2015   Lipitor [atorvastatin]  01/08/2016   Penicillin g potassium [penicillin g]  09/16/2015    Family History  Problem Relation Age of Onset   Hypertension Mother     Hyperlipidemia Mother    Hyperlipidemia Father     Social History   Socioeconomic History   Marital status: Divorced    Spouse name: Not on file   Number of children: 2   Years of education: 12   Highest education level: High school graduate  Occupational History   Not on file  Tobacco Use   Smoking status: Never   Smokeless tobacco: Never  Vaping Use   Vaping status: Never Used  Substance and Sexual Activity   Alcohol use: No    Alcohol/week: 0.0 standard drinks of alcohol   Drug use: No   Sexual activity: Not Currently  Other Topics Concern   Not on file  Social History Narrative   Lives alone   Social Determinants of Health   Financial Resource Strain: Low Risk  (05/06/2023)   Overall Financial Resource Strain (CARDIA)    Difficulty of Paying Living Expenses: Not hard at all  Food Insecurity: Unknown (05/06/2023)   Hunger Vital Sign    Worried About Running Out of Food in the Last Year: Never true    Ran Out of Food in the Last Year: Not on file  Transportation Needs: No Transportation Needs (05/06/2023)   PRAPARE - Administrator, Civil Service (Medical): No    Lack of Transportation (Non-Medical): No  Physical Activity: Sufficiently Active (05/06/2023)   Exercise Vital Sign    Days of Exercise per Week: 5 days    Minutes of Exercise per Session: 60 min  Stress: Stress Concern Present (05/06/2023)   [employer] of Occupational Health - Occupational Stress Questionnaire    Feeling of Stress : Rather much  Social Connections: Moderately Isolated (05/06/2023)   Social Connection and Isolation Panel [NHANES]    Frequency of Communication with Friends and Family: Twice a week    Frequency of Social Gatherings with Friends and Family: Three times a week    Attends Religious Services: Never    Active Member of Clubs or Organizations: Not on file    Attends Club or Organization Meetings: More than 4 times per year    Marital Status: Divorced  Intimate  Partner Violence: Not At Risk (05/06/2023)   Humiliation, Afraid, Rape, and Kick questionnaire    Fear of Current or Ex-Partner: No    Emotionally Abused: No    Physically Abused: No    Sexually Abused: No    Review of Systems: See HPI, otherwise negative ROS  Physical Exam: There were no vitals taken for this visit. General:   Alert,  pleasant and cooperative in NAD Head:  Normocephalic and atraumatic. Neck:  Supple; no masses or thyromegaly. Lungs:  Clear throughout to auscultation, normal respiratory effort.    Heart:  +S1, +S2, Regular rate and rhythm, No edema. Abdomen:  Soft, nontender and nondistended. Normal bowel sounds, without  guarding, and without rebound.   Neurologic:  Alert and  oriented x4;  grossly normal neurologically.  Impression/Plan: Casey Hunter. is here for an colonoscopy to be performed for family history of colon polyps. Risks, benefits, limitations, and alternatives regarding  colonoscopy have been reviewed with the patient.  Questions have been answered.  All parties agreeable.   Wyline Mood, MD  10/31/2023, 8:02 AM

## 2023-10-31 NOTE — Transfer of Care (Signed)
Immediate Anesthesia Transfer of Care Note  Patient: Casey Hunter.  Procedure(s) Performed: COLONOSCOPY WITH PROPOFOL  Patient Location: PACU  Anesthesia Type:General  Level of Consciousness: awake, alert , and oriented  Airway & Oxygen Therapy: Patient Spontanous Breathing  Post-op Assessment: Report given to RN and Post -op Vital signs reviewed and stable  Post vital signs: stable  Last Vitals:  Vitals Value Taken Time  BP 110/65 10/31/23 0901  Temp 35.6 C 10/31/23 0900  Pulse 84 10/31/23 0902  Resp 15 10/31/23 0902  SpO2 93 % 10/31/23 0902  Vitals shown include unfiled device data.  Last Pain:  Vitals:   10/31/23 0900  TempSrc: Temporal  PainSc: 0-No pain         Complications: No notable events documented.

## 2023-10-31 NOTE — Op Note (Signed)
[facility] Patient Name: Casey Hunter Procedure Date: 10/31/2023 8:25 AM MRN: [id number] Account #: [id number] Date of Birth: Jun 21, 1956 Admit Type: Outpatient Age: 67 Room: [facility] ENDO ROOM 1 Gender: Male Note Status: Finalized Instrument Name: Prentice Docker [id number] Procedure:             Colonoscopy Indications:           Colon cancer screening in patient at increased risk:                         Family history of 1st-degree relative with colon polyps Providers:             Wyline Mood MD, MD Referring MD:          Margarita Mail (Referring MD) Medicines:             Monitored Anesthesia Care Complications:         No immediate complications. Procedure:             Pre-Anesthesia Assessment:                        - Prior to the procedure, a History and Physical was                         performed, and patient medications, allergies and                         sensitivities were reviewed. The patient's tolerance                         of previous anesthesia was reviewed.                        - The risks and benefits of the procedure and the                         sedation options and risks were discussed with the                         patient. All questions were answered and informed                         consent was obtained.                        - ASA Grade Assessment: II - A patient with mild                         systemic disease.                        After obtaining informed consent, the colonoscope was                         passed under direct vision. Throughout the procedure,                         the patient's blood pressure, pulse, and oxygen  saturations were monitored continuously. The                         Colonoscope was introduced through the anus and                         advanced to the the cecum, identified by the                         appendiceal orifice. The colonoscopy was  performed                         with ease. The patient tolerated the procedure well.                         The quality of the bowel preparation was                         unsatisfactory. Findings:      The perianal and digital rectal examinations were normal.      A few small-mouthed diverticula were found in the sigmoid colon.      The exam was otherwise without abnormality on direct and retroflexion       views. Impression:            - Preparation of the colon was unsatisfactory.                        - Diverticulosis in the sigmoid colon.                        - The examination was otherwise normal on direct and                         retroflexion views.                        - No specimens collected. Recommendation:        - Discharge patient to home (with escort).                        - Resume previous diet.                        - Continue present medications.                        - Repeat colonoscopy in 1 month because the bowel                         preparation was suboptimal. Procedure Code(s):     --- Professional ---                        [phone], Colonoscopy, flexible; diagnostic, including                         collection of specimen(s) by brushing or washing, when                         performed (separate procedure) Diagnosis Code(s):     ---  Professional ---                        Z83.71, Family history of colonic polyps                        K57.30, Diverticulosis of large intestine without                         perforation or abscess without bleeding CPT copyright 2022 [employer]. All rights reserved. The codes documented in this report are preliminary and upon coder review may  be revised to meet current compliance requirements. Wyline Mood, MD Wyline Mood MD, MD 10/31/2023 8:57:50 AM This report has been signed electronically. Number of Addenda: 0 Note Initiated On: 10/31/2023 8:25 AM Scope Withdrawal Time: 0 hours 7 minutes  58 seconds  Total Procedure Duration: 0 hours 16 minutes 1 second  Estimated Blood Loss:  Estimated blood loss: none.      [facility]

## 2023-10-31 NOTE — Anesthesia Preprocedure Evaluation (Signed)
Anesthesia Evaluation  Patient identified by MRN, date of birth, ID band Patient awake    Reviewed: Allergy & Precautions, NPO status , Patient's Chart, lab work & pertinent test results  History of Anesthesia Complications Negative for: history of anesthetic complications  Airway Mallampati: III  TM Distance: >3 FB Neck ROM: full    Dental no notable dental hx.    Pulmonary sleep apnea and Continuous Positive Airway Pressure Ventilation    Pulmonary exam normal        Cardiovascular hypertension, On Medications (-) angina + DOE (stable)  Normal cardiovascular exam     Neuro/Psych  Neuromuscular disease  negative psych ROS   GI/Hepatic negative GI ROS, Neg liver ROS,,,  Endo/Other  negative endocrine ROSdiabetes    Renal/GU negative Renal ROS  negative genitourinary   Musculoskeletal   Abdominal   Peds  Hematology negative hematology ROS (+)   Anesthesia Other Findings Past Medical History: 03/14/2017: Diabetic peripheral neuropathy associated with type 2  diabetes mellitus 11/2019: DOE (dyspnea on exertion)     Comment:  Onset jan 2021 with covid 19  - assoc with atypical cp               developed during the infection present mostly sitting/               absent supine/ better with deep breathing/ no worse with               ex - 01/30/2020   Walked RA x two laps =  approx 559ft @               brisk pace - stopped due to end of study, no sob/ no               change cp  with sats of 93 % at the end of the study and               no acute ekg changes  - Echo  02/13/2020  wnl with  no               pericadial ef 11/2019: Fatigue 11/2019: History of COVID-19 09/16/2015: Hyperlipidemia associated with type 2 diabetes mellitus 09/16/2015: Hypertension associated with type 2 diabetes mellitus 04/14/2016: Knee osteoarthritis 06/12/2020: Mild neurocognitive disorder 11/2019: Numbness of lower extremity     Comment:   Attributed to COVID-19 No date: Sleep apnea     Comment:  Patient denied being diagnosed with OSA in the past  Past Surgical History: No date: HERNIA REPAIR  BMI    Body Mass Index: 29.50 kg/m      Reproductive/Obstetrics negative OB ROS                             Anesthesia Physical Anesthesia Plan  ASA: 3  Anesthesia Plan: General   Post-op Pain Management: Minimal or no pain anticipated   Induction: Intravenous  PONV Risk Score and Plan: 1 and Propofol infusion and TIVA  Airway Management Planned: Natural Airway and Nasal Cannula  Additional Equipment:   Intra-op Plan:   Post-operative Plan:   Informed Consent: I have reviewed the patients History and Physical, chart, labs and discussed the procedure including the risks, benefits and alternatives for the proposed anesthesia with the patient or authorized representative who has indicated his/her understanding and acceptance.     Dental Advisory Given  Plan Discussed with: Anesthesiologist, CRNA and Surgeon  Anesthesia  Plan Comments: (Patient consented for risks of anesthesia including but not limited to:  - adverse reactions to medications - risk of airway placement if required - damage to eyes, teeth, lips or other oral mucosa - nerve damage due to positioning  - sore throat or hoarseness - Damage to heart, brain, nerves, lungs, other parts of body or loss of life  Patient voiced understanding and assent.)       Anesthesia Quick Evaluation

## 2023-11-01 ENCOUNTER — Encounter: Payer: PPO | Attending: Physical Medicine and Rehabilitation | Admitting: Physical Medicine and Rehabilitation

## 2023-11-01 ENCOUNTER — Encounter: Payer: Self-pay | Admitting: Gastroenterology

## 2023-11-01 DIAGNOSIS — R4189 Other symptoms and signs involving cognitive functions and awareness: Secondary | ICD-10-CM | POA: Diagnosis not present

## 2023-11-01 MED ORDER — FREESTYLE LIBRE 2 SENSOR MISC: 1.0000 [IU] | Freq: Every day | 3 refills | Status: DC

## 2023-11-01 MED ORDER — FREESTYLE LIBRE 2 READER DEVI: 1.0000 [IU] | Freq: Every day | 3 refills | Status: DC

## 2023-11-01 NOTE — Progress Notes (Signed)
Subjective:    Patient ID: Casey Greenspan., male    DOB: 12-11-1955, 67 y.o.   MRN: [id number]  HPI   Casey Hunter returns for f/u LONG COVID, Vitamin D deficiency, and HLD, and type 2 diabetes  1) Long COVID symptoms -feeling a lot better -eating oysters, salmon, liver once per week- continues with this -still has some fatigue -left leg still falls asleep- ready to repeat Qutenza today -some nights it can be pretty bad -it is not usually painful -he is trying to work on his diet. -he is taking the Lyrica two at night before sleeping -he has taken some ivermectin-was taking it every day when he felt bad.  -he asks about taking Pepcid and Claritin -does feel much better -feels like he is getting enough protein -he does not eat much citrus fruits due to his diabetes.  -he loves to eat strawberries and blackberries -checked testosterone level and discussed that his levels  -discussed vitamin D level and high dose supplementation -lot of stress since March 1st since his dad felt and broke his hip  2) Diabetic peripheral neuropathy: -pain has been stable -has been trying to give up bread and potatoes and all his sweets -both feet feel cold, continues to take nifedipine.  -Qutenza helps, would like to try again today -hemoglobin A1c 7.8 on 9/15, increased to 9.9 in January, and 10.1 on recent check -he has been very stressed by his parents' health -ready to repeat Qutenza today -CBGs better controlled- less than 150s -eating 1 egg at breakfast and drinks unsweet tea -weight is 248 lbs today -discussed his diet thoroughly.  -got 60% improvement with Qutenza first time, even better improvement this last time but he can't quantify it.  -restarted Trulicity -Ozempic was denied for him.  -pain is mostly present on soles of both feet- sometimes in his toes -tolerated last session well without any burning -discussed his hemoglobin A1c  3) BPH: -the tamsulosin has been helping  him. -only urinating twice per night  4) Fatigue -Contnues to have severe fatigue at times.  -yesterday was a bad tay -panning to return in August.   5) HTN: -BP is 118/75 -has been well controlled -now off all blood pressure medications except for flomax 0.4mg .  -he has been checking BP at home and it has been stable.  -he likes cinnamon  6) Raynaud's syndrome: -the nifedipine helped his upper extremity neuropathy but his lower extremities are still very cold at night -he continues to take this  7) History of multiple acute COVID infections -he asks why he is getting these infections every year -he is feeling better than when he called me on Saturday -he asks for how long he should quarantine.   8) Vitamin D deficiency -discussed that level has improved to 40s, and has remained stable there since we checked 4 months ago -discussed that this was an improvement from the first time we checked -prescribed refill for 20 weeks of weekly high dose supplement -would like to repeat today  9) elevated triglycerides -recommended avoiding processed fodos -repatha was not covered and he would like to see   10) Accident on Friday -large piece of wood hit him in his face.  -broke his nose -had 6 stitches -really scared him  11) Impaired cognition: -his brain function is not what was it was before COVID but it much removed  He continues to have numbness in his left calf. It is better than initially. His left hand  will also sleep. It hurts but he is not sure if this is pain.  He has been having increase readings on his Freestyle Libre device- around 200s. For breakfast he eats eggs and sausage. For lunch he eats barebcue and sometimes french fries. Once in a while he eats sugar. He drinks water and unsweet tea and a fruit drink. He eats two bananas every day.   He has been checking pressures every day and they have been well controlled. He takes his BP medication 5 days per  week.  Prior history: Dizziness is better, but still present at times. He has brought with him an excellent log of his AM and PM blood pressures, which are excellent off any blood pressure medications.   Obesity: He has made excellent changes to his diet. He has lost 2 lbs since last visit (currently 266 lbs). He has been eating one banana per day, eggs daily, salmon once per week, meat, and a lot of nuts. He feels he still needs to minimize sweets.   Energy: Has greatly improved but is still not at the level required for his work. He has weaned off the Ritalin. Discussed goal of restarting work on October 11th for three days per week and he is agreeable.   Reviewed PCP note and he has been doing much better. HbA1c has improved to 7.7  Left leg numbness continues but is slightly improved.     Pain Inventory Average Pain 4 Pain Right Now 7 My pain is intermittent, constant, and tingling  In the last 24 hours, has pain interfered with the following? General activity 4 Relation with others 2 Enjoyment of life 5 What TIME of day is your pain at its worst? varies Sleep (in general) Fair  Pain is worse with: walking, bending, and some activites Pain improves with: medication Relief from Meds: 5  Family History  Problem Relation Age of Onset   Hypertension Mother    Hyperlipidemia Mother    Hyperlipidemia Father    Social History   Socioeconomic History   Marital status: Divorced    Spouse name: Not on file   Number of children: 2   Years of education: 12   Highest education level: High school graduate  Occupational History   Not on file  Tobacco Use   Smoking status: Never   Smokeless tobacco: Never  Vaping Use   Vaping status: Never Used  Substance and Sexual Activity   Alcohol use: No    Alcohol/week: 0.0 standard drinks of alcohol   Drug use: No   Sexual activity: Not Currently  Other Topics Concern   Not on file  Social History Narrative   Lives alone    Social Determinants of Health   Financial Resource Strain: Low Risk  (05/06/2023)   Overall Financial Resource Strain (CARDIA)    Difficulty of Paying Living Expenses: Not hard at all  Food Insecurity: Unknown (05/06/2023)   Hunger Vital Sign    Worried About Running Out of Food in the Last Year: Never true    Ran Out of Food in the Last Year: Not on file  Transportation Needs: No Transportation Needs (05/06/2023)   PRAPARE - Administrator, Civil Service (Medical): No    Lack of Transportation (Non-Medical): No  Physical Activity: Sufficiently Active (05/06/2023)   Exercise Vital Sign    Days of Exercise per Week: 5 days    Minutes of Exercise per Session: 60 min  Stress: Stress Concern Present (05/06/2023)  [employer] of Occupational Health - Occupational Stress Questionnaire    Feeling of Stress : Rather much  Social Connections: Moderately Isolated (05/06/2023)   Social Connection and Isolation Panel [NHANES]    Frequency of Communication with Friends and Family: Twice a week    Frequency of Social Gatherings with Friends and Family: Three times a week    Attends Religious Services: Never    Active Member of Clubs or Organizations: Not on file    Attends Club or Organization Meetings: More than 4 times per year    Marital Status: Divorced   Past Surgical History:  Procedure Laterality Date   COLONOSCOPY WITH PROPOFOL N/A 10/31/2023   Procedure: COLONOSCOPY WITH PROPOFOL;  Surgeon: Wyline Mood, MD;  Location[facility] ENDOSCOPY;  Service: Gastroenterology;  Laterality: N/A;   HERNIA REPAIR     Past Surgical History:  Procedure Laterality Date   COLONOSCOPY WITH PROPOFOL N/A 10/31/2023   Procedure: COLONOSCOPY WITH PROPOFOL;  Surgeon: Wyline Mood, MD;  Location: [facility] ENDOSCOPY;  Service: Gastroenterology;  Laterality: N/A;   HERNIA REPAIR     Past Medical History:  Diagnosis Date   Diabetic peripheral neuropathy associated with type 2 diabetes mellitus  03/14/2017   DOE (dyspnea on exertion) 11/2019   Onset jan 2021 with covid 19  - assoc with atypical cp developed during the infection present mostly sitting/ absent supine/ better with deep breathing/ no worse with ex - 01/30/2020   Walked RA x two laps =  approx 553ft @ brisk pace - stopped due to end of study, no sob/ no change cp  with sats of 93 % at the end of the study and no acute ekg changes  - Echo  02/13/2020  wnl with  no pericadial ef   Fatigue 11/2019   History of COVID-19 11/2019   Hyperlipidemia associated with type 2 diabetes mellitus 09/16/2015   Hypertension associated with type 2 diabetes mellitus 09/16/2015   Knee osteoarthritis 04/14/2016   Mild neurocognitive disorder 06/12/2020   Numbness of lower extremity 11/2019   Attributed to COVID-19   Sleep apnea    Patient denied being diagnosed with OSA in the past   There were no vitals taken for this visit.  Opioid Risk Score:   Fall Risk Score:  `1  Depression screen PHQ 2/9     09/15/2023    1:15 PM 06/14/2023    1:04 PM 06/02/2023    9:43 AM 05/06/2023    8:27 AM 03/14/2023    1:15 PM 03/03/2023    9:22 AM 12/23/2022    8:51 AM  Depression screen PHQ 2/9  Decreased Interest 0 0 0 0 0 0 0  Down, Depressed, Hopeless 0 0 0 0 0 0 0  PHQ - 2 Score 0 0 0 0 0 0 0  Altered sleeping 0 0  0 0  0  Tired, decreased energy 0 0   0  0  Change in appetite 0 0   0  0  Feeling bad or failure about yourself  0 0   0  0  Trouble concentrating 0 0   0  0  Moving slowly or fidgety/restless 0 0   0  0  Suicidal thoughts 0 0   0  0  PHQ-9 Score 0 0  0 0  0  Difficult doing work/chores Not difficult at all Not difficult at all   Not difficult at all  Not difficult at all   Review of Systems  Constitutional: Negative.  HENT: Negative.    Eyes: Negative.   Respiratory: Negative.    Cardiovascular: Negative.   Gastrointestinal: Negative.   Endocrine: Negative.   Genitourinary: Negative.   Musculoskeletal:  Positive for gait  problem.       Left shoulder pain , right and left knee pain , left leg pain  Skin: Negative.   Allergic/Immunologic: Negative.   Hematological: Negative.   Psychiatric/Behavioral: Negative.         Objective:   Physical Exam PRIOR EXAM Gen: no distress, normal appearing, BMI 29.61, weight 224 lbs, BP 118/75 Gen: no distress, normal appearing HEENT: oral mucosa pink and moist, NCAT Cardio: Reg rate Chest: normal effort, normal rate of breathing Abd: soft, non-distended Ext: no edema Psych: pleasant, normal affect Skin: intact, no open lesions Neuro: Alert and oriented x3     Assessment & Plan:  Casey Hunter is a 67 year old man who presents for follow-up on LONG COVID symptoms.   1) HTN: Improved control. Discontinue Losartan -continue flomax -continue eating a banana every day -continue grapefruit at least once per week.  -Advised checking BP daily at home and logging results to bring into follow-up appointment with her PCP and myself. -Reviewed BP meds today.  -Advised checking BP daily at home and logging results to bring into follow-up appointment with PCP and myself. -Reviewed BP meds today.  -Advised regarding healthy foods that can help lower blood pressure and provided with a list: 1) citrus foods- high in vitamins and minerals 2) salmon and other fatty fish - reduces inflammation and oxylipins 3) swiss chard (leafy green)- high level of nitrates 4) pumpkin seeds- one of the best natural sources of magnesium 5) Beans and lentils- high in fiber, magnesium, and potassium 6) Berries- high in flavonoids 7) Amaranth (whole grain, can be cooked similarly to rice and oats)- high in magnesium and fiber 8) Pistachios- even more effective at reducing BP than other nuts 9) Carrots- high in phenolic compounds that relax blood vessels and reduce inflammation 10) Celery- contain phthalides that relax tissues of arterial walls 11) Tomatoes- can also improve cholesterol and reduce  risk of heart disease 12) Broccoli- good source of magnesium, calcium, and potassium 13) Greek yogurt: high in potassium and calcium 14) Herbs and spices: Celery seed, cilantro, saffron, lemongrass, black cumin, ginseng, cinnamon, cardamom, sweet basil, and ginger 15) Chia and flax seeds- also help to lower cholesterol and blood sugar 16) Beets- high levels of nitrates that relax blood vessels  17) spinach and bananas- high in potassium  -Provided lise of supplements that can help with hypertension:  1) magnesium: one high quality brand is Bioptemizers since it contains all 7 types of magnesium, otherwise over the counter magnesium gluconate 400mg  is a good option 2) B vitamins 3) vitamin D 4) potassium 5) CoQ10 6) L-arginine 7) Vitamin C 8) Beetroot -Educated that goal BP is 120/80. -Made goal to incorporate some of the above foods into diet.     2) Fatigue: Improved but still present. Continue NAC 600mg  BID and gave list of foods that increase NAC. Off Ritalin. Increase work to 4 days per week until re-eval on April 1st. Provided with note. This is one of the reason he feels he needs to retire.   3) Vitamin D deficiency -continue ergocalciferol 50,000U once per week for 7 weeks  4) Leg numbness: Persists, but improved. Explained etiologies of nerve injury 2/2 COVID-19 vs. Diabetic neuropathy.   5) History of Obesity, now overweight: Weight 224, lost  48 lbs in the past year- He eats salmon once per week, 1 egg daily, nuts, meat, olive oil. Made goal to avoid sugar.  -Discussed the benefits of intermittent fasting. Recommended starting with pushing dinner 15 minutes earlier and when this feels easy, continuing to push dinner 15 minutes earlier. Discussed that this can help her body to improve its ability to burn fat rather than glucose, improving insulin sensitivity. Recommended drinking Roobois tea in the evening to help curb appetite and for its numerous health benefits. -continue  Ozempic prescribed by his PCP   -Educated regarding health benefits of weight loss- for pain, general health, chronic disease prevention, immune health, mental health.  -Will monitor weight every visit.  -Consider Roobois tea daily.  -Discussed the benefits of intermittent fasting. -Discussed foods that can assist in weight loss: 1) leafy greens- high in fiber and nutrients 2) dark chocolate- improves metabolism (if prefer sweetened, best to sweeten with honey instead of sugar).  3) cruciferous vegetables- high in fiber and protein 4) full fat yogurt: high in healthy fat, protein, calcium, and probiotics 5) apples- high in a variety of phytochemicals 6) nuts- high in fiber and protein that increase feelings of fullness 7) grapefruit: rich in nutrients, antioxidants, and fiber (not to be taken with anticoagulation) 8) beans- high in protein and fiber 9) salmon- has high quality protein and healthy fats 10) green tea- rich in polyphenols 11) eggs- rich in choline and vitamin D 12) tuna- high protein, boosts metabolism 13) avocado- decreases visceral abdominal fat 14) chicken (pasture raised): high in protein and iron 15) blueberries- reduce abdominal fat and cholesterol 16) whole grains- decreases calories retained during digestion, speeds metabolism 17) chia seeds- curb appetite 18) chilies- increases fat metabolism   6) DM 2:  Discussed HgbA1c worsening to 10.1, repeat HgbA1c today -discussed dietary changes -discussed that stress, lack of sleep, and recently worsening diet could have contributed -encouraging trying to find an aide to help with his mother's care so he can sleep at night -restarted metformin 500mg  daily.  -check CBGs daily, log, and bring log to follow-up appointment -avoid sugar, bread, pasta, rice -avoid snacking -Recommended 1 glass water with 1 TB apple cider vinegar before meals to reduce CBG spike, has additional health benefits, drink with straw to protect  enamel.   -discussed that his PCP changed him from Trulicity to [employer] -perform daily foot exam and at least annual eye exam -check CBGs daily, log, and bring log to follow-up appointment -avoid sugar, bread, pasta, rice -avoid snacking -perform daily foot exam and at least annual eye exam -try to incorporate into your diet some of the following foods which are good for diabetes: 1) cinnamon- imitates effects of insulin, increasing glucose transport into cells ([place] cinnamon is best, least processed) 2) nuts- can slow down the blood sugar response of carbohydrate rich foods 3) oatmeal- contains and anti-inflammatory compound avenanthramide 4) whole-milk yogurt (best types are no sugar, [place] yogurt, or goat/sheep yogurt) 5) beans- high in protein, fiber, and vitamins, low glycemic index 6) broccoli- great source of vitamin A and C 7) quinoa- higher in protein and fiber than other grains 8) spinach- high in vitamin A, fiber, and protein 9) olive oil- reduces glucose levels, LDL, and triglycerides 10) salmon- excellent amount of omega-3-fatty acids 11) walnuts- rich in antioxidants 12) apples- high in fiber and quercetin 13) carrots- highly nutritious with low impact on blood sugar 14) eggs- improve HDL (good cholesterol), high in protein, keep you  satiated 15) turmeric: improves blood sugars, cardiovascular disease, and protects kidney health 16) garlic: improves blood sugar, blood pressure, pain 17) tomatoes: highly nutritious with low impact on blood sugar  7) HLD -really improved on last labs! -continue Rapatha -discussed results of 10/5 lipid panel which is within normal limits except for elevated triglycerides.   8) Diabetic peripheral neuropathy Refilled Lyrica -discussed that he has been trying to cut out sweets, bread, and potatoes -discussed positive response to Qutenza -Discussed Qutenza as an option for neuropathic pain control. Discussed that this is a  capsaicin patch, stronger than capsaicin cream. Discussed that it is currently approved for diabetic peripheral neuropathy and post-herpetic neuralgia, but that it has also shown benefit in treating other forms of neuropathy. Provided patient with link to site to learn more about the patch: https://www.clark.biz/. Discussed that the patch would be placed in office and benefits usually last 3 months. Discussed that unintended exposure to capsaicin can cause severe irritation of eyes, mucous membranes, respiratory tract, and skin, but that Qutenza is a local treatment and does not have the systemic side effects of other nerve medications. Discussed that there may be pain, itching, erythema, and decreased sensory function associated with the application of Qutenza. Side effects usually subside within 1 week. A cold pack of analgesic medications can help with these side effects. Blood pressure can also be increased due to pain associated with administration of the patch.   Prescribed Zynex Nexwave  4 patches of Qutenza was applied to the area of pain. Ice packs were applied during the procedure to ensure patient comfort. Blood pressure was monitored every 15 minutes. The patient tolerated the procedure well. Post-procedure instructions were given and follow-up has been scheduled.    9) BPH: -continue flomax to 0.8mg  HS    10) Decreased muscle mass, brain fog, decreased strength: -improving  11) Elevated PSA -normalized on 11/22 and 10/6 labs  12) testosterone deficiency: -normalized with below interventions to 420 on 10/6 -will check testosterone next visit.  -encouraged vitamin D supplementation, resistance training, high protein/low carb diet.  -discussed that this has normalized.  -check testosterone today -continue liver once in a while for nutritional benefits.   13) History of multiple acute COVID infections -prescribed pepcid on Saturday and patient is now feeling better -discussed at  least 5 day quarantine and until symptoms resolve -discussed his ivermectin use -discussed foods rich in vitamin C and zinc to help boost immune system to reduce severe infections in the future -discussed risks and benefits of COVID vaccines  14) Cataracts -discussed plan for corrective lessons.  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   13 minutes spent in discussion of impaired cognition, that his mother has late onset dementia, discussed his colonoscopy yesterday, discussed that he followed his directions to the T for the colonoscopy, reviewed his report with him from yesterday, discussed  that his father passed blood when he was 48 and they were going to do a colonoscopy

## 2023-11-07 DIAGNOSIS — G629 Polyneuropathy, unspecified: Secondary | ICD-10-CM | POA: Diagnosis not present

## 2023-11-17 ENCOUNTER — Telehealth: Payer: Self-pay

## 2023-11-17 NOTE — Telephone Encounter (Signed)
Spoke with patient to schedule repeat Colonoscopy due to bowel prep suboptimal- patient stated he followed all directions and he refused to repeat colonoscopy at this time- he stated he will call back in one year.

## 2023-11-25 ENCOUNTER — Telehealth: Payer: Self-pay

## 2023-11-25 DIAGNOSIS — E1142 Type 2 diabetes mellitus with diabetic polyneuropathy: Secondary | ICD-10-CM

## 2023-11-25 MED ORDER — QUTENZA (4 PATCH) 8 % EX KIT
4.0000 | PACK | Freq: Once | CUTANEOUS | 0 refills | Status: AC
Start: 2023-12-08 — End: 2023-12-08

## 2023-11-25 NOTE — Telephone Encounter (Signed)
 Patches sent to specialty pharmacy

## 2023-11-28 ENCOUNTER — Telehealth: Payer: Self-pay

## 2023-11-28 NOTE — Telephone Encounter (Signed)
Patches sent to pharmacy

## 2023-11-30 ENCOUNTER — Ambulatory Visit: Payer: PPO | Admitting: Dermatology

## 2023-12-05 ENCOUNTER — Telehealth: Payer: Self-pay

## 2023-12-05 NOTE — Telephone Encounter (Signed)
 PA for patches submitted   Casey Hunter (Key: [id number])

## 2023-12-07 ENCOUNTER — Other Ambulatory Visit: Payer: Self-pay | Admitting: Internal Medicine

## 2023-12-07 ENCOUNTER — Other Ambulatory Visit (HOSPITAL_COMMUNITY): Payer: Self-pay

## 2023-12-07 DIAGNOSIS — K219 Gastro-esophageal reflux disease without esophagitis: Secondary | ICD-10-CM

## 2023-12-08 ENCOUNTER — Other Ambulatory Visit: Payer: Self-pay

## 2023-12-08 ENCOUNTER — Other Ambulatory Visit (HOSPITAL_COMMUNITY): Payer: Self-pay

## 2023-12-08 ENCOUNTER — Encounter: Payer: PPO | Attending: Physical Medicine and Rehabilitation | Admitting: Physical Medicine and Rehabilitation

## 2023-12-08 VITALS — BP 142/75 | HR 67 | Ht 74.0 in | Wt 240.0 lb

## 2023-12-08 DIAGNOSIS — I10 Essential (primary) hypertension: Secondary | ICD-10-CM | POA: Diagnosis not present

## 2023-12-08 DIAGNOSIS — Z794 Long term (current) use of insulin: Secondary | ICD-10-CM

## 2023-12-08 DIAGNOSIS — G629 Polyneuropathy, unspecified: Secondary | ICD-10-CM | POA: Diagnosis not present

## 2023-12-08 DIAGNOSIS — E1142 Type 2 diabetes mellitus with diabetic polyneuropathy: Secondary | ICD-10-CM | POA: Diagnosis not present

## 2023-12-08 MED ORDER — CAPSAICIN-CLEANSING GEL 8 % EX KIT
4.0000 | PACK | Freq: Once | CUTANEOUS | Status: AC
  Administered 2023-12-08: 4 via TOPICAL

## 2023-12-08 MED ORDER — PREGABALIN 75 MG PO CAPS
75.0000 mg | ORAL_CAPSULE | Freq: Four times a day (QID) | ORAL | 3 refills | Status: DC
  Filled 2023-12-08 – 2024-01-16 (×4): qty 300, 75d supply, fill #0
  Filled 2024-02-24: qty 270, 90d supply, fill #0

## 2023-12-08 MED ORDER — NIFEDIPINE ER OSMOTIC RELEASE 30 MG PO TB24
30.0000 mg | ORAL_TABLET | Freq: Every day | ORAL | 3 refills | Status: DC
  Filled 2023-12-08 – 2024-03-23 (×4): qty 90, 90d supply, fill #0

## 2023-12-08 MED ORDER — METFORMIN HCL 1000 MG PO TABS
1000.0000 mg | ORAL_TABLET | Freq: Two times a day (BID) | ORAL | 3 refills | Status: AC
  Filled 2023-12-08 – 2024-03-23 (×4): qty 180, 90d supply, fill #0
  Filled 2024-08-07: qty 180, 90d supply, fill #1

## 2023-12-08 MED ORDER — TAMSULOSIN HCL 0.4 MG PO CAPS
0.8000 mg | ORAL_CAPSULE | Freq: Every day | ORAL | 3 refills | Status: DC
  Filled 2023-12-08 – 2024-03-23 (×4): qty 180, 90d supply, fill #0

## 2023-12-08 MED ORDER — FREESTYLE LIBRE 2 READER DEVI
1.0000 [IU] | Freq: Every day | 3 refills | Status: DC
  Filled 2023-12-08: qty 1, 28d supply, fill #0
  Filled 2024-01-16 (×2): qty 1, 90d supply, fill #0
  Filled 2024-01-18: qty 1, 30d supply, fill #0

## 2023-12-08 MED ORDER — PANTOPRAZOLE SODIUM 20 MG PO TBEC
20.0000 mg | DELAYED_RELEASE_TABLET | Freq: Every day | ORAL | 0 refills | Status: DC
  Filled 2023-12-08: qty 30, 30d supply, fill #0

## 2023-12-08 MED ORDER — FREESTYLE LIBRE 2 SENSOR MISC
1.0000 [IU] | Freq: Every day | 3 refills | Status: DC
  Filled 2023-12-08 – 2023-12-29 (×2): qty 6, 84d supply, fill #0
  Filled 2024-01-16 (×2): qty 6, 60d supply, fill #0

## 2023-12-08 MED ORDER — DAPAGLIFLOZIN PROPANEDIOL 10 MG PO TABS
10.0000 mg | ORAL_TABLET | Freq: Every day | ORAL | 1 refills | Status: DC
  Filled 2023-12-08 – 2024-02-21 (×4): qty 90, 90d supply, fill #0
  Filled 2024-08-07: qty 90, 90d supply, fill #1

## 2023-12-08 NOTE — Progress Notes (Signed)
-  Discussed Qutenza as an option for neuropathic pain control. Discussed that this is a capsaicin patch, stronger than capsaicin cream. Discussed that it is currently approved for diabetic peripheral neuropathy and post-herpetic neuralgia, but that it has also shown benefit in treating other forms of neuropathy. Provided patient with link to site to learn more about the patch: https://www.clark.biz/. Discussed that the patch would be placed in office and benefits usually last 3 months. Discussed that unintended exposure to capsaicin can cause severe irritation of eyes, mucous membranes, respiratory tract, and skin, but that Qutenza is a local treatment and does not have the systemic side effects of other nerve medications. Discussed that there may be pain, itching, erythema, and decreased sensory function associated with the application of Qutenza. Side effects usually subside within 1 week. A cold pack of analgesic medications can help with these side effects. Blood pressure can also be increased due to pain associated with administration of the patch.   4 patches of Qutenza [phone]) was applied to bilateral feet. Ice packs were applied during the procedure to ensure patient comfort. Blood pressure was monitored every 15 minutes. The patient tolerated the procedure well. Post-procedure instructions were given and follow-up has been scheduled.  Topical system measures 14cm x20cm (280cm for a total 1120units) were applied which will cause deeper penetration for destruction of the peripheral nerve using a chemical (Qutenza) which infuses into the skin like an injection and heat technique (occlusive, compressive dressing cauing endothermic heat technique)

## 2023-12-08 NOTE — Telephone Encounter (Signed)
Requested Prescriptions  Pending Prescriptions Disp Refills   pantoprazole (PROTONIX) 20 MG tablet 30 tablet 0    Sig: Take 1 tablet (20 mg total) by mouth daily.     Gastroenterology: Proton Pump Inhibitors Passed - 12/08/2023 11:19 AM      Passed - Valid encounter within last 12 months    Recent Outpatient Visits           2 months ago Essential hypertension   [place] [facility] Margarita Mail, DO   5 months ago Type 2 diabetes mellitus with diabetic polyneuropathy, without long-term current use of insulin [facility])   [place] [facility] Margarita Mail, DO   8 months ago Type 2 diabetes mellitus with diabetic polyneuropathy, without long-term current use of insulin [facility])   [place] [facility] Margarita Mail, DO   11 months ago Type 2 diabetes mellitus with diabetic polyneuropathy, without long-term current use of insulin [facility])   [place] [facility] Margarita Mail, DO   3 years ago Routine general medical examination at a health care facility   [facility] Dorcas Carrow, DO       Future Appointments             In 1 week Margarita Mail, DO [facility], [facility]

## 2023-12-08 NOTE — Addendum Note (Signed)
Addended by: Horton Chin on: 12/08/2023 10:19 AM   Modules accepted: Orders

## 2023-12-13 ENCOUNTER — Other Ambulatory Visit (HOSPITAL_COMMUNITY): Payer: Self-pay

## 2023-12-15 ENCOUNTER — Other Ambulatory Visit: Payer: Self-pay

## 2023-12-15 ENCOUNTER — Ambulatory Visit (INDEPENDENT_AMBULATORY_CARE_PROVIDER_SITE_OTHER): Payer: PPO | Admitting: Internal Medicine

## 2023-12-15 ENCOUNTER — Other Ambulatory Visit (HOSPITAL_COMMUNITY): Payer: Self-pay

## 2023-12-15 VITALS — BP 130/70 | HR 74 | Temp 98.1°F | Resp 16 | Ht 74.0 in | Wt 242.1 lb

## 2023-12-15 DIAGNOSIS — I10 Essential (primary) hypertension: Secondary | ICD-10-CM | POA: Diagnosis not present

## 2023-12-15 DIAGNOSIS — E1142 Type 2 diabetes mellitus with diabetic polyneuropathy: Secondary | ICD-10-CM | POA: Diagnosis not present

## 2023-12-15 DIAGNOSIS — E785 Hyperlipidemia, unspecified: Secondary | ICD-10-CM | POA: Diagnosis not present

## 2023-12-15 DIAGNOSIS — R112 Nausea with vomiting, unspecified: Secondary | ICD-10-CM

## 2023-12-15 DIAGNOSIS — E559 Vitamin D deficiency, unspecified: Secondary | ICD-10-CM | POA: Diagnosis not present

## 2023-12-15 DIAGNOSIS — E1169 Type 2 diabetes mellitus with other specified complication: Secondary | ICD-10-CM | POA: Diagnosis not present

## 2023-12-15 DIAGNOSIS — Z125 Encounter for screening for malignant neoplasm of prostate: Secondary | ICD-10-CM

## 2023-12-15 DIAGNOSIS — Z7984 Long term (current) use of oral hypoglycemic drugs: Secondary | ICD-10-CM | POA: Diagnosis not present

## 2023-12-15 MED ORDER — REPATHA SURECLICK 140 MG/ML ~~LOC~~ SOAJ
140.0000 mg | SUBCUTANEOUS | 3 refills | Status: DC
  Filled 2023-12-15: qty 2, 28d supply, fill #0
  Filled 2024-01-16: qty 6, 84d supply, fill #0

## 2023-12-15 NOTE — Assessment & Plan Note (Signed)
Stable, not on medications. Labs due. Continue to monitor.

## 2023-12-15 NOTE — Assessment & Plan Note (Signed)
Recheck A1c. Continue Farxiga and Metformin, most likely will continue same dose of Ozempic but will wait to refill until after labs result. Microalbumin due.

## 2023-12-15 NOTE — Progress Notes (Signed)
Established Patient Office Visit  Subjective   Patient ID: Casey Marso., male    DOB: [birth year]  Age: 68 y.o. MRN: [id number]  Chief Complaint  Patient presents with   Medical Management of Chronic Issues    3 month recheck    HPI  Casey Steger. presents to follow up on chronic medical conditions. Following with Dr. Carlis Abbott PMR in [place].   Patient had been having an ongoing issue of vomiting after eating. We first discussed this over the summer but it had been happening a few times a week for years. This has recently improved, as he discovered that eating breakfast at a local restaurant seems to be the trigger. He stopped eating there for the most part and has been better. He did go once last week, ate eggs and country ham and promptly vomited. This has not occurred since.  History of HTN: had been on 5 different medications but since he had COVID his blood pressure has been much better controlled.   HLD: -Medications: Repatha 140 mg every 14 days, aspirin - statin intolerance.  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  -Patient is compliant with above medications and reports no side effects.  -Last lipid panel: Lipid Panel     Component Value Date/Time   CHOL 264 (H) 12/02/2022 1149   CHOL 150 04/24/2018 0820   CHOL 166 11/22/2012 0903   TRIG 182 (H) 12/02/2022 1149   TRIG 174 (H) 04/24/2018 0820   TRIG 97 11/22/2012 0903   HDL 48 12/02/2022 1149   HDL 40 11/22/2012 0903   CHOLHDL 5.5 (H) 12/02/2022 1149   CHOLHDL 4 04/24/2021 0945   VLDL 29.2 04/24/2021 0945   VLDL 35 (H) 04/24/2018 0820   VLDL 19 11/22/2012 0903   LDLCALC 182 (H) 12/02/2022 1149   LDLCALC 107 (H) 11/22/2012 0903   LABVLDL 34 12/02/2022 1149   Diabetes, Type 2 with Neuropathy: -Last A1c 10/24 7.6% -Medications: Ozempic 1 mg which he is tolerating well, Farxiga 10 mg, Metformin 1000 mg  twice daily. Also on Lyrica 75 mg 4 times a day, Nifedipine 30 mg for numbness and tingling. PMR does Capsaicin patches for him as well.  -Patient is compliant with the above medications and reports no side effects.  -Eye exam: Last one 12/23, due, will call to schedule -Foot exam: UTD 4/24 -Microalbumin: Due today -Statin: Statin intolerance - on Repatha -PNA vaccine: UTD  -Denies symptoms of hypoglycemia, polyuria, polydipsia, numbness extremities, foot ulcers/trauma.   BPH: -Currently on Flomax 0.8 mg, symptoms fairly well controlled   Vitamin D Deficiency: -Currently on 50,000 IU weekly - has been on this dose about 1 year -Last Vitamin D level 43 on 12/02/22  Health Maintenance: -Blood work due -Colonoscopy 2011, due - had one in December but was not completely cleaned out, patient will call and reschedule in the next few months   Patient Active Problem List   Diagnosis Date Noted   Type 2 diabetes mellitus with diabetic polyneuropathy, without long-term current use of insulin (HCC) 12/15/2023   Encounter for screening colonoscopy 10/31/2023   B12 deficiency 04/24/2021   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   Diabetic neuropathy (HCC) 04/25/2020   Fatigue 04/25/2020   Pain in joint, multiple sites 04/25/2020   Physical deconditioning 04/25/2020   DOE (dyspnea on exertion) 01/30/2020   Diabetes mellitus type 2  with complications (HCC) 03/14/2017   Knee osteoarthritis 04/14/2016   Psoriasis 04/14/2016   Sleep apnea 09/16/2015   Essential hypertension 09/16/2015   Hyperlipidemia associated with type 2 diabetes mellitus 09/16/2015   Past Medical History:  Diagnosis Date   Diabetic peripheral neuropathy associated with type 2 diabetes mellitus 03/14/2017   DOE (dyspnea on exertion) 11/2019   Onset jan 2021 with covid 19  - assoc with atypical cp developed during the infection present mostly sitting/ absent supine/ better with deep breathing/ no worse with ex -  01/30/2020   Walked RA x two laps =  approx 575ft @ brisk pace - stopped due to end of study, no sob/ no change cp  with sats of 93 % at the end of the study and no acute ekg changes  - Echo  02/13/2020  wnl with  no pericadial ef   Fatigue 11/2019   History of COVID-19 11/2019   Hyperlipidemia associated with type 2 diabetes mellitus 09/16/2015   Hypertension associated with type 2 diabetes mellitus 09/16/2015   Knee osteoarthritis 04/14/2016   Mild neurocognitive disorder 06/12/2020   Numbness of lower extremity 11/2019   Attributed to COVID-19   Sleep apnea    Patient denied being diagnosed with OSA in the past   Past Surgical History:  Procedure Laterality Date   COLONOSCOPY WITH PROPOFOL N/A 10/31/2023   Procedure: COLONOSCOPY WITH PROPOFOL;  Surgeon: Wyline Mood, MD;  Location: [facility];  Service: Gastroenterology;  Laterality: N/A;   HERNIA REPAIR     Social History   Tobacco Use   Smoking status: Never   Smokeless tobacco: Never  Vaping Use   Vaping status: Never Used  Substance Use Topics   Alcohol use: No    Alcohol/week: 0.0 standard drinks of alcohol   Drug use: No   Social History   Socioeconomic History   Marital status: Divorced    Spouse name: Not on file   Number of children: 2   Years of education: 12   Highest education level: High school graduate  Occupational History   Not on file  Tobacco Use   Smoking status: Never   Smokeless tobacco: Never  Vaping Use   Vaping status: Never Used  Substance and Sexual Activity   Alcohol use: No    Alcohol/week: 0.0 standard drinks of alcohol   Drug use: No   Sexual activity: Not Currently  Other Topics Concern   Not on file  Social History Narrative   Lives alone   Social Drivers of Health   Financial Resource Strain: Low Risk  (05/06/2023)   Overall Financial Resource Strain (CARDIA)    Difficulty of Paying Living Expenses: Not hard at all  Food Insecurity: Unknown (05/06/2023)   Hunger Vital Sign     Worried About Running Out of Food in the Last Year: Never true    Ran Out of Food in the Last Year: Not on file  Transportation Needs: No Transportation Needs (05/06/2023)   PRAPARE - Administrator, Civil Service (Medical): No    Lack of Transportation (Non-Medical): No  Physical Activity: Sufficiently Active (05/06/2023)   Exercise Vital Sign    Days of Exercise per Week: 5 days    Minutes of Exercise per Session: 60 min  Stress: Stress Concern Present (05/06/2023)   [employer] of Occupational Health - Occupational Stress Questionnaire    Feeling of Stress : Rather much  Social Connections: Moderately Isolated (05/06/2023)   Social Connection and Isolation  Panel [NHANES]    Frequency of Communication with Friends and Family: Twice a week    Frequency of Social Gatherings with Friends and Family: Three times a week    Attends Religious Services: Never    Active Member of Clubs or Organizations: Not on file    Attends Club or Organization Meetings: More than 4 times per year    Marital Status: Divorced  Intimate Partner Violence: Not At Risk (05/06/2023)   Humiliation, Afraid, Rape, and Kick questionnaire    Fear of Current or Ex-Partner: No    Emotionally Abused: No    Physically Abused: No    Sexually Abused: No   Family Status  Relation Name Status   Mother  Alive   Father  Alive   Sister  Alive   Brother  Alive   Son  Alive   MGM  Deceased   MGF  Deceased   PGM  Deceased   PGF  Deceased   Sister  Alive   Son  Alive  No partnership data on file   Family History  Problem Relation Age of Onset   Hypertension Mother    Hyperlipidemia Mother    Hyperlipidemia Father    Allergies  Allergen Reactions   Crestor [Rosuvastatin Calcium]     Myalgias   Elemental Sulfur    Lipitor [Atorvastatin]     Myalgia   Penicillin G Potassium [Penicillin G]       Review of Systems  Gastrointestinal:  Positive for nausea and vomiting. Negative for abdominal  pain.  All other systems reviewed and are negative.     Objective:     BP 130/70 (Cuff Size: Large)   Pulse 74   Temp 98.1 F (36.7 C) (Oral)   Resp 16   Ht 6\' 2"  (1.88 m)   Wt 242 lb 1.6 oz (109.8 kg)   SpO2 98%   BMI 31.08 kg/m  BP Readings from Last 3 Encounters:  12/15/23 130/70  12/08/23 (!) 142/75  10/31/23 136/84   Wt Readings from Last 3 Encounters:  12/15/23 242 lb 1.6 oz (109.8 kg)  12/08/23 240 lb (108.9 kg)  10/31/23 229 lb 12.8 oz (104.2 kg)      Physical Exam Constitutional:      Appearance: Normal appearance.  HENT:     Head: Normocephalic and atraumatic.  Eyes:     Conjunctiva/sclera: Conjunctivae normal.  Cardiovascular:     Rate and Rhythm: Normal rate and regular rhythm.  Pulmonary:     Effort: Pulmonary effort is normal.     Breath sounds: Normal breath sounds.  Skin:    General: Skin is warm and dry.  Neurological:     General: No focal deficit present.     Mental Status: He is alert. Mental status is at baseline.  Psychiatric:        Mood and Affect: Mood normal.        Behavior: Behavior normal.      No results found for any visits on 12/15/23.  Last CBC Lab Results  Component Value Date   WBC 8.4 12/23/2022   HGB 16.4 12/23/2022   HCT 48.6 12/23/2022   MCV 84.5 12/23/2022   MCH 28.5 12/23/2022   RDW 13.1 12/23/2022   PLT 273 12/23/2022   Last metabolic panel Lab Results  Component Value Date   GLUCOSE 243 (H) 12/23/2022   NA 139 12/23/2022   K 4.1 12/23/2022   CL 103 12/23/2022   CO2 21 12/23/2022  BUN 11 12/23/2022   CREATININE 0.79 12/23/2022   EGFR 97 12/23/2022   CALCIUM 9.5 12/23/2022   PROT 8.1 12/23/2022   ALBUMIN 3.9 04/24/2021   LABGLOB 3.3 11/17/2020   AGRATIO 1.3 11/17/2020   BILITOT 0.7 12/23/2022   ALKPHOS 68 04/24/2021   AST 14 12/23/2022   ALT 17 12/23/2022   ANIONGAP 9 11/22/2012   Last lipids Lab Results  Component Value Date   CHOL 264 (H) 12/02/2022   HDL 48 12/02/2022   LDLCALC  182 (H) 12/02/2022   TRIG 182 (H) 12/02/2022   CHOLHDL 5.5 (H) 12/02/2022   Last hemoglobin A1c Lab Results  Component Value Date   HGBA1C 7.6 (A) 09/15/2023   Last thyroid functions Lab Results  Component Value Date   TSH 1.30 04/24/2021   T4TOTAL 7.1 04/24/2020   Last vitamin D Lab Results  Component Value Date   VD25OH 68.8 06/02/2023   Last vitamin B12 and Folate Lab Results  Component Value Date   VITAMINB12 275 04/24/2021   FOLATE 11.2 06/27/2020      The 10-year ASCVD risk score (Arnett DK, et al., 2019) is: 38.4%    Assessment & Plan:  Essential hypertension Assessment & Plan: Stable, not on medications. Labs due. Continue to monitor.   Orders: -     CBC with Differential/Platelet -     COMPLETE METABOLIC PANEL WITH GFR  Hyperlipidemia associated with type 2 diabetes mellitus Assessment & Plan: Recheck lipid panel today and refill Repatha.   Orders: -     Lipid panel -     Repatha SureClick; Inject 140 mg into the skin every 14 (fourteen) days.  Dispense: 6 mL; Refill: 3  Type 2 diabetes mellitus with diabetic polyneuropathy, without long-term current use of insulin (HCC) Assessment & Plan: Recheck A1c. Continue Farxiga and Metformin, most likely will continue same dose of Ozempic but will wait to refill until after labs result. Microalbumin due.   Orders: -     Hemoglobin A1c -     Microalbumin / creatinine urine ratio  Vitamin D deficiency -     VITAMIN D 25 Hydroxy (Vit-D Deficiency, Fractures)  Prostate cancer screening -     PSA  Nausea and vomiting, unspecified vomiting type -     Food Allergy Profile  Check Vitamin D levels and PSA today. Also check food allergy panel as he may be allergic to some kind of food/oil being used at this particular restaurant causing his symptoms.    Return in about 3 months (around 03/14/2024).    Margarita Mail, DO

## 2023-12-15 NOTE — Assessment & Plan Note (Signed)
Recheck lipid panel today and refill Repatha.

## 2023-12-16 LAB — CBC WITH DIFFERENTIAL/PLATELET
Absolute Lymphocytes: 2439 {cells}/uL (ref 850–3900)
Absolute Monocytes: 594 {cells}/uL (ref 200–950)
Basophils Absolute: 90 {cells}/uL (ref 0–200)
Basophils Relative: 1 %
Eosinophils Absolute: 207 {cells}/uL (ref 15–500)
Eosinophils Relative: 2.3 %
HCT: 48.1 % (ref 38.5–50.0)
Hemoglobin: 15.8 g/dL (ref 13.2–17.1)
MCH: 28.1 pg (ref 27.0–33.0)
MCHC: 32.8 g/dL (ref 32.0–36.0)
MCV: 85.6 fL (ref 80.0–100.0)
MPV: 11.6 fL (ref 7.5–12.5)
Monocytes Relative: 6.6 %
Neutro Abs: 5670 {cells}/uL (ref 1500–7800)
Neutrophils Relative %: 63 %
Platelets: 287 10*3/uL (ref 140–400)
RBC: 5.62 10*6/uL (ref 4.20–5.80)
RDW: 12.8 % (ref 11.0–15.0)
Total Lymphocyte: 27.1 %
WBC: 9 10*3/uL (ref 3.8–10.8)

## 2023-12-16 LAB — VITAMIN D 25 HYDROXY (VIT D DEFICIENCY, FRACTURES): Vit D, 25-Hydroxy: 82 ng/mL (ref 30–100)

## 2023-12-16 LAB — LIPID PANEL
Cholesterol: 227 mg/dL — ABNORMAL HIGH (ref <200)
HDL: 53 mg/dL (ref >=40)
LDL Cholesterol (Calc): 133 mg/dL — ABNORMAL HIGH
Non-HDL Cholesterol (Calc): 174 mg/dL — ABNORMAL HIGH (ref <130)
Total CHOL/HDL Ratio: 4.3 (calc) (ref <5.0)
Triglycerides: 264 mg/dL — ABNORMAL HIGH (ref <150)

## 2023-12-16 LAB — COMPLETE METABOLIC PANEL WITH GFR
AG Ratio: 1.1 (calc) (ref 1.0–2.5)
ALT: 12 U/L (ref 9–46)
AST: 12 U/L (ref 10–35)
Albumin: 4.2 g/dL (ref 3.6–5.1)
Alkaline phosphatase (APISO): 108 U/L (ref 35–144)
BUN: 15 mg/dL (ref 7–25)
CO2: 25 mmol/L (ref 20–32)
Calcium: 9.4 mg/dL (ref 8.6–10.3)
Chloride: 101 mmol/L (ref 98–110)
Creat: 0.92 mg/dL (ref 0.70–1.35)
Globulin: 3.9 g/dL — ABNORMAL HIGH (ref 1.9–3.7)
Glucose, Bld: 167 mg/dL — ABNORMAL HIGH (ref 65–99)
Potassium: 3.8 mmol/L (ref 3.5–5.3)
Sodium: 137 mmol/L (ref 135–146)
Total Bilirubin: 0.5 mg/dL (ref 0.2–1.2)
Total Protein: 8.1 g/dL (ref 6.1–8.1)
eGFR: 91 mL/min/{1.73_m2} (ref >=60)

## 2023-12-16 LAB — HEMOGLOBIN A1C
Hgb A1c MFr Bld: 7.8 %{Hb} — ABNORMAL HIGH (ref <5.7)
Mean Plasma Glucose: 177 mg/dL
eAG (mmol/L): 9.8 mmol/L

## 2023-12-16 LAB — PSA: PSA: 1.99 ng/mL (ref <=4.00)

## 2023-12-16 LAB — MICROALBUMIN / CREATININE URINE RATIO
Creatinine, Urine: 15 mg/dL — ABNORMAL LOW (ref 20–320)
Microalb Creat Ratio: 27 mg/g{creat} (ref <30)
Microalb, Ur: 0.4 mg/dL

## 2023-12-20 ENCOUNTER — Other Ambulatory Visit: Payer: Self-pay | Admitting: Internal Medicine

## 2023-12-20 ENCOUNTER — Other Ambulatory Visit: Payer: Self-pay

## 2023-12-20 DIAGNOSIS — E1142 Type 2 diabetes mellitus with diabetic polyneuropathy: Secondary | ICD-10-CM

## 2023-12-20 MED ORDER — SEMAGLUTIDE (2 MG/DOSE) 8 MG/3ML ~~LOC~~ SOPN
2.0000 mg | PEN_INJECTOR | SUBCUTANEOUS | 3 refills | Status: DC
  Filled 2023-12-20 – 2024-01-25 (×4): qty 3, 28d supply, fill #0
  Filled 2024-02-21: qty 3, 28d supply, fill #1

## 2023-12-29 ENCOUNTER — Other Ambulatory Visit: Payer: Self-pay

## 2023-12-29 ENCOUNTER — Other Ambulatory Visit (HOSPITAL_COMMUNITY): Payer: Self-pay

## 2023-12-30 ENCOUNTER — Other Ambulatory Visit (HOSPITAL_COMMUNITY): Payer: Self-pay

## 2024-01-03 ENCOUNTER — Other Ambulatory Visit: Payer: Self-pay

## 2024-01-03 ENCOUNTER — Other Ambulatory Visit (HOSPITAL_COMMUNITY): Payer: Self-pay

## 2024-01-08 DIAGNOSIS — G629 Polyneuropathy, unspecified: Secondary | ICD-10-CM | POA: Diagnosis not present

## 2024-01-16 ENCOUNTER — Other Ambulatory Visit: Payer: Self-pay | Admitting: Internal Medicine

## 2024-01-16 ENCOUNTER — Other Ambulatory Visit (HOSPITAL_COMMUNITY): Payer: Self-pay

## 2024-01-16 ENCOUNTER — Other Ambulatory Visit: Payer: Self-pay

## 2024-01-16 DIAGNOSIS — E1142 Type 2 diabetes mellitus with diabetic polyneuropathy: Secondary | ICD-10-CM

## 2024-01-17 ENCOUNTER — Other Ambulatory Visit: Payer: Self-pay

## 2024-01-17 NOTE — Telephone Encounter (Signed)
 Requested Prescriptions  Refused Prescriptions Disp Refills   OZEMPIC, 0.25 OR 0.5 MG/DOSE, 2 MG/3ML SOPN [Pharmacy Med Name: Semaglutide,0.25 or 0.5MG /DOS, (OZEMPIC, 0.25 OR 0.5 MG/DOSE,) 2 MG/3ML Solution Pen-injector] 9 mL 0    Sig: Inject 0.5 mg into the skin once a week.     Endocrinology:  Diabetes - GLP-1 Receptor Agonists - semaglutide Failed - 01/17/2024  4:42 PM      Failed - HBA1C in normal range and within 180 days    HB A1C (BAYER DCA - WAIVED)  Date Value Ref Range Status  11/17/2020 9.2 (H) <7.0 % Final    Comment:                                          Diabetic Adult            <7.0                                       Healthy Adult        4.3 - 5.7                                                           (DCCT/NGSP) American Diabetes Association's Summary of Glycemic Recommendations for Adults with Diabetes: Hemoglobin A1c <7.0%. More stringent glycemic goals (A1c <6.0%) may further reduce complications at the cost of increased risk of hypoglycemia.    Hgb A1c MFr Bld  Date Value Ref Range Status  12/15/2023 7.8 (H) <5.7 % of total Hgb Final    Comment:    For someone without known diabetes, a hemoglobin A1c value of 6.5% or greater indicates that they may have  diabetes and this should be confirmed with a follow-up  test. . For someone with known diabetes, a value <7% indicates  that their diabetes is well controlled and a value  greater than or equal to 7% indicates suboptimal  control. A1c targets should be individualized based on  duration of diabetes, age, comorbid conditions, and  other considerations. . Currently, no consensus exists regarding use of hemoglobin A1c for diagnosis of diabetes for children. .          Passed - Cr in normal range and within 360 days    Creat  Date Value Ref Range Status  12/15/2023 0.92 0.70 - 1.35 mg/dL Final   Creatinine, Urine  Date Value Ref Range Status  12/15/2023 15 (L) 20 - 320 mg/dL Final          Passed - Valid encounter within last 6 months    Recent Outpatient Visits           1 month ago Essential hypertension   [facility] Margarita Mail, DO   4 months ago Essential hypertension   [facility] Margarita Mail, DO   7 months ago Type 2 diabetes mellitus with diabetic polyneuropathy, without long-term current use of insulin [facility])   [place] [facility] Margarita Mail, DO   10 months ago Type 2 diabetes mellitus with diabetic polyneuropathy, without long-term current use of insulin (HCC)  [facility] Margarita Mail, DO   1 year ago Type 2 diabetes mellitus with diabetic polyneuropathy, without long-term current use of insulin [facility])   [place] [facility] Margarita Mail, DO       Future Appointments             In 1 month Margarita Mail, DO [facility], [facility]

## 2024-01-18 ENCOUNTER — Other Ambulatory Visit: Payer: Self-pay

## 2024-01-25 ENCOUNTER — Other Ambulatory Visit: Payer: Self-pay

## 2024-01-27 ENCOUNTER — Other Ambulatory Visit: Payer: Self-pay

## 2024-01-30 ENCOUNTER — Encounter: Admitting: Physical Medicine and Rehabilitation

## 2024-01-31 ENCOUNTER — Encounter: Admitting: Physical Medicine and Rehabilitation

## 2024-02-05 DIAGNOSIS — G629 Polyneuropathy, unspecified: Secondary | ICD-10-CM | POA: Diagnosis not present

## 2024-02-21 ENCOUNTER — Other Ambulatory Visit: Payer: Self-pay

## 2024-02-21 ENCOUNTER — Telehealth: Payer: Self-pay | Admitting: Physical Medicine and Rehabilitation

## 2024-02-21 NOTE — Telephone Encounter (Signed)
 Christy with [facility] called to get patients Lyrica transferred to them instead of [facility].  Please call her @ [phone].

## 2024-02-24 ENCOUNTER — Other Ambulatory Visit: Payer: Self-pay

## 2024-02-24 ENCOUNTER — Other Ambulatory Visit (HOSPITAL_COMMUNITY): Payer: Self-pay

## 2024-02-24 ENCOUNTER — Other Ambulatory Visit: Payer: Self-pay | Admitting: Physical Medicine and Rehabilitation

## 2024-02-24 MED ORDER — PREGABALIN 75 MG PO CAPS
75.0000 mg | ORAL_CAPSULE | Freq: Four times a day (QID) | ORAL | 3 refills | Status: DC
  Filled 2024-02-24: qty 270, 90d supply, fill #0

## 2024-03-01 ENCOUNTER — Other Ambulatory Visit: Payer: Self-pay | Admitting: Physical Medicine and Rehabilitation

## 2024-03-01 DIAGNOSIS — E1142 Type 2 diabetes mellitus with diabetic polyneuropathy: Secondary | ICD-10-CM

## 2024-03-06 ENCOUNTER — Telehealth: Payer: Self-pay

## 2024-03-06 MED ORDER — QUTENZA (4 PATCH) 8 % EX KIT: 4.0000 | PACK | Freq: Once | CUTANEOUS | 0 refills | Status: DC

## 2024-03-06 NOTE — Telephone Encounter (Signed)
 Refill for Qutenza patches sent to [facility]. 4 patches

## 2024-03-07 DIAGNOSIS — G629 Polyneuropathy, unspecified: Secondary | ICD-10-CM | POA: Diagnosis not present

## 2024-03-08 ENCOUNTER — Encounter: Payer: Self-pay | Admitting: Physical Medicine and Rehabilitation

## 2024-03-08 ENCOUNTER — Encounter: Payer: PPO | Attending: Physical Medicine and Rehabilitation | Admitting: Physical Medicine and Rehabilitation

## 2024-03-08 VITALS — BP 120/80 | HR 77 | Ht 74.0 in | Wt 230.0 lb

## 2024-03-08 DIAGNOSIS — G629 Polyneuropathy, unspecified: Secondary | ICD-10-CM | POA: Diagnosis not present

## 2024-03-08 MED ORDER — CAPSAICIN-CLEANSING GEL 8 % EX KIT
4.0000 | PACK | Freq: Once | CUTANEOUS | Status: AC
  Administered 2024-03-08: 4 via TOPICAL

## 2024-03-08 NOTE — Progress Notes (Signed)
-  Discussed Qutenza as an option for neuropathic pain control. Discussed that this is a capsaicin patch, stronger than capsaicin cream. Discussed that it is currently approved for diabetic peripheral neuropathy and post-herpetic neuralgia, but that it has also shown benefit in treating other forms of neuropathy. Provided patient with link to site to learn more about the patch: https://www.clark.biz/. Discussed that the patch would be placed in office and benefits usually last 3 months. Discussed that unintended exposure to capsaicin can cause severe irritation of eyes, mucous membranes, respiratory tract, and skin, but that Qutenza is a local treatment and does not have the systemic side effects of other nerve medications. Discussed that there may be pain, itching, erythema, and decreased sensory function associated with the application of Qutenza. Side effects usually subside within 1 week. A cold pack of analgesic medications can help with these side effects. Blood pressure can also be increased due to pain associated with administration of the patch.   4 patches of Qutenza [id number]) was applied to the area of pain. Ice packs were applied during the procedure to ensure patient comfort. Blood pressure was monitored every 15 minutes. The patient tolerated the procedure well. Post-procedure instructions were given and follow-up has been scheduled.  Topical system measures 14cm x20cm (280cm for a total 1120units) were applied which will cause deeper penetration for destruction of the peripheral nerve using a chemical (Qutenza) which infuses into the skin like an injection and heat technique (occlusive, compressive dressing cauing endothermic heat technique)

## 2024-03-14 ENCOUNTER — Encounter: Payer: Self-pay | Admitting: Internal Medicine

## 2024-03-14 ENCOUNTER — Telehealth: Payer: Self-pay | Admitting: Internal Medicine

## 2024-03-14 ENCOUNTER — Telehealth: Payer: Self-pay

## 2024-03-14 ENCOUNTER — Other Ambulatory Visit: Payer: Self-pay

## 2024-03-14 ENCOUNTER — Ambulatory Visit: Payer: Self-pay | Admitting: Internal Medicine

## 2024-03-14 VITALS — BP 134/88 | HR 82 | Temp 97.5°F | Resp 18 | Ht 74.0 in | Wt 232.6 lb

## 2024-03-14 DIAGNOSIS — E1169 Type 2 diabetes mellitus with other specified complication: Secondary | ICD-10-CM

## 2024-03-14 DIAGNOSIS — L409 Psoriasis, unspecified: Secondary | ICD-10-CM

## 2024-03-14 DIAGNOSIS — D229 Melanocytic nevi, unspecified: Secondary | ICD-10-CM | POA: Diagnosis not present

## 2024-03-14 DIAGNOSIS — E1142 Type 2 diabetes mellitus with diabetic polyneuropathy: Secondary | ICD-10-CM

## 2024-03-14 DIAGNOSIS — E785 Hyperlipidemia, unspecified: Secondary | ICD-10-CM | POA: Diagnosis not present

## 2024-03-14 LAB — POCT GLYCOSYLATED HEMOGLOBIN (HGB A1C): Hemoglobin A1C: 6.6 % — AB (ref 4.0–5.6)

## 2024-03-14 MED ORDER — REPATHA SURECLICK 140 MG/ML ~~LOC~~ SOAJ
140.0000 mg | SUBCUTANEOUS | 3 refills | Status: AC
  Filled 2024-03-14: qty 2, 28d supply, fill #0
  Filled 2024-03-14: qty 6, 84d supply, fill #0
  Filled 2024-03-14 (×2): qty 2, 28d supply, fill #0
  Filled 2024-03-14: qty 6, 84d supply, fill #0

## 2024-03-14 MED ORDER — SEMAGLUTIDE (2 MG/DOSE) 8 MG/3ML ~~LOC~~ SOPN
2.0000 mg | PEN_INJECTOR | SUBCUTANEOUS | 1 refills | Status: DC
  Filled 2024-03-14: qty 6, 56d supply, fill #0
  Filled 2024-05-18 – 2024-05-21 (×2): qty 6, 56d supply, fill #1

## 2024-03-14 NOTE — Progress Notes (Signed)
 Established Patient Office Visit  Subjective   Patient ID: Casey Tillman., male    DOB: [birth year]  Age: 68 y.o. MRN: [id number]  Chief Complaint  Patient presents with   Medical Management of Chronic Issues    HPI  Casey Reinard. presents to follow up on chronic medical conditions. Following with Dr. Alessandra Ancona PMR in [place].    History of HTN: had been on 5 different medications but since he had COVID his blood pressure has been much better controlled.   HLD: -Medications: had been on Repatha  140 mg every 14 days due to statin intolerance but today states he hasn't been taking it for the last several months - no real reason just doesn't want to be on medication in general. 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  -Patient is compliant with above medications and reports no side effects.  -Last lipid panel: Lipid Panel     Component Value Date/Time   CHOL 227 (H) 12/15/2023 1344   CHOL 264 (H) 12/02/2022 1149   CHOL 150 04/24/2018 0820   CHOL 166 11/22/2012 0903   TRIG 264 (H) 12/15/2023 1344   TRIG 174 (H) 04/24/2018 0820   TRIG 97 11/22/2012 0903   HDL 53 12/15/2023 1344   HDL 48 12/02/2022 1149   HDL 40 11/22/2012 0903   CHOLHDL 4.3 12/15/2023 1344   VLDL 29.2 04/24/2021 0945   VLDL 35 (H) 04/24/2018 0820   VLDL 19 11/22/2012 0903   LDLCALC 133 (H) 12/15/2023 1344   LDLCALC 107 (H) 11/22/2012 0903   LABVLDL 34 12/02/2022 1149   The 10-year ASCVD risk score (Arnett DK, et al., 2019) is: 35.8%   Values used to calculate the score:     Age: 19 years     Sex: Male     Is Non-Hispanic African American: No     Diabetic: Yes     Tobacco smoker: No     Systolic Blood Pressure: 134 mmHg     Is BP treated: Yes     HDL Cholesterol: 53 mg/dL     Total Cholesterol: 227 mg/dL  Diabetes, Type 2 with Neuropathy: -Last A1c 1/25 7.8%   -Medications: Ozempic  increased to 2 mg at LOV which he is tolerating well, Farxiga  10 mg, Metformin  1000 mg twice daily. Also on Lyrica  75 mg 4 times a day, Nifedipine  30 mg for numbness and tingling. PMR does Capsaicin  patches for him as well.  -Patient is compliant with the above medications and reports no side effects.  -Eye exam: Last one 12/23, due, will call to schedule -Foot exam: due today -Microalbumin: UTD -Statin: Statin intolerance -PNA vaccine: UTD  -Denies symptoms of hypoglycemia, polyuria, polydipsia, numbness extremities, foot ulcers/trauma.   BPH: -Currently on Flomax  0.8 mg, symptoms fairly well controlled   Vitamin D  Deficiency: -Currently on 50,000 IU weekly - has been on this dose about 1 year -Last Vitamin D  level 43 on 12/02/22  Health Maintenance: -Blood work UTD -Colonoscopy 2011, due - had one in December but was not completely cleaned out, patient will call and reschedule in the next few months   Patient Active Problem List   Diagnosis Date Noted   Type 2 diabetes mellitus with diabetic polyneuropathy, without long-term current use of insulin (HCC) 12/15/2023   Encounter for screening colonoscopy 10/31/2023   B12 deficiency 04/24/2021  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   Diabetic neuropathy (HCC) 04/25/2020   Fatigue 04/25/2020   Pain in joint, multiple sites 04/25/2020   Physical deconditioning 04/25/2020   DOE (dyspnea on exertion) 01/30/2020   Diabetes mellitus type 2 with complications (HCC) 03/14/2017   Knee osteoarthritis 04/14/2016   Psoriasis 04/14/2016   Sleep apnea 09/16/2015   Essential hypertension 09/16/2015   Hyperlipidemia associated with type 2 diabetes mellitus 09/16/2015   Past Medical History:  Diagnosis Date   Diabetic peripheral neuropathy associated with type 2 diabetes mellitus 03/14/2017   DOE (dyspnea on exertion) 11/2019   Onset jan 2021 with covid 19  - assoc with atypical cp developed during the  infection present mostly sitting/ absent supine/ better with deep breathing/ no worse with ex - 01/30/2020   Walked RA x two laps =  approx 552ft @ brisk pace - stopped due to end of study, no sob/ no change cp  with sats of 93 % at the end of the study and no acute ekg changes  - Echo  02/13/2020  wnl with  no pericadial ef   Fatigue 11/2019   History of COVID-19 11/2019   Hyperlipidemia associated with type 2 diabetes mellitus 09/16/2015   Hypertension associated with type 2 diabetes mellitus 09/16/2015   Knee osteoarthritis 04/14/2016   Mild neurocognitive disorder 06/12/2020   Numbness of lower extremity 11/2019   Attributed to COVID-19   Sleep apnea    Patient denied being diagnosed with OSA in the past   Past Surgical History:  Procedure Laterality Date   COLONOSCOPY WITH PROPOFOL  N/A 10/31/2023   Procedure: COLONOSCOPY WITH PROPOFOL ;  Surgeon: Luke Salaam, MD;  Location: [facility];  Service: Gastroenterology;  Laterality: N/A;   HERNIA REPAIR     Social History   Tobacco Use   Smoking status: Never   Smokeless tobacco: Never  Vaping Use   Vaping status: Never Used  Substance Use Topics   Alcohol use: No    Alcohol/week: 0.0 standard drinks of alcohol   Drug use: No   Social History   Socioeconomic History   Marital status: Divorced    Spouse name: Not on file   Number of children: 2   Years of education: 12   Highest education level: High school graduate  Occupational History   Not on file  Tobacco Use   Smoking status: Never   Smokeless tobacco: Never  Vaping Use   Vaping status: Never Used  Substance and Sexual Activity   Alcohol use: No    Alcohol/week: 0.0 standard drinks of alcohol   Drug use: No   Sexual activity: Not Currently  Other Topics Concern   Not on file  Social History Narrative   Lives alone   Social Drivers of Health   Financial Resource Strain: Low Risk  (05/06/2023)   Overall Financial Resource Strain (CARDIA)    Difficulty of  Paying Living Expenses: Not hard at all  Food Insecurity: Unknown (05/06/2023)   Hunger Vital Sign    Worried About Running Out of Food in the Last Year: Never true    Ran Out of Food in the Last Year: Not on file  Transportation Needs: No Transportation Needs (05/06/2023)   PRAPARE - Administrator, Civil Service (Medical): No    Lack of Transportation (Non-Medical): No  Physical Activity: Sufficiently Active (05/06/2023)   Exercise Vital Sign    Days of Exercise per Week: 5 days    Minutes of Exercise per  Session: 60 min  Stress: Stress Concern Present (05/06/2023)   [employer] of Occupational Health - Occupational Stress Questionnaire    Feeling of Stress : Rather much  Social Connections: Moderately Isolated (05/06/2023)   Social Connection and Isolation Panel [NHANES]    Frequency of Communication with Friends and Family: Twice a week    Frequency of Social Gatherings with Friends and Family: Three times a week    Attends Religious Services: Never    Active Member of Clubs or Organizations: Not on file    Attends Club or Organization Meetings: More than 4 times per year    Marital Status: Divorced  Intimate Partner Violence: Not At Risk (05/06/2023)   Humiliation, Afraid, Rape, and Kick questionnaire    Fear of Current or Ex-Partner: No    Emotionally Abused: No    Physically Abused: No    Sexually Abused: No   Family Status  Relation Name Status   Mother  Alive   Father  Alive   Sister  Alive   Brother  Alive   Son  Alive   MGM  Deceased   MGF  Deceased   PGM  Deceased   PGF  Deceased   Sister  Alive   Son  Alive  No partnership data on file   Family History  Problem Relation Age of Onset   Hypertension Mother    Hyperlipidemia Mother    Hyperlipidemia Father    Allergies  Allergen Reactions   Crestor [Rosuvastatin Calcium ]     Myalgias   Elemental Sulfur    Lipitor [Atorvastatin]     Myalgia   Penicillin G Potassium [Penicillin G]        Review of Systems  All other systems reviewed and are negative.     Objective:     BP 134/88   Pulse 82   Temp (!) 97.5 F (36.4 C)   Resp 18   Ht 6\' 2"  (1.88 m)   Wt 232 lb 9.6 oz (105.5 kg)   SpO2 96%   BMI 29.86 kg/m  BP Readings from Last 3 Encounters:  03/14/24 134/88  03/08/24 120/80  12/15/23 130/70   Wt Readings from Last 3 Encounters:  03/14/24 232 lb 9.6 oz (105.5 kg)  03/08/24 230 lb (104.3 kg)  12/15/23 242 lb 1.6 oz (109.8 kg)      Physical Exam Constitutional:      Appearance: Normal appearance.  HENT:     Head: Normocephalic and atraumatic.  Eyes:     Conjunctiva/sclera: Conjunctivae normal.  Cardiovascular:     Rate and Rhythm: Normal rate and regular rhythm.     Pulses:          Dorsalis pedis pulses are 2+ on the right side and 2+ on the left side.  Pulmonary:     Effort: Pulmonary effort is normal.     Breath sounds: Normal breath sounds.  Musculoskeletal:     Right foot: Normal range of motion. No deformity, bunion, Charcot foot, foot drop or prominent metatarsal heads.     Left foot: Normal range of motion. No deformity, bunion, Charcot foot, foot drop or prominent metatarsal heads.  Feet:     Right foot:     Protective Sensation: 6 sites tested.  6 sites sensed.     Skin integrity: Skin integrity normal.     Toenail Condition: Right toenails are abnormally thick.     Left foot:     Protective Sensation: 6 sites tested.  6  sites sensed.     Skin integrity: Skin integrity normal.     Toenail Condition: Left toenails are abnormally thick.  Skin:    General: Skin is warm and dry.  Neurological:     General: No focal deficit present.     Mental Status: He is alert. Mental status is at baseline.  Psychiatric:        Mood and Affect: Mood normal.        Behavior: Behavior normal.      No results found for any visits on 03/14/24.  Last CBC Lab Results  Component Value Date   WBC 9.0 12/15/2023   HGB 15.8 12/15/2023   HCT  48.1 12/15/2023   MCV 85.6 12/15/2023   MCH 28.1 12/15/2023   RDW 12.8 12/15/2023   PLT 287 12/15/2023   Last metabolic panel Lab Results  Component Value Date   GLUCOSE 167 (H) 12/15/2023   NA 137 12/15/2023   K 3.8 12/15/2023   CL 101 12/15/2023   CO2 25 12/15/2023   BUN 15 12/15/2023   CREATININE 0.92 12/15/2023   EGFR 91 12/15/2023   CALCIUM  9.4 12/15/2023   PROT 8.1 12/15/2023   ALBUMIN 3.9 04/24/2021   LABGLOB 3.3 11/17/2020   AGRATIO 1.3 11/17/2020   BILITOT 0.5 12/15/2023   ALKPHOS 68 04/24/2021   AST 12 12/15/2023   ALT 12 12/15/2023   ANIONGAP 9 11/22/2012   Last lipids Lab Results  Component Value Date   CHOL 227 (H) 12/15/2023   HDL 53 12/15/2023   LDLCALC 133 (H) 12/15/2023   TRIG 264 (H) 12/15/2023   CHOLHDL 4.3 12/15/2023   Last hemoglobin A1c Lab Results  Component Value Date   HGBA1C 7.8 (H) 12/15/2023   Last thyroid  functions Lab Results  Component Value Date   TSH 1.30 04/24/2021   T4TOTAL 7.1 04/24/2020   Last vitamin D  Lab Results  Component Value Date   VD25OH 82 12/15/2023   Last vitamin B12 and Folate Lab Results  Component Value Date   VITAMINB12 275 04/24/2021   FOLATE 11.2 06/27/2020      The 10-year ASCVD risk score (Arnett DK, et al., 2019) is: 35.8%    Assessment & Plan:   Assessment & Plan Type 2 diabetes mellitus Diabetes well-managed with A1c at 6.6%. Ozempic  2 mg effective and tolerated. Insurance supply issues clarified. - Refill Ozempic  2 mg weekly  Hyperlipidemia Discontinued Repatha  over a year ago. Lipid panel shows increased triglycerides and cholesterol. Discussed Repatha 's role in reducing cardiovascular risk with diabetes. ASCVD risk 35%, high. Agreed to restart Repatha . - Restart Repatha , send prescription to [facility] pharmacy.  Actinic keratosis Lesion on back suspected actinic keratosis. Previous dermatology referral unsuccessful. - Message nurse to follow up on dermatology referral. - Consider  referral to [facility] if local appointment unavailable.  - POCT HgB A1C - HM Diabetes Foot Exam - Semaglutide , 2 MG/DOSE, 8 MG/3ML SOPN; Inject 2 mg as directed once a week.  Dispense: 6 mL; Refill: 1 - Evolocumab  (REPATHA  SURECLICK) 140 MG/ML SOAJ; Inject 140 mg into the skin every 14 (fourteen) days.  Dispense: 6 mL; Refill: 3 - Ambulatory referral to Dermatology   Return in about 6 months (around 09/13/2024).    Rockney Cid, DO

## 2024-03-14 NOTE — Telephone Encounter (Signed)
 Can you check on referral, has never heard anything?

## 2024-03-14 NOTE — Telephone Encounter (Signed)
 Evolocumab  (REPATHA  SURECLICK) 140 MG/ML SOAJ   Key: [id number]

## 2024-03-14 NOTE — Telephone Encounter (Signed)
 PA started

## 2024-03-14 NOTE — Telephone Encounter (Signed)
 Dermatology, and she placed another referral today.  Can we make sure this gets taken care of?

## 2024-03-23 ENCOUNTER — Other Ambulatory Visit: Payer: Self-pay

## 2024-04-03 DIAGNOSIS — E113393 Type 2 diabetes mellitus with moderate nonproliferative diabetic retinopathy without macular edema, bilateral: Secondary | ICD-10-CM | POA: Diagnosis not present

## 2024-04-06 DIAGNOSIS — G629 Polyneuropathy, unspecified: Secondary | ICD-10-CM | POA: Diagnosis not present

## 2024-05-07 DIAGNOSIS — G629 Polyneuropathy, unspecified: Secondary | ICD-10-CM | POA: Diagnosis not present

## 2024-05-09 DIAGNOSIS — H02403 Unspecified ptosis of bilateral eyelids: Secondary | ICD-10-CM | POA: Diagnosis not present

## 2024-05-10 ENCOUNTER — Ambulatory Visit: Payer: Self-pay

## 2024-05-10 DIAGNOSIS — Z0000 Encounter for general adult medical examination without abnormal findings: Secondary | ICD-10-CM

## 2024-05-10 NOTE — Progress Notes (Signed)
 Subjective:   Casey Majka. is a 68 y.o. who presents for a Medicare Wellness preventive visit.  As a reminder, Annual Wellness Visits don't include a physical exam, and some assessments may be limited, especially if this visit is performed virtually. We may recommend an in-person follow-up visit with your provider if needed.  Visit Complete: Virtual I connected with  Casey Hunter. on 05/10/24 by a audio enabled telemedicine application and verified that I am speaking with the correct person using two identifiers.  Patient Location: Home  Provider Location: Home Office  I discussed the limitations of evaluation and management by telemedicine. The patient expressed understanding and agreed to proceed.  Vital Signs: Because this visit was a virtual/telehealth visit, some criteria may be missing or patient reported. Any vitals not documented were not able to be obtained and vitals that have been documented are patient reported.  VideoDeclined- This patient declined Librarian, academic. Therefore the visit was completed with audio only.  Persons Participating in Visit: Patient.  AWV Questionnaire: No: Patient Medicare AWV questionnaire was not completed prior to this visit.  Cardiac Risk Factors include: advanced age (>54men, >13 women);diabetes mellitus;hypertension;male gender;dyslipidemia     Objective:    There were no vitals filed for this visit. There is no height or weight on file to calculate BMI.     05/10/2024    8:21 AM 10/31/2023    8:09 AM 08/10/2023    9:37 AM 05/06/2023    8:33 AM  Advanced Directives  Does Patient Have a Medical Advance Directive? No No No No  Would patient like information on creating a medical advance directive? No - Patient declined  No - Patient declined     Current Medications (verified) Outpatient Encounter Medications as of 05/10/2024  Medication Sig   acetaminophen (TYLENOL) 650 MG CR tablet Take 1,300  mg by mouth as needed.   aspirin 81 MG tablet Take 81 mg by mouth daily.   dapagliflozin  propanediol (FARXIGA ) 10 MG TABS tablet Take 1 tablet (10 mg total) by mouth daily.   Evolocumab  (REPATHA  SURECLICK) 140 MG/ML SOAJ Inject 140 mg into the skin every 14 (fourteen) days.   metFORMIN  (GLUCOPHAGE ) 1000 MG tablet Take 1 tablet (1,000 mg total) by mouth 2 (two) times daily with a meal.   NIFEdipine  (PROCARDIA -XL/NIFEDICAL-XL) 30 MG 24 hr tablet Take 1 tablet (30 mg total) by mouth daily.   pregabalin  (LYRICA ) 75 MG capsule Take 1 capsule (75 mg total) by mouth 4 (four) times daily. (Patient taking differently: Take 75 mg by mouth 4 (four) times daily. TAKES ONE IN A.M. & THREE EVERY NIGHT)   Semaglutide , 2 MG/DOSE, 8 MG/3ML SOPN Inject 2 mg as directed once a week.   tamsulosin  (FLOMAX ) 0.4 MG CAPS capsule Take 2 capsules (0.8 mg total) by mouth daily after supper.   meloxicam  (MOBIC ) 15 MG tablet TAKE 1 TABLET BY MOUTH DAILY AS  NEEDED FOR PAIN (Patient not taking: Reported on 05/10/2024)   Vitamin D , Ergocalciferol , (DRISDOL ) 1.25 MG (50000 UNIT) CAPS capsule Take 1 capsule (50,000 Units total) by mouth every 7 (seven) days. (Patient not taking: Reported on 05/10/2024)   No facility-administered encounter medications on file as of 05/10/2024.    Allergies (verified) Crestor [rosuvastatin calcium ], Elemental sulfur, Lipitor [atorvastatin], and Penicillin g potassium [penicillin g]   History: Past Medical History:  Diagnosis Date   Diabetic peripheral neuropathy associated with type 2 diabetes mellitus 03/14/2017   DOE (dyspnea on exertion)  11/2019   Onset jan 2021 with covid 19  - assoc with atypical cp developed during the infection present mostly sitting/ absent supine/ better with deep breathing/ no worse with ex - 01/30/2020   Walked RA x two laps =  approx 538ft @ brisk pace - stopped due to end of study, no sob/ no change cp  with sats of 93 % at the end of the study and no acute ekg changes   - Echo  02/13/2020  wnl with  no pericadial ef   Fatigue 11/2019   History of COVID-19 11/2019   Hyperlipidemia associated with type 2 diabetes mellitus 09/16/2015   Hypertension associated with type 2 diabetes mellitus 09/16/2015   Knee osteoarthritis 04/14/2016   Mild neurocognitive disorder 06/12/2020   Numbness of lower extremity 11/2019   Attributed to COVID-19   Sleep apnea    Patient denied being diagnosed with OSA in the past   Past Surgical History:  Procedure Laterality Date   COLONOSCOPY WITH PROPOFOL  N/A 10/31/2023   Procedure: COLONOSCOPY WITH PROPOFOL ;  Surgeon: Luke Salaam, MD;  Location[facility];  Service: Gastroenterology;  Laterality: N/A;   HERNIA REPAIR     Family History  Problem Relation Age of Onset   Hypertension Mother    Hyperlipidemia Mother    Hyperlipidemia Father    Social History   Socioeconomic History   Marital status: Divorced    Spouse name: Not on file   Number of children: 2   Years of education: 12   Highest education level: High school graduate  Occupational History   Not on file  Tobacco Use   Smoking status: Never   Smokeless tobacco: Never  Vaping Use   Vaping status: Never Used  Substance and Sexual Activity   Alcohol use: No    Alcohol/week: 0.0 standard drinks of alcohol   Drug use: No   Sexual activity: Not Currently  Other Topics Concern   Not on file  Social History Narrative   Lives alone   Social Drivers of Health   Financial Resource Strain: Low Risk  (05/10/2024)   Overall Financial Resource Strain (CARDIA)    Difficulty of Paying Living Expenses: Not hard at all  Food Insecurity: No Food Insecurity (05/10/2024)   Hunger Vital Sign    Worried About Running Out of Food in the Last Year: Never true    Ran Out of Food in the Last Year: Never true  Transportation Needs: No Transportation Needs (05/10/2024)   PRAPARE - Administrator, Civil Service (Medical): No    Lack of Transportation  (Non-Medical): No  Physical Activity: Sufficiently Active (05/10/2024)   Exercise Vital Sign    Days of Exercise per Week: 5 days    Minutes of Exercise per Session: 60 min  Stress: No Stress Concern Present (05/10/2024)   [employer] of Occupational Health - Occupational Stress Questionnaire    Feeling of Stress: Not at all  Social Connections: Moderately Isolated (05/10/2024)   Social Connection and Isolation Panel    Frequency of Communication with Friends and Family: More than three times a week    Frequency of Social Gatherings with Friends and Family: More than three times a week    Attends Religious Services: Never    Database administrator or Organizations: Yes    Attends Engineer, structural: More than 4 times per year    Marital Status: Divorced    Tobacco Counseling Counseling given: Not Answered  Clinical Intake:  Pre-visit preparation completed: Yes  Pain : No/denies pain     BMI - recorded: 29.8 Nutritional Status: BMI 25 -29 Overweight Nutritional Risks: None Diabetes: Yes CBG done?: No Did pt. bring in CBG monitor from home?: No  Lab Results  Component Value Date   HGBA1C 6.6 (A) 03/14/2024   HGBA1C 7.8 (H) 12/15/2023   HGBA1C 7.6 (A) 09/15/2023     How often do you need to have someone help you when you read instructions, pamphlets, or other written materials from your doctor or pharmacy?: 1 - Never  Interpreter Needed?: No  Information entered by :: Casey Fergusson, LPN   Activities of Daily Living     05/10/2024    8:22 AM 09/15/2023    1:16 PM  In your present state of health, do you have any difficulty performing the following activities:  Hearing? 0 0  Vision? 0 0  Difficulty concentrating or making decisions? 0 0  Comment HARDER TO REMEMBER THINGS NOW   Walking or climbing stairs? 0 0  Dressing or bathing? 0 0  Doing errands, shopping? 0 0  Preparing Food and eating ? N   Using the Toilet? N   In the past six months,  have you accidently leaked urine? N   Do you have problems with loss of bowel control? N   Managing your Medications? N   Managing your Finances? N   Housekeeping or managing your Housekeeping? N     Patient Care Team: Rockney Cid, DO as PCP - General (Internal Medicine) Bary Boss., MD (Ophthalmology)  I have updated your Care Teams any recent Medical Services you may have received from other providers in the past year.     Assessment:   This is a routine wellness examination for Casey Hunter.  Hearing/Vision screen Hearing Screening - Comments:: NO AIDS Vision Screening - Comments:: NO GLASSES, HAS LENS IMPLANTS- DR.BELL   Goals Addressed             This Visit's Progress    DIET - EAT MORE FRUITS AND VEGETABLES         Depression Screen     05/10/2024    8:18 AM 03/08/2024    9:19 AM 09/15/2023    1:15 PM 06/14/2023    1:04 PM 06/02/2023    9:43 AM 05/06/2023    8:27 AM 03/14/2023    1:15 PM  PHQ 2/9 Scores  PHQ - 2 Score 1 0 0 0 0 0 0  PHQ- 9 Score 1  0 0  0 0    Fall Risk     05/10/2024    8:21 AM 03/08/2024    9:19 AM 12/15/2023   12:58 PM 09/15/2023    1:10 PM 06/14/2023    1:04 PM  Fall Risk   Falls in the past year? 0 0 0 0 0  Number falls in past yr: 0 0 0 0 0  Injury with Fall? 0 0 0 0 0  Risk for fall due to : No Fall Risks  No Fall Risks  No Fall Risks  Follow up Falls evaluation completed  Falls evaluation completed  Falls prevention discussed;Education provided;Falls evaluation completed    MEDICARE RISK AT HOME:  Medicare Risk at Home Any stairs in or around the home?: Yes If so, are there any without handrails?: No Home free of loose throw rugs in walkways, pet beds, electrical cords, etc?: Yes Adequate lighting in your home to reduce risk of falls?:  Yes Life alert?: No Use of a cane, walker or w/c?: No Grab bars in the bathroom?: Yes Shower chair or bench in shower?: No Elevated toilet seat or a handicapped toilet?: Yes  TIMED UP  AND GO:  Was the test performed?  No  Cognitive Function: 6CIT completed        05/10/2024    8:24 AM 05/06/2023    8:41 AM  6CIT Screen  What Year? 0 points 0 points  What month? 0 points 0 points  What time? 0 points 0 points  Count back from 20 0 points 0 points  Months in reverse 0 points 0 points  Repeat phrase 0 points 0 points  Total Score 0 points 0 points    Immunizations Immunization History  Administered Date(s) Administered   Fluad  Quad(high Dose 65+) 10/02/2021   Fluad  Trivalent(High Dose 65+) 10/13/2023   Influenza, High Dose Seasonal PF 09/02/2020   Influenza,inj,Quad PF,6+ Mos 09/17/2015, 09/21/2017   Influenza-Unspecified 10/09/2016, 09/17/2018, 09/23/2019   Moderna Sars-Covid-2 Vaccination 03/13/2020, 04/10/2020, 11/06/2020   PNEUMOCOCCAL CONJUGATE-20 12/23/2022   Pneumococcal Conjugate-13 11/17/2020   Pneumococcal Polysaccharide-23 12/05/2006   Td 05/13/2005   Tdap 01/08/2016, 12/03/2022, 08/10/2023   Zoster Recombinant(Shingrix) 10/13/2020    Screening Tests Health Maintenance  Topic Date Due   OPHTHALMOLOGY EXAM  11/08/2018   COVID-19 Vaccine (4 - 2024-25 season) 07/24/2023   Zoster Vaccines- Shingrix (2 of 2) 06/13/2024 (Originally 12/08/2020)   INFLUENZA VACCINE  06/22/2024   HEMOGLOBIN A1C  09/13/2024   Diabetic kidney evaluation - eGFR measurement  12/14/2024   Diabetic kidney evaluation - Urine ACR  12/14/2024   FOOT EXAM  03/14/2025   Medicare Annual Wellness (AWV)  05/10/2025   DTaP/Tdap/Td (5 - Td or Tdap) 08/09/2033   Colonoscopy  10/30/2033   Pneumococcal Vaccine: 50+ Years  Completed   Hepatitis C Screening  Completed   HPV VACCINES  Aged Out   Meningococcal B Vaccine  Aged Out    Health Maintenance  Health Maintenance Due  Topic Date Due   OPHTHALMOLOGY EXAM  11/08/2018   COVID-19 Vaccine (4 - 2024-25 season) 07/24/2023   Health Maintenance Items Addressed: UP TO DATE ON SHOTS, BUT WANTS NO MORE COVIDS;UP TO DATE ON  COLONOSCOPY  Additional Screening:  Vision Screening: Recommended annual ophthalmology exams for early detection of glaucoma and other disorders of the eye. Would you like a referral to an eye doctor? No    Dental Screening: Recommended annual dental exams for proper oral hygiene  Community Resource Referral / Chronic Care Management: CRR required this visit?  No   CCM required this visit?  No   Plan:    I have personally reviewed and noted the following in the patient's chart:   Medical and social history Use of alcohol, tobacco or illicit drugs  Current medications and supplements including opioid prescriptions. Patient is not currently taking opioid prescriptions. Functional ability and status Nutritional status Physical activity Advanced directives List of other physicians Hospitalizations, surgeries, and ER visits in previous 12 months Vitals Screenings to include cognitive, depression, and falls Referrals and appointments  In addition, I have reviewed and discussed with patient certain preventive protocols, quality metrics, and best practice recommendations. A written personalized care plan for preventive services as well as general preventive health recommendations were provided to patient.   Pinky Bright, LPN   02/28/8118   After Visit Summary: (MyChart) Due to this being a telephonic visit, the after visit summary with patients personalized plan was offered  to patient via MyChart   Notes: Nothing significant to report at this time.

## 2024-05-10 NOTE — Patient Instructions (Addendum)
 Casey Hunter , Thank you for taking time out of your busy schedule to complete your Annual Wellness Visit with me. I enjoyed our conversation and look forward to speaking with you again next year. I, as well as your care team,  appreciate your ongoing commitment to your health goals. Please review the following plan we discussed and let me know if I can assist you in the future.   Follow up Visits: Next Medicare AWV with our clinical staff:   05/16/25 @ 3:20 PM BY PHONE Have you seen your provider in the last 6 months (3 months if uncontrolled diabetes)? Yes   Clinician Recommendations:  Aim for 30 minutes of exercise or brisk walking, 6-8 glasses of water, and 5 servings of fruits and vegetables each day. TAKE CARE!      This is a list of the screening recommended for you and due dates:  Health Maintenance  Topic Date Due   Eye exam for diabetics  11/08/2018   COVID-19 Vaccine (4 - 2024-25 season) 07/24/2023   Zoster (Shingles) Vaccine (2 of 2) 06/13/2024*   Flu Shot  06/22/2024   Hemoglobin A1C  09/13/2024   Yearly kidney function blood test for diabetes  12/14/2024   Yearly kidney health urinalysis for diabetes  12/14/2024   Complete foot exam   03/14/2025   Medicare Annual Wellness Visit  05/10/2025   DTaP/Tdap/Td vaccine (5 - Td or Tdap) 08/09/2033   Colon Cancer Screening  10/30/2033   Pneumococcal Vaccine for age over 60  Completed   Hepatitis C Screening  Completed   HPV Vaccine  Aged Out   Meningitis B Vaccine  Aged Out  *Topic was postponed. The date shown is not the original due date.    Advanced directives: (ACP Link)Information on Advanced Care Planning can be found at [place]  Secretary of [facility] Advance Health Care Directives Advance Health Care Directives. [email or web address]  Advance Care Planning is important because it:  [x]  Makes sure you receive the medical care that is consistent with your values, goals, and preferences  [x]  It provides guidance to your family and  loved ones and reduces their decisional burden about whether or not they are making the right decisions based on your wishes.  Follow the link provided in your after visit summary or read over the paperwork we have mailed to you to help you started getting your Advance Directives in place. If you need assistance in completing these, please reach out to us  so that we can help you!

## 2024-05-18 ENCOUNTER — Other Ambulatory Visit: Payer: Self-pay | Admitting: Internal Medicine

## 2024-05-18 ENCOUNTER — Telehealth: Payer: Self-pay | Admitting: Pharmacy Technician

## 2024-05-18 ENCOUNTER — Other Ambulatory Visit: Payer: Self-pay | Admitting: Physical Medicine and Rehabilitation

## 2024-05-18 ENCOUNTER — Other Ambulatory Visit: Payer: Self-pay

## 2024-05-18 ENCOUNTER — Other Ambulatory Visit (HOSPITAL_COMMUNITY): Payer: Self-pay

## 2024-05-18 ENCOUNTER — Telehealth: Payer: Self-pay

## 2024-05-18 DIAGNOSIS — E1142 Type 2 diabetes mellitus with diabetic polyneuropathy: Secondary | ICD-10-CM

## 2024-05-18 NOTE — Telephone Encounter (Unsigned)
 Copied from CRM [id number]. Topic: Clinical - Medication Refill >> May 18, 2024  1:51 PM Marissa P wrote: Medication: ozempic  and freestyle libre sensor, 90 day supplies for both please   Has the patient contacted their pharmacy? Yes (Agent: If no, request that the patient contact the pharmacy for the refill. If patient does not wish to contact the pharmacy document the reason why and proceed with request.) (Agent: If yes, when and what did the pharmacy advise?)  This is the patient's preferred pharmacy:   [facility] [address] [place] [address] Phone: [phone] Fax: [phone]  Is this the correct pharmacy for this prescription? Yes If no, delete pharmacy and type the correct one.   Has the prescription been filled recently? Yes  Is the patient out of the medication? Yes  Has the patient been seen for an appointment in the last year OR does the patient have an upcoming appointment? Yes  Can we respond through MyChart? No  Agent: Please be advised that Rx refills may take up to 3 business days. We ask that you follow-up with your pharmacy.

## 2024-05-18 NOTE — Telephone Encounter (Signed)
 Pharmacy Patient Advocate Encounter   Received notification from CoverMyMeds that prior authorization for Ozempic  (2 MG/DOSE) 8MG /3ML pen-injectors is required/requested.   Insurance verification completed.   The patient is insured through [facility] ADVANTAGE/RX ADVANCE .   Per test claim: PA required; PA submitted to above mentioned insurance via CoverMyMeds Key/confirmation #/EOC [id number] Status is pending

## 2024-05-18 NOTE — Telephone Encounter (Signed)
 Copied from CRM [id number]. Topic: Clinical - Medication Prior Auth >> May 18, 2024  4:00 PM Santiya F wrote: Reason for CRM: [employer] is calling in because they wanted to confirm the quantity regarding patient's Ozempic . This is in regards to a prior authorization they received.

## 2024-05-18 NOTE — Telephone Encounter (Signed)
 Patient also requesting freesyle libre sensors

## 2024-05-21 ENCOUNTER — Other Ambulatory Visit (HOSPITAL_COMMUNITY): Payer: Self-pay

## 2024-05-21 ENCOUNTER — Telehealth: Payer: Self-pay

## 2024-05-21 ENCOUNTER — Other Ambulatory Visit: Payer: Self-pay

## 2024-05-21 DIAGNOSIS — H5340 Unspecified visual field defects: Secondary | ICD-10-CM | POA: Diagnosis not present

## 2024-05-21 DIAGNOSIS — H02832 Dermatochalasis of right lower eyelid: Secondary | ICD-10-CM | POA: Diagnosis not present

## 2024-05-21 DIAGNOSIS — H02403 Unspecified ptosis of bilateral eyelids: Secondary | ICD-10-CM | POA: Diagnosis not present

## 2024-05-21 DIAGNOSIS — L578 Other skin changes due to chronic exposure to nonionizing radiation: Secondary | ICD-10-CM | POA: Diagnosis not present

## 2024-05-21 DIAGNOSIS — H02002 Unspecified entropion of right lower eyelid: Secondary | ICD-10-CM | POA: Diagnosis not present

## 2024-05-21 MED FILL — Continuous Glucose System Sensor: 84 days supply | Qty: 6 | Fill #0 | Status: AC

## 2024-05-21 NOTE — Telephone Encounter (Signed)
 Pharmacy Patient Advocate Encounter  Received notification from [employer] that Prior Authorization for Ozempic  (2 MG/DOSE) 8MG /3ML pen-injectors has been APPROVED from 05/18/24 to 05/18/25. Ran test claim, Copay is $0.00. This test claim was processed through [facility]- copay amounts may vary at other pharmacies due to pharmacy/plan contracts, or as the patient moves through the different stages of their insurance plan.   PA #/Case ID/Reference #: [id number]

## 2024-05-21 NOTE — Telephone Encounter (Signed)
Sensor sent

## 2024-05-21 NOTE — Telephone Encounter (Signed)
 Requested Prescriptions  Pending Prescriptions Disp Refills   Semaglutide , 2 MG/DOSE, 8 MG/3ML SOPN 6 mL 1    Sig: Inject 2 mg as directed once a week.     Endocrinology:  Diabetes - GLP-1 Receptor Agonists - semaglutide  Failed - 05/21/2024  1:38 PM      Failed - HBA1C in normal range and within 180 days    Hemoglobin A1C  Date Value Ref Range Status  03/14/2024 6.6 (A) 4.0 - 5.6 % Final   HB A1C (BAYER DCA - WAIVED)  Date Value Ref Range Status  11/17/2020 9.2 (H) <7.0 % Final    Comment:                                          Diabetic Adult            <7.0                                       Healthy Adult        4.3 - 5.7                                                           (DCCT/NGSP) American Diabetes Association's Summary of Glycemic Recommendations for Adults with Diabetes: Hemoglobin A1c <7.0%. More stringent glycemic goals (A1c <6.0%) may further reduce complications at the cost of increased risk of hypoglycemia.    Hgb A1c MFr Bld  Date Value Ref Range Status  12/15/2023 7.8 (H) <5.7 % of total Hgb Final    Comment:    For someone without known diabetes, a hemoglobin A1c value of 6.5% or greater indicates that they may have  diabetes and this should be confirmed with a follow-up  test. . For someone with known diabetes, a value <7% indicates  that their diabetes is well controlled and a value  greater than or equal to 7% indicates suboptimal  control. A1c targets should be individualized based on  duration of diabetes, age, comorbid conditions, and  other considerations. . Currently, no consensus exists regarding use of hemoglobin A1c for diagnosis of diabetes for children. .          Passed - Cr in normal range and within 360 days    Creat  Date Value Ref Range Status  12/15/2023 0.92 0.70 - 1.35 mg/dL Final   Creatinine, Urine  Date Value Ref Range Status  12/15/2023 15 (L) 20 - 320 mg/dL Final         Passed - Valid encounter within last 6  months    Recent Outpatient Visits           2 months ago Type 2 diabetes mellitus with diabetic polyneuropathy, without long-term current use of insulin [facility])   [place] [facility] Bernardo Fend, DO       Future Appointments             In 4 months Bernardo Fend, DO [facility], [facility]

## 2024-05-21 NOTE — Telephone Encounter (Signed)
 Requested medication (s) are due for refill today: routing for review  Requested medication (s) are on the active medication list: no  Last refill:  unknown  Future visit scheduled: yes  Notes to clinic:  not on current med list, possible new Rx needed.     Requested Prescriptions  Pending Prescriptions Disp Refills   Continuous Glucose Sensor (FREESTYLE LIBRE 2 SENSOR) MISC [Pharmacy Med Name: Continuous Glucose Sensor (FREESTYLE LIBRE 2 SENSOR) Misc] 6 each 3    Sig: Use as directed, change sensor every 14 days     Endocrinology: Diabetes - Testing Supplies Passed - 05/21/2024  2:29 PM      Passed - Valid encounter within last 12 months    Recent Outpatient Visits           2 months ago Type 2 diabetes mellitus with diabetic polyneuropathy, without long-term current use of insulin [facility])   [place] [facility] Bernardo Fend, DO       Future Appointments             In 4 months Bernardo Fend, DO [facility], [facility]

## 2024-05-21 NOTE — Telephone Encounter (Signed)
 Copied from CRM [id number]. Topic: Clinical - Prescription Issue >> May 21, 2024 11:00 AM Berneda FALCON wrote: Reason for CRM: Pt states that this medication was not yet refilled but the other one (Ozempic ) was. He would like to know why and also wanted to know why it is not listed on his medication list, please.States he does not wish to go to the pharmacy multiple times.  Medication is: Continuous Blood Gluc Sensor (FREESTYLE LIBRE 14 DAY SENSOR  Preferred pharmacy is: [facility] [address] [place] [address] Phone: [phone] Fax: [phone]  Patient callback is [phone]

## 2024-05-22 ENCOUNTER — Other Ambulatory Visit: Payer: Self-pay

## 2024-05-30 ENCOUNTER — Other Ambulatory Visit: Payer: Self-pay | Admitting: Physical Medicine and Rehabilitation

## 2024-06-01 ENCOUNTER — Other Ambulatory Visit: Payer: Self-pay

## 2024-06-06 DIAGNOSIS — G629 Polyneuropathy, unspecified: Secondary | ICD-10-CM | POA: Diagnosis not present

## 2024-06-08 ENCOUNTER — Encounter: Payer: Self-pay | Admitting: Physical Medicine and Rehabilitation

## 2024-06-08 ENCOUNTER — Other Ambulatory Visit: Payer: Self-pay

## 2024-06-08 ENCOUNTER — Encounter: Payer: PPO | Attending: Physical Medicine and Rehabilitation | Admitting: Physical Medicine and Rehabilitation

## 2024-06-08 VITALS — BP 121/74 | HR 81 | Ht 74.0 in | Wt 232.4 lb

## 2024-06-08 DIAGNOSIS — I10 Essential (primary) hypertension: Secondary | ICD-10-CM | POA: Diagnosis not present

## 2024-06-08 DIAGNOSIS — E1142 Type 2 diabetes mellitus with diabetic polyneuropathy: Secondary | ICD-10-CM | POA: Diagnosis not present

## 2024-06-08 DIAGNOSIS — Z794 Long term (current) use of insulin: Secondary | ICD-10-CM

## 2024-06-08 MED ORDER — JOURNAVX 50 MG PO TABS
1.0000 | ORAL_TABLET | Freq: Every day | ORAL | 3 refills | Status: DC | PRN
  Filled 2024-06-08: qty 30, 30d supply, fill #0
  Filled 2024-08-07: qty 30, 30d supply, fill #1

## 2024-06-08 MED ORDER — PREGABALIN 75 MG PO CAPS
75.0000 mg | ORAL_CAPSULE | Freq: Four times a day (QID) | ORAL | 3 refills | Status: DC
  Filled 2024-06-08: qty 300, 75d supply, fill #0

## 2024-06-08 MED ORDER — TADALAFIL 5 MG PO TABS
5.0000 mg | ORAL_TABLET | Freq: Every day | ORAL | 0 refills | Status: AC | PRN
Start: 2024-06-08 — End: ?
  Filled 2024-06-08: qty 10, 10d supply, fill #0

## 2024-06-08 MED ORDER — CAPSAICIN-CLEANSING GEL 8 % EX KIT
4.0000 | PACK | Freq: Once | CUTANEOUS | Status: AC
  Administered 2024-06-08: 4 via TOPICAL

## 2024-06-08 MED ORDER — NIFEDIPINE ER OSMOTIC RELEASE 30 MG PO TB24
30.0000 mg | ORAL_TABLET | ORAL | 3 refills | Status: AC
  Filled 2024-06-08: qty 45, 90d supply, fill #0

## 2024-06-08 MED ORDER — TAMSULOSIN HCL 0.4 MG PO CAPS
0.8000 mg | ORAL_CAPSULE | Freq: Every day | ORAL | 3 refills | Status: AC
  Filled 2024-06-08: qty 180, 90d supply, fill #0

## 2024-06-08 NOTE — Patient Instructions (Addendum)
 Relaxium

## 2024-06-08 NOTE — Progress Notes (Signed)
-  Discussed Qutenza as an option for neuropathic pain control. Discussed that this is a capsaicin patch, stronger than capsaicin cream. Discussed that it is currently approved for diabetic peripheral neuropathy and post-herpetic neuralgia, but that it has also shown benefit in treating other forms of neuropathy. Provided patient with link to site to learn more about the patch: https://www.clark.biz/. Discussed that the patch would be placed in office and benefits usually last 3 months. Discussed that unintended exposure to capsaicin can cause severe irritation of eyes, mucous membranes, respiratory tract, and skin, but that Qutenza is a local treatment and does not have the systemic side effects of other nerve medications. Discussed that there may be pain, itching, erythema, and decreased sensory function associated with the application of Qutenza. Side effects usually subside within 1 week. A cold pack of analgesic medications can help with these side effects. Blood pressure can also be increased due to pain associated with administration of the patch.   4 patches of Qutenza [phone]) was applied to the bilateral feet. Ice packs were applied during the procedure to ensure patient comfort. Blood pressure was monitored every 15 minutes. The patient tolerated the procedure well. Post-procedure instructions were given and follow-up has been scheduled.  Topical system measures 14cm x20cm (280cm for a total 1120units) were applied which will cause deeper penetration for destruction of the peripheral nerve using a chemical (Qutenza) which infuses into the skin like an injection and heat technique (occlusive, compressive dressing cauing endothermic heat technique)

## 2024-06-08 NOTE — Addendum Note (Signed)
 Addended by: GEORGINA BARI CROME on: 06/08/2024 11:24 AM   Modules accepted: Orders

## 2024-06-08 NOTE — Addendum Note (Signed)
 Addended by: LORILEE SVEN SQUIBB on: 06/08/2024 10:37 AM   Modules accepted: Orders

## 2024-06-13 ENCOUNTER — Other Ambulatory Visit: Payer: Self-pay | Admitting: Physical Medicine and Rehabilitation

## 2024-06-13 MED ORDER — PREGABALIN 100 MG PO CAPS
100.0000 mg | ORAL_CAPSULE | Freq: Three times a day (TID) | ORAL | 3 refills | Status: AC
Start: 2024-06-13 — End: 2025-06-13

## 2024-06-18 ENCOUNTER — Other Ambulatory Visit: Payer: Self-pay

## 2024-06-18 DIAGNOSIS — D2262 Melanocytic nevi of left upper limb, including shoulder: Secondary | ICD-10-CM | POA: Diagnosis not present

## 2024-06-18 DIAGNOSIS — L400 Psoriasis vulgaris: Secondary | ICD-10-CM | POA: Diagnosis not present

## 2024-06-18 DIAGNOSIS — D2261 Melanocytic nevi of right upper limb, including shoulder: Secondary | ICD-10-CM | POA: Diagnosis not present

## 2024-06-18 DIAGNOSIS — D2239 Melanocytic nevi of other parts of face: Secondary | ICD-10-CM | POA: Diagnosis not present

## 2024-06-18 DIAGNOSIS — L821 Other seborrheic keratosis: Secondary | ICD-10-CM | POA: Diagnosis not present

## 2024-06-18 DIAGNOSIS — L570 Actinic keratosis: Secondary | ICD-10-CM | POA: Diagnosis not present

## 2024-06-18 DIAGNOSIS — D225 Melanocytic nevi of trunk: Secondary | ICD-10-CM | POA: Diagnosis not present

## 2024-06-18 MED ORDER — CALCIPOTRIENE 0.005 % EX CREA
TOPICAL_CREAM | Freq: Two times a day (BID) | CUTANEOUS | 2 refills | Status: AC
  Filled 2024-06-18: qty 60, 30d supply, fill #0

## 2024-06-18 MED ORDER — CLOBETASOL PROPIONATE 0.05 % EX OINT
TOPICAL_OINTMENT | Freq: Two times a day (BID) | CUTANEOUS | 1 refills | Status: AC
  Filled 2024-06-18: qty 45, 22d supply, fill #0

## 2024-06-20 ENCOUNTER — Other Ambulatory Visit: Payer: Self-pay

## 2024-06-20 DIAGNOSIS — Z7982 Long term (current) use of aspirin: Secondary | ICD-10-CM | POA: Diagnosis not present

## 2024-06-20 DIAGNOSIS — H53453 Other localized visual field defect, bilateral: Secondary | ICD-10-CM | POA: Diagnosis not present

## 2024-06-20 DIAGNOSIS — Z7984 Long term (current) use of oral hypoglycemic drugs: Secondary | ICD-10-CM | POA: Diagnosis not present

## 2024-06-20 DIAGNOSIS — Z880 Allergy status to penicillin: Secondary | ICD-10-CM | POA: Diagnosis not present

## 2024-06-20 DIAGNOSIS — H02403 Unspecified ptosis of bilateral eyelids: Secondary | ICD-10-CM | POA: Diagnosis not present

## 2024-06-20 DIAGNOSIS — H02834 Dermatochalasis of left upper eyelid: Secondary | ICD-10-CM | POA: Diagnosis not present

## 2024-06-20 DIAGNOSIS — Z79899 Other long term (current) drug therapy: Secondary | ICD-10-CM | POA: Diagnosis not present

## 2024-06-20 DIAGNOSIS — I10 Essential (primary) hypertension: Secondary | ICD-10-CM | POA: Diagnosis not present

## 2024-06-20 DIAGNOSIS — E119 Type 2 diabetes mellitus without complications: Secondary | ICD-10-CM | POA: Diagnosis not present

## 2024-06-20 DIAGNOSIS — H02831 Dermatochalasis of right upper eyelid: Secondary | ICD-10-CM | POA: Diagnosis not present

## 2024-06-20 DIAGNOSIS — Z7985 Long-term (current) use of injectable non-insulin antidiabetic drugs: Secondary | ICD-10-CM | POA: Diagnosis not present

## 2024-06-20 MED ORDER — ASPIRIN 81 MG PO TBEC
81.0000 mg | DELAYED_RELEASE_TABLET | Freq: Every day | ORAL | 1 refills | Status: AC
  Filled 2024-06-20: qty 30, 30d supply, fill #0
  Filled 2024-08-07: qty 30, 30d supply, fill #1

## 2024-06-22 ENCOUNTER — Other Ambulatory Visit: Payer: Self-pay

## 2024-06-29 ENCOUNTER — Other Ambulatory Visit: Payer: Self-pay

## 2024-07-07 DIAGNOSIS — G629 Polyneuropathy, unspecified: Secondary | ICD-10-CM | POA: Diagnosis not present

## 2024-07-16 ENCOUNTER — Other Ambulatory Visit: Payer: Self-pay

## 2024-07-16 ENCOUNTER — Other Ambulatory Visit: Payer: Self-pay | Admitting: Internal Medicine

## 2024-07-16 DIAGNOSIS — E1142 Type 2 diabetes mellitus with diabetic polyneuropathy: Secondary | ICD-10-CM

## 2024-07-17 ENCOUNTER — Other Ambulatory Visit: Payer: Self-pay

## 2024-07-17 ENCOUNTER — Other Ambulatory Visit: Payer: Self-pay | Admitting: Internal Medicine

## 2024-07-17 ENCOUNTER — Other Ambulatory Visit (HOSPITAL_COMMUNITY): Payer: Self-pay

## 2024-07-17 DIAGNOSIS — E1142 Type 2 diabetes mellitus with diabetic polyneuropathy: Secondary | ICD-10-CM

## 2024-07-17 MED ORDER — OZEMPIC (2 MG/DOSE) 8 MG/3ML ~~LOC~~ SOPN
2.0000 mg | PEN_INJECTOR | SUBCUTANEOUS | 1 refills | Status: DC
  Filled 2024-07-17: qty 6, 56d supply, fill #0
  Filled 2024-09-03: qty 6, 56d supply, fill #1

## 2024-07-17 NOTE — Telephone Encounter (Signed)
 Requested Prescriptions  Pending Prescriptions Disp Refills   Semaglutide , 2 MG/DOSE, (OZEMPIC , 2 MG/DOSE,) 8 MG/3ML SOPN 6 mL 1    Sig: Inject 2 mg as directed once a week.     Endocrinology:  Diabetes - GLP-1 Receptor Agonists - semaglutide  Failed - 07/17/2024  3:17 PM      Failed - HBA1C in normal range and within 180 days    Hemoglobin A1C  Date Value Ref Range Status  03/14/2024 6.6 (A) 4.0 - 5.6 % Final   HB A1C (BAYER DCA - WAIVED)  Date Value Ref Range Status  11/17/2020 9.2 (H) <7.0 % Final    Comment:                                          Diabetic Adult            <7.0                                       Healthy Adult        4.3 - 5.7                                                           (DCCT/NGSP) American Diabetes Association's Summary of Glycemic Recommendations for Adults with Diabetes: Hemoglobin A1c <7.0%. More stringent glycemic goals (A1c <6.0%) may further reduce complications at the cost of increased risk of hypoglycemia.    Hgb A1c MFr Bld  Date Value Ref Range Status  12/15/2023 7.8 (H) <5.7 % of total Hgb Final    Comment:    For someone without known diabetes, a hemoglobin A1c value of 6.5% or greater indicates that they may have  diabetes and this should be confirmed with a follow-up  test. . For someone with known diabetes, a value <7% indicates  that their diabetes is well controlled and a value  greater than or equal to 7% indicates suboptimal  control. A1c targets should be individualized based on  duration of diabetes, age, comorbid conditions, and  other considerations. . Currently, no consensus exists regarding use of hemoglobin A1c for diagnosis of diabetes for children. .          Passed - Cr in normal range and within 360 days    Creat  Date Value Ref Range Status  12/15/2023 0.92 0.70 - 1.35 mg/dL Final   Creatinine, Urine  Date Value Ref Range Status  12/15/2023 15 (L) 20 - 320 mg/dL Final         Passed - Valid  encounter within last 6 months    Recent Outpatient Visits           4 months ago Type 2 diabetes mellitus with diabetic polyneuropathy, without long-term current use of insulin [facility])   [facility] Bernardo Fend, DO       Future Appointments             In 2 months Bernardo Fend, DO [facility], [facility] [place]

## 2024-08-07 ENCOUNTER — Other Ambulatory Visit (HOSPITAL_BASED_OUTPATIENT_CLINIC_OR_DEPARTMENT_OTHER): Payer: Self-pay

## 2024-08-07 ENCOUNTER — Other Ambulatory Visit: Payer: Self-pay

## 2024-08-07 DIAGNOSIS — G629 Polyneuropathy, unspecified: Secondary | ICD-10-CM | POA: Diagnosis not present

## 2024-08-07 MED FILL — Continuous Glucose System Sensor: 84 days supply | Qty: 6 | Fill #1 | Status: AC

## 2024-08-13 ENCOUNTER — Encounter: Attending: Physical Medicine and Rehabilitation | Admitting: Physical Medicine and Rehabilitation

## 2024-08-13 DIAGNOSIS — R202 Paresthesia of skin: Secondary | ICD-10-CM | POA: Diagnosis not present

## 2024-08-13 DIAGNOSIS — R200 Anesthesia of skin: Secondary | ICD-10-CM | POA: Insufficient documentation

## 2024-08-13 NOTE — Progress Notes (Signed)
 Subjective:    Patient ID: Casey Hunter., male    DOB: Jan 29, 1956, 68 y.o.   MRN: [id number]  HPI  An audio/video tele-health visit is felt to be the most appropriate encounter for this patient at this time. This is a follow up tele-visit via phone. The patient is at home. MD is at office. Prior to scheduling this appointment, our staff discussed the limitations of evaluation and management by telemedicine and the availability of in-person appointments. The patient expressed understanding and agreed to proceed.   Casey Hunter returns for f/u LONG COVID, Vitamin D  deficiency, and HLD, and type 2 diabetes  1) Long COVID symptoms -leg numbness started with COVID infection and has been getting worse -feeling a lot better -eating oysters, salmon, liver once per week- continues with this -still has some fatigue -left leg still falls asleep- ready to repeat Qutenza  today -some nights it can be pretty bad -it is not usually painful -Casey Hunter is trying to work on his diet. -Casey Hunter is taking the Lyrica  two at night before sleeping -Casey Hunter has taken some ivermectin-was taking it every day when Casey Hunter felt bad.  -Casey Hunter asks about taking Pepcid  and Claritin -does feel much better -feels like Casey Hunter is getting enough protein -Casey Hunter does not eat much citrus fruits due to his diabetes.  -Casey Hunter loves to eat strawberries and blackberries -checked testosterone  level and discussed that his levels  -discussed vitamin D  level and high dose supplementation -lot of stress since March 1st since his dad felt and broke his hip  2) Diabetic peripheral neuropathy: -pain has been stable -has been trying to give up bread and potatoes and all his sweets -both feet feel cold, continues to take nifedipine .  -Qutenza  helps, would like to try again today -hemoglobin A1c 7.8 on 9/15, increased to 9.9 in January, and 10.1 on recent check -Casey Hunter has been very stressed by his parents' health -ready to repeat Qutenza  today -CBGs better controlled-  less than 150s -eating 1 egg at breakfast and drinks unsweet tea -weight is 248 lbs today -discussed his diet thoroughly.  -got 60% improvement with Qutenza  first time, even better improvement this last time but Casey Hunter can't quantify it.  -restarted Trulicity  -Ozempic  was denied for him.  -pain is mostly present on soles of both feet- sometimes in his toes -tolerated last session well without any burning -discussed his hemoglobin A1c  3) BPH: -the tamsulosin  has been helping him. -only urinating twice per night  4) Fatigue -Contnues to have severe fatigue at times.  -yesterday was a bad tay -panning to return in August.   5) HTN: -BP is 118/75 -has been well controlled -now off all blood pressure medications except for flomax  0.4mg .  -Casey Hunter has been checking BP at home and it has been stable.  -Casey Hunter likes cinnamon  6) Raynaud's syndrome: -the nifedipine  helped his upper extremity neuropathy but his lower extremities are still very cold at night -Casey Hunter continues to take this  7) History of multiple acute COVID infections -Casey Hunter asks why Casey Hunter is getting these infections every year -Casey Hunter is feeling better than when Casey Hunter called me on Saturday -Casey Hunter asks for how long Casey Hunter should quarantine.   8) Vitamin D  deficiency -discussed that level has improved to 40s, and has remained stable there since we checked 4 months ago -discussed that this was an improvement from the first time we checked -prescribed refill for 20 weeks of weekly high dose supplement -would like to repeat today  9) elevated  triglycerides -recommended avoiding processed fodos -repatha  was not covered and Casey Hunter would like to see   10) Accident on Friday -large piece of wood hit him in his face.  -broke his nose -had 6 stitches -really scared him  11) Impaired cognition: -his brain function is not what was it was before COVID but it much removed  Casey Hunter continues to have numbness in his left calf. It is better than initially. His left  hand will also sleep. It hurts but Casey Hunter is not sure if this is pain.  Casey Hunter has been having increase readings on his Freestyle Libre device- around 200s. For breakfast Casey Hunter eats eggs and sausage. For lunch Casey Hunter eats barebcue and sometimes french fries. Once in a while Casey Hunter eats sugar. Casey Hunter drinks water and unsweet tea and a fruit drink. Casey Hunter eats two bananas every day.   Casey Hunter has been checking pressures every day and they have been well controlled. Casey Hunter takes his BP medication 5 days per week.  Prior history: Dizziness is better, but still present at times. Casey Hunter has brought with him an excellent log of his AM and PM blood pressures, which are excellent off any blood pressure medications.   Obesity: Casey Hunter has made excellent changes to his diet. Casey Hunter has lost 2 lbs since last visit (currently 266 lbs). Casey Hunter has been eating one banana per day, eggs daily, salmon once per week, meat, and a lot of nuts. Casey Hunter feels Casey Hunter still needs to minimize sweets.   Energy: Has greatly improved but is still not at the level required for his work. Casey Hunter has weaned off the Ritalin . Discussed goal of restarting work on October 11th for three days per week and Casey Hunter is agreeable.   Reviewed PCP note and Casey Hunter has been doing much better. HbA1c has improved to 7.7  Left leg numbness continues but is slightly improved.     Pain Inventory Average Pain 4 Pain Right Now 7 My pain is intermittent, constant, and tingling  In the last 24 hours, has pain interfered with the following? General activity 4 Relation with others 2 Enjoyment of life 5 What TIME of day is your pain at its worst? varies Sleep (in general) Fair  Pain is worse with: walking, bending, and some activites Pain improves with: medication Relief from Meds: 5  Family History  Problem Relation Age of Onset   Hypertension Mother    Hyperlipidemia Mother    Hyperlipidemia Father    Social History   Socioeconomic History   Marital status: Divorced    Spouse name: Not on file    Number of children: 2   Years of education: 12   Highest education level: High school graduate  Occupational History   Not on file  Tobacco Use   Smoking status: Never   Smokeless tobacco: Never  Vaping Use   Vaping status: Never Used  Substance and Sexual Activity   Alcohol use: No    Alcohol/week: 0.0 standard drinks of alcohol   Drug use: No   Sexual activity: Not Currently  Other Topics Concern   Not on file  Social History Narrative   Lives alone   Social Drivers of Health   Financial Resource Strain: Low Risk  (05/10/2024)   Overall Financial Resource Strain (CARDIA)    Difficulty of Paying Living Expenses: Not hard at all  Food Insecurity: No Food Insecurity (05/10/2024)   Hunger Vital Sign    Worried About Running Out of Food in the Last Year: Never true  Ran Out of Food in the Last Year: Never true  Transportation Needs: No Transportation Needs (05/10/2024)   PRAPARE - Administrator, Civil Service (Medical): No    Lack of Transportation (Non-Medical): No  Physical Activity: Sufficiently Active (05/10/2024)   Exercise Vital Sign    Days of Exercise per Week: 5 days    Minutes of Exercise per Session: 60 min  Stress: No Stress Concern Present (05/10/2024)   [employer] of Occupational Health - Occupational Stress Questionnaire    Feeling of Stress: Not at all  Social Connections: Moderately Isolated (05/10/2024)   Social Connection and Isolation Panel    Frequency of Communication with Friends and Family: More than three times a week    Frequency of Social Gatherings with Friends and Family: More than three times a week    Attends Religious Services: Never    Database administrator or Organizations: Yes    Attends Banker Meetings: More than 4 times per year    Marital Status: Divorced   Past Surgical History:  Procedure Laterality Date   COLONOSCOPY WITH PROPOFOL  N/A 10/31/2023   Procedure: COLONOSCOPY WITH PROPOFOL ;  Surgeon:  Therisa Bi, MD;  Location: [facility] ENDOSCOPY;  Service: Gastroenterology;  Laterality: N/A;   HERNIA REPAIR     Past Surgical History:  Procedure Laterality Date   COLONOSCOPY WITH PROPOFOL  N/A 10/31/2023   Procedure: COLONOSCOPY WITH PROPOFOL ;  Surgeon: Therisa Bi, MD;  Location: [facility] ENDOSCOPY;  Service: Gastroenterology;  Laterality: N/A;   HERNIA REPAIR     Past Medical History:  Diagnosis Date   Diabetic peripheral neuropathy associated with type 2 diabetes mellitus 03/14/2017   DOE (dyspnea on exertion) 11/2019   Onset jan 2021 with covid 19  - assoc with atypical cp developed during the infection present mostly sitting/ absent supine/ better with deep breathing/ no worse with ex - 01/30/2020   Walked RA x two laps =  approx 530ft @ brisk pace - stopped due to end of study, no sob/ no change cp  with sats of 93 % at the end of the study and no acute ekg changes  - Echo  02/13/2020  wnl with  no pericadial ef   Fatigue 11/2019   History of COVID-19 11/2019   Hyperlipidemia associated with type 2 diabetes mellitus 09/16/2015   Hypertension associated with type 2 diabetes mellitus 09/16/2015   Knee osteoarthritis 04/14/2016   Mild neurocognitive disorder 06/12/2020   Numbness of lower extremity 11/2019   Attributed to COVID-19   Sleep apnea    Patient denied being diagnosed with OSA in the past   There were no vitals taken for this visit.  Opioid Risk Score:   Fall Risk Score:  `1  Depression screen [facility] 2/9     06/08/2024    9:45 AM 05/10/2024    8:18 AM 03/08/2024    9:19 AM 09/15/2023    1:15 PM 06/14/2023    1:04 PM 06/02/2023    9:43 AM 05/06/2023    8:27 AM  Depression screen PHQ 2/9  Decreased Interest 0 0 0 0 0 0 0  Down, Depressed, Hopeless 0 1 0 0 0 0 0  PHQ - 2 Score 0 1 0 0 0 0 0  Altered sleeping  0  0 0  0  Tired, decreased energy  0  0 0    Change in appetite  0  0 0    Feeling bad or failure about yourself  0  0 0    Trouble concentrating  0  0 0    Moving  slowly or fidgety/restless  0  0 0    Suicidal thoughts  0  0 0    PHQ-9 Score  1  0 0  0  Difficult doing work/chores  Not difficult at all  Not difficult at all Not difficult at all     Review of Systems  Constitutional: Negative.   HENT: Negative.    Eyes: Negative.   Respiratory: Negative.    Cardiovascular: Negative.   Gastrointestinal: Negative.   Endocrine: Negative.   Genitourinary: Negative.   Musculoskeletal:  Positive for gait problem.       Left shoulder pain , right and left knee pain , left leg pain  Skin: Negative.   Allergic/Immunologic: Negative.   Hematological: Negative.   Psychiatric/Behavioral: Negative.         Objective:   Physical Exam PRIOR EXAM Gen: no distress, normal appearing, BMI 29.61, weight 224 lbs, BP 118/75 Gen: no distress, normal appearing HEENT: oral mucosa pink and moist, NCAT Cardio: Reg rate Chest: normal effort, normal rate of breathing Abd: soft, non-distended Ext: no edema Psych: pleasant, normal affect Skin: intact, no open lesions Neuro: Alert and oriented x3     Assessment & Plan:  Casey Hunter is a 68 year old man who presents for follow-up on LONG COVID symptoms.   1) HTN: Improved control. Discontinue Losartan  -continue flomax  -continue eating a banana every day -continue grapefruit at least once per week.  -Advised checking BP daily at home and logging results to bring into follow-up appointment with her PCP and myself. -Reviewed BP meds today.  -Advised checking BP daily at home and logging results to bring into follow-up appointment with PCP and myself. -Reviewed BP meds today.  -Advised regarding healthy foods that can help lower blood pressure and provided with a list: 1) citrus foods- high in vitamins and minerals 2) salmon and other fatty fish - reduces inflammation and oxylipins 3) swiss chard (leafy green)- high level of nitrates 4) pumpkin seeds- one of the best natural sources of magnesium 5) Beans and  lentils- high in fiber, magnesium, and potassium 6) Berries- high in flavonoids 7) Amaranth (whole grain, can be cooked similarly to rice and oats)- high in magnesium and fiber 8) Pistachios- even more effective at reducing BP than other nuts 9) Carrots- high in phenolic compounds that relax blood vessels and reduce inflammation 10) Celery- contain phthalides that relax tissues of arterial walls 11) Tomatoes- can also improve cholesterol and reduce risk of heart disease 12) Broccoli- good source of magnesium, calcium , and potassium 13) Greek yogurt: high in potassium and calcium  14) Herbs and spices: Celery seed, cilantro, saffron, lemongrass, black cumin, ginseng, cinnamon, cardamom, sweet basil, and ginger 15) Chia and flax seeds- also help to lower cholesterol and blood sugar 16) Beets- high levels of nitrates that relax blood vessels  17) spinach and bananas- high in potassium  -Provided lise of supplements that can help with hypertension:  1) magnesium: one high quality brand is Bioptemizers since it contains all 7 types of magnesium, otherwise over the counter magnesium gluconate 400mg  is a good option 2) B vitamins 3) vitamin D  4) potassium 5) CoQ10 6) L-arginine 7) Vitamin C 8) Beetroot -Educated that goal BP is 120/80. -Made goal to incorporate some of the above foods into diet.     2) Fatigue: Improved but still present. Continue NAC 600mg  BID and gave  list of foods that increase NAC. Off Ritalin . Increase work to 4 days per week until re-eval on April 1st. Provided with note. This is one of the reason Casey Hunter feels Casey Hunter needs to retire.   3) Vitamin D  deficiency -continue ergocalciferol  50,000U once per week for 7 weeks  4) Left leg numbness and foot drop: Persists, discussed that this has been getting worse. Explained etiologies of nerve injury 2/2 COVID-19 vs. Diabetic neuropathy.  -advised avoiding gluten, dairy eggs -advised eating a lot of fruits and vegetables -AFO ordered    5) History of Obesity, now overweight: Weight 224, lost 48 lbs in the past year- Casey Hunter eats salmon once per week, 1 egg daily, nuts, meat, olive oil. Made goal to avoid sugar.  -Discussed the benefits of intermittent fasting. Recommended starting with pushing dinner 15 minutes earlier and when this feels easy, continuing to push dinner 15 minutes earlier. Discussed that this can help her body to improve its ability to burn fat rather than glucose, improving insulin sensitivity. Recommended drinking Ro[place] tea in the evening to help curb appetite and for its numerous health benefits. -continue Ozempic  prescribed by his PCP   -Educated regarding health benefits of weight loss- for pain, general health, chronic disease prevention, immune health, mental health.  -Will monitor weight every visit.  -Consider Roobois tea daily.  -Discussed the benefits of intermittent fasting. -Discussed foods that can assist in weight loss: 1) leafy greens- high in fiber and nutrients 2) dark chocolate- improves metabolism (if prefer sweetened, best to sweeten with honey instead of sugar).  3) cruciferous vegetables- high in fiber and protein 4) full fat yogurt: high in healthy fat, protein, calcium , and probiotics 5) apples- high in a variety of phytochemicals 6) nuts- high in fiber and protein that increase feelings of fullness 7) grapefruit: rich in nutrients, antioxidants, and fiber (not to be taken with anticoagulation) 8) beans- high in protein and fiber 9) salmon- has high quality protein and healthy fats 10) green tea- rich in polyphenols 11) eggs- rich in choline and vitamin D  12) tuna- high protein, boosts metabolism 13) avocado- decreases visceral abdominal fat 14) chicken (pasture raised): high in protein and iron 15) blueberries- reduce abdominal fat and cholesterol 16) whole grains- decreases calories retained during digestion, speeds metabolism 17) chia seeds- curb appetite 18) chilies- increases  fat metabolism   6) DM 2:  Discussed HgbA1c worsening to 10.1, repeat HgbA1c today -discussed dietary changes -discussed that stress, lack of sleep, and recently worsening diet could have contributed -encouraging trying to find an aide to help with his mother's care so Casey Hunter can sleep at night -restarted metformin  500mg  daily.  -check CBGs daily, log, and bring log to follow-up appointment -avoid sugar, bread, pasta, rice -avoid snacking -Recommended 1 glass water with 1 TB apple cider vinegar before meals to reduce CBG spike, has additional health benefits, drink with straw to protect enamel.   -discussed that his PCP changed him from Trulicity  to Ozempic  -perform daily foot exam and at least annual eye exam -check CBGs daily, log, and bring log to follow-up appointment -avoid sugar, bread, pasta, rice -avoid snacking -perform daily foot exam and at least annual eye exam -try to incorporate into your diet some of the following foods which are good for diabetes: 1) cinnamon- imitates effects of insulin, increasing glucose transport into cells ([place] cinnamon is best, least processed) 2) nuts- can slow down the blood sugar response of carbohydrate rich foods 3) oatmeal- contains and anti-inflammatory  compound avenanthramide 4) whole-milk yogurt (best types are no sugar, [place] yogurt, or goat/sheep yogurt) 5) beans- high in protein, fiber, and vitamins, low glycemic index 6) broccoli- great source of vitamin A and C 7) quinoa- higher in protein and fiber than other grains 8) spinach- high in vitamin A, fiber, and protein 9) olive oil- reduces glucose levels, LDL, and triglycerides 10) salmon- excellent amount of omega-3-fatty acids 11) walnuts- rich in antioxidants 12) apples- high in fiber and quercetin 13) carrots- highly nutritious with low impact on blood sugar 14) eggs- improve HDL (good cholesterol), high in protein, keep you satiated 15) turmeric: improves blood  sugars, cardiovascular disease, and protects kidney health 16) garlic: improves blood sugar, blood pressure, pain 17) tomatoes: highly nutritious with low impact on blood sugar  7) HLD -really improved on last labs! -continue Rapatha -discussed results of 10/5 lipid panel which is within normal limits except for elevated triglycerides.   8) Diabetic peripheral neuropathy Refilled Lyrica  -discussed that Casey Hunter has been trying to cut out sweets, bread, and potatoes -discussed positive response to Qutenza  -Discussed Qutenza  as an option for neuropathic pain control. Discussed that this is a capsaicin  patch, stronger than capsaicin  cream. Discussed that it is currently approved for diabetic peripheral neuropathy and post-herpetic neuralgia, but that it has also shown benefit in treating other forms of neuropathy. Provided patient with link to site to learn more about the patch: https://www.qutenza .com/. Discussed that the patch would be placed in office and benefits usually last 3 months. Discussed that unintended exposure to capsaicin  can cause severe irritation of eyes, mucous membranes, respiratory tract, and skin, but that Qutenza  is a local treatment and does not have the systemic side effects of other nerve medications. Discussed that there may be pain, itching, erythema, and decreased sensory function associated with the application of Qutenza . Side effects usually subside within 1 week. A cold pack of analgesic medications can help with these side effects. Blood pressure can also be increased due to pain associated with administration of the patch.   Prescribed Zynex Nexwave  4 patches of Qutenza  was applied to the area of pain. Ice packs were applied during the procedure to ensure patient comfort. Blood pressure was monitored every 15 minutes. The patient tolerated the procedure well. Post-procedure instructions were given and follow-up has been scheduled.    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  -encouraged vitamin D  supplementation, resistance training, high protein/low carb diet.  -discussed that this has normalized.  -check testosterone  today -continue liver once in a while for nutritional benefits.   13) History of multiple acute COVID infections -prescribed pepcid  on Saturday and patient is now feeling better -discussed at least 5 day quarantine and until symptoms resolve -discussed his ivermectin use -discussed foods rich in vitamin C and zinc  to help boost immune system to reduce severe infections in the future -discussed risks and benefits of COVID vaccines  14) Cataracts -discussed plan for corrective lessons.  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  16) Long COVID - discussed that his symptoms have resolved  17) Grief -discussed his grief from his parents' death -discussed that his mother's dementia really impacted him  72) Impaired cognition -discussed that his  cognition is not the same as it was prior to COVID, but is much improved -continue freestyle libre -discussed that hs has drunk no soda or juice -recommend switching to D3/K2 1,000 once Casey Hunter finishes current D2 script -discussed that his mother had late-onset dementia  General health: Casey Hunter has received Shingles and pneumococcal vaccines, early flu shot, and 2nd COVID booster.   19) Foot drop/leg numbness: -discussed that this is getting worse -AFO ordered  15 minutes spent in discussion of his foot drop and numbness, discussed that Casey Hunter has no pain, discussed an AFO, discussed that I can order this for him if Casey Hunter would like, discussed avoiding eggs, gluten, dairy

## 2024-09-03 ENCOUNTER — Other Ambulatory Visit: Payer: Self-pay

## 2024-09-10 ENCOUNTER — Encounter: Attending: Physical Medicine and Rehabilitation | Admitting: Physical Medicine and Rehabilitation

## 2024-09-10 VITALS — BP 133/80 | HR 81 | Ht 74.0 in | Wt 227.0 lb

## 2024-09-10 DIAGNOSIS — Z794 Long term (current) use of insulin: Secondary | ICD-10-CM

## 2024-09-10 DIAGNOSIS — E1142 Type 2 diabetes mellitus with diabetic polyneuropathy: Secondary | ICD-10-CM | POA: Insufficient documentation

## 2024-09-10 MED ORDER — CAPSAICIN-CLEANSING GEL 8 % EX KIT
4.0000 | PACK | Freq: Once | CUTANEOUS | Status: AC
  Administered 2024-09-10: 4 via TOPICAL

## 2024-09-10 NOTE — Addendum Note (Signed)
 Addended by: JAMA FLEMING T on: 09/10/2024 04:06 PM   Modules accepted: Orders

## 2024-09-10 NOTE — Progress Notes (Signed)
 -  Discussed Qutenza as an option for neuropathic pain control. Discussed that this is a capsaicin patch, stronger than capsaicin cream. Discussed that it is currently approved for diabetic peripheral neuropathy and post-herpetic neuralgia, but that it has also shown benefit in treating other forms of neuropathy. Provided patient with link to site to learn more about the patch: https://www.clark.biz/. Discussed that the patch would be placed in office and benefits usually last 3 months. Discussed that unintended exposure to capsaicin can cause severe irritation of eyes, mucous membranes, respiratory tract, and skin, but that Qutenza is a local treatment and does not have the systemic side effects of other nerve medications. Discussed that there may be pain, itching, erythema, and decreased sensory function associated with the application of Qutenza. Side effects usually subside within 1 week. A cold pack of analgesic medications can help with these side effects. Blood pressure can also be increased due to pain associated with administration of the patch.   4 patches of Qutenza [phone]) was applied to bilateral feet. Ice packs were applied during the procedure to ensure patient comfort. Blood pressure was monitored every 15 minutes. The patient tolerated the procedure well. Post-procedure instructions were given and follow-up has been scheduled.  Topical system measures 14cm x20cm (280cm for a total 1120units) were applied which will cause deeper penetration for destruction of the peripheral nerve using a chemical (Qutenza) which infuses into the skin like an injection and heat technique (occlusive, compressive dressing cauing endothermic heat technique)

## 2024-09-18 ENCOUNTER — Ambulatory Visit: Admitting: Internal Medicine

## 2024-09-18 ENCOUNTER — Other Ambulatory Visit: Payer: Self-pay

## 2024-09-18 ENCOUNTER — Encounter: Payer: Self-pay | Admitting: Internal Medicine

## 2024-09-18 VITALS — BP 124/72 | HR 87 | Temp 98.0°F | Resp 16 | Wt 227.0 lb

## 2024-09-18 DIAGNOSIS — E1142 Type 2 diabetes mellitus with diabetic polyneuropathy: Secondary | ICD-10-CM

## 2024-09-18 DIAGNOSIS — Z7985 Long-term (current) use of injectable non-insulin antidiabetic drugs: Secondary | ICD-10-CM

## 2024-09-18 DIAGNOSIS — Z7984 Long term (current) use of oral hypoglycemic drugs: Secondary | ICD-10-CM

## 2024-09-18 DIAGNOSIS — I10 Essential (primary) hypertension: Secondary | ICD-10-CM

## 2024-09-18 LAB — POCT GLYCOSYLATED HEMOGLOBIN (HGB A1C): Hemoglobin A1C: 6.8 % — AB (ref 4.0–5.6)

## 2024-09-18 MED ORDER — OZEMPIC (2 MG/DOSE) 8 MG/3ML ~~LOC~~ SOPN
2.0000 mg | PEN_INJECTOR | SUBCUTANEOUS | 1 refills | Status: AC
  Filled 2024-09-18 – 2024-10-23 (×2): qty 6, 56d supply, fill #0

## 2024-09-18 MED ORDER — DAPAGLIFLOZIN PROPANEDIOL 10 MG PO TABS
10.0000 mg | ORAL_TABLET | Freq: Every day | ORAL | 1 refills | Status: AC
  Filled 2024-09-18 – 2024-12-16 (×2): qty 90, 90d supply, fill #0

## 2024-09-18 NOTE — Progress Notes (Signed)
 Established Patient Office Visit  Subjective   Patient ID: Casey Hunter., male    DOB: [birth year]  Age: 68 y.o. MRN: [id number]  Chief Complaint  Patient presents with   Medical Management of Chronic Issues    6 month follow up    HPI  Casey Hunter. presents to follow up on chronic medical conditions. Following with Dr. Lorilee PMR in [place] as well.  Discussed the use of AI scribe software for clinical note transcription with the patient, who gave verbal consent to proceed.  History of Present Illness Casey Hunter. is a 68 year old male with type 2 diabetes who presents for an A1c check.  His last A1c was 6.6, and he estimates it to be around 6.4 or 6.5 based on his calculations. He manages his diabetes by monitoring his diet, focusing on reducing bread and potatoes. He takes Ozempic , with a two-month supply, Farxiga , with a 90-day supply, and metformin  regularly.  He has hypertension and is currently taking nifedipine . He has reduced his Lyrica  dosage to every other day.  He underwent cataract surgery two years ago and recent procedures for eyelash growth and drooping eyelids, resulting in improved vision. He has regular eye exams with Dr. Con and an upcoming appointment with his eye surgeon.  He experiences neuropathy symptoms, including cold extremities and numbness in his left leg, leading to falls. He is exploring treatment options, including a consultation with a wellness center in [place].  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 He attributes his weight loss to dietary changes.   History of HTN: had been on 5 different medications but since he had COVID his blood pressure has been much better controlled.  -Currently only on Nifedipine  30 mg every other day and slowly weaning down.  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  -Patient is compliant with above medications and reports no side effects.  -Last lipid panel: Lipid Panel     Component Value Date/Time   CHOL 227 (H) 12/15/2023 1344   CHOL 264 (H) 12/02/2022 1149   CHOL 150 04/24/2018 0820   CHOL 166 11/22/2012 0903   TRIG 264 (H) 12/15/2023 1344   TRIG 174 (H) 04/24/2018 0820   TRIG 97 11/22/2012 0903   HDL 53 12/15/2023 1344   HDL 48 12/02/2022 1149   HDL 40 11/22/2012 0903   CHOLHDL 4.3 12/15/2023 1344   VLDL 29.2 04/24/2021 0945   VLDL 35 (H) 04/24/2018 0820   VLDL 19 11/22/2012 0903   LDLCALC 133 (H) 12/15/2023 1344   LDLCALC 107 (H) 11/22/2012 0903   LABVLDL 34 12/02/2022 1149   The 10-year ASCVD risk score (Arnett DK, et al., 2019) is: 37.5%   Values used to calculate the score:     Age: 4 years     Clincally relevant sex: Male     Is Non-Hispanic African American: No     Diabetic: Yes     Tobacco smoker: No     Systolic Blood Pressure: 133 mmHg     Is BP treated: Yes     HDL Cholesterol: 53 mg/dL     Total Cholesterol: 227 mg/dL  Diabetes, Type 2 with Neuropathy: -Last A1c 4/25  6.6% -Medications: Ozempic  2 mg, Farxiga  10 mg, Metformin  1000 mg twice daily. Also on Lyrica  75 mg 4 times a day, Nifedipine  30 mg for numbness and tingling. PMR does Capsaicin  patches for him as well.  -Patient is compliant with the above medications and reports no side effects.  -Eye exam: Has an eye doctor - was seen in 2024 but has an appointment Nov. 11th -Foot exam: UTD -Microalbumin: UTD -Statin: Statin intolerance -PNA vaccine: UTD  -Denies symptoms of hypoglycemia, polyuria, polydipsia, numbness extremities, foot ulcers/trauma.   BPH: -Currently on Flomax  0.8 mg, symptoms fairly well controlled   Vitamin D  Deficiency: -Currently on 50,000 IU weekly - has been on this dose about 1 year -Last  Vitamin D  level 81 1/25  Health Maintenance: -Blood work UTD -Colonoscopy 2011, due - had one in December but was not completely cleaned out, patient will call and reschedule in the next few months   Patient Active Problem List   Diagnosis Date Noted   Type 2 diabetes mellitus with diabetic polyneuropathy, without long-term current use of insulin (HCC) 12/15/2023   Encounter for screening colonoscopy 10/31/2023   B12 deficiency 04/24/2021   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   Diabetic neuropathy (HCC) 04/25/2020   Fatigue 04/25/2020   Pain in joint, multiple sites 04/25/2020   Physical deconditioning 04/25/2020   DOE (dyspnea on exertion) 01/30/2020   Diabetes mellitus type 2 with complications (HCC) 03/14/2017   Knee osteoarthritis 04/14/2016   Psoriasis 04/14/2016   Sleep apnea 09/16/2015   Essential hypertension 09/16/2015   Hyperlipidemia associated with type 2 diabetes mellitus 09/16/2015   Past Medical History:  Diagnosis Date   Diabetic peripheral neuropathy associated with type 2 diabetes mellitus 03/14/2017   DOE (dyspnea on exertion) 11/2019   Onset jan 2021 with covid 19  - assoc with atypical cp developed during the infection present mostly sitting/ absent supine/ better with deep breathing/ no worse with ex - 01/30/2020   Walked RA x two laps =  approx 583ft @ brisk pace - stopped due to end of study, no sob/ no change cp  with sats of 93 % at the end of the study and no acute ekg changes  - Echo  02/13/2020  wnl with  no pericadial ef   Fatigue 11/2019   History of COVID-19 11/2019   Hyperlipidemia associated with type 2 diabetes mellitus 09/16/2015   Hypertension associated with type 2 diabetes mellitus 09/16/2015   Knee osteoarthritis 04/14/2016   Mild neurocognitive disorder 06/12/2020   Numbness of lower extremity 11/2019   Attributed to COVID-19   Sleep apnea    Patient denied being diagnosed with OSA in the past   Past Surgical History:   Procedure Laterality Date   COLONOSCOPY WITH PROPOFOL  N/A 10/31/2023   Procedure: COLONOSCOPY WITH PROPOFOL ;  Surgeon: Therisa Bi, MD;  Location: [facility];  Service: Gastroenterology;  Laterality: N/A;   HERNIA REPAIR     Social History   Tobacco Use   Smoking status: Never   Smokeless tobacco: Never  Vaping Use   Vaping status: Never Used  Substance Use Topics   Alcohol use: No    Alcohol/week: 0.0 standard drinks of alcohol   Drug use: No   Social History   Socioeconomic History   Marital status: Divorced    Spouse name: Not on file   Number of children: 2   Years of education: 12   Highest education level: High school graduate  Occupational History   Not on  file  Tobacco Use   Smoking status: Never   Smokeless tobacco: Never  Vaping Use   Vaping status: Never Used  Substance and Sexual Activity   Alcohol use: No    Alcohol/week: 0.0 standard drinks of alcohol   Drug use: No   Sexual activity: Not Currently  Other Topics Concern   Not on file  Social History Narrative   Lives alone   Social Drivers of Health   Financial Resource Strain: Low Risk  (05/10/2024)   Overall Financial Resource Strain (CARDIA)    Difficulty of Paying Living Expenses: Not hard at all  Food Insecurity: No Food Insecurity (05/10/2024)   Hunger Vital Sign    Worried About Running Out of Food in the Last Year: Never true    Ran Out of Food in the Last Year: Never true  Transportation Needs: No Transportation Needs (05/10/2024)   PRAPARE - Administrator, Civil Service (Medical): No    Lack of Transportation (Non-Medical): No  Physical Activity: Sufficiently Active (05/10/2024)   Exercise Vital Sign    Days of Exercise per Week: 5 days    Minutes of Exercise per Session: 60 min  Stress: No Stress Concern Present (05/10/2024)   [employer] of Occupational Health - Occupational Stress Questionnaire    Feeling of Stress: Not at all  Social Connections: Moderately  Isolated (05/10/2024)   Social Connection and Isolation Panel    Frequency of Communication with Friends and Family: More than three times a week    Frequency of Social Gatherings with Friends and Family: More than three times a week    Attends Religious Services: Never    Database Administrator or Organizations: Yes    Attends Engineer, Structural: More than 4 times per year    Marital Status: Divorced  Intimate Partner Violence: Not At Risk (06/20/2024)   Received [facility]   Humiliation, Afraid, Rape, and Kick questionnaire    Within the last year, have you been afraid of your partner or ex-partner?: No    Within the last year, have you been humiliated or emotionally abused in other ways by your partner or ex-partner?: No    Within the last year, have you been kicked, hit, slapped, or otherwise physically hurt by your partner or ex-partner?: No    Within the last year, have you been raped or forced to have any kind of sexual activity by your partner or ex-partner?: No   Family Status  Relation Name Status   Mother  Alive   Father  Alive   Sister  Alive   Brother  Alive   Son  Alive   MGM  Deceased   MGF  Deceased   PGM  Deceased   PGF  Deceased   Sister  Alive   Son  Alive  No partnership data on file   Family History  Problem Relation Age of Onset   Hypertension Mother    Hyperlipidemia Mother    Hyperlipidemia Father    Allergies  Allergen Reactions   Crestor [Rosuvastatin Calcium ]     Myalgias   Elemental Sulfur    Lipitor [Atorvastatin]     Myalgia   Penicillin G Potassium [Penicillin G]       Review of Systems  All other systems reviewed and are negative.     Objective:     BP 124/72   Pulse 87   Temp 98 F (36.7 C) (Oral)   Resp  16   Wt 227 lb (103 kg)   SpO2 97%   BMI 29.15 kg/m  BP Readings from Last 3 Encounters:  09/10/24 133/80  06/08/24 121/74  03/14/24 134/88   Wt Readings from Last 3 Encounters:  09/10/24 227 lb  (103 kg)  06/08/24 232 lb 6.4 oz (105.4 kg)  03/14/24 232 lb 9.6 oz (105.5 kg)      Physical Exam Constitutional:      Appearance: Normal appearance.  HENT:     Head: Normocephalic and atraumatic.  Eyes:     Conjunctiva/sclera: Conjunctivae normal.  Cardiovascular:     Rate and Rhythm: Normal rate and regular rhythm.  Pulmonary:     Effort: Pulmonary effort is normal.     Breath sounds: Normal breath sounds.  Skin:    General: Skin is warm and dry.  Neurological:     General: No focal deficit present.     Mental Status: He is alert. Mental status is at baseline.  Psychiatric:        Mood and Affect: Mood normal.        Behavior: Behavior normal.      No results found for any visits on 09/18/24.  Last CBC Lab Results  Component Value Date   WBC 9.0 12/15/2023   HGB 15.8 12/15/2023   HCT 48.1 12/15/2023   MCV 85.6 12/15/2023   MCH 28.1 12/15/2023   RDW 12.8 12/15/2023   PLT 287 12/15/2023   Last metabolic panel Lab Results  Component Value Date   GLUCOSE 167 (H) 12/15/2023   NA 137 12/15/2023   K 3.8 12/15/2023   CL 101 12/15/2023   CO2 25 12/15/2023   BUN 15 12/15/2023   CREATININE 0.92 12/15/2023   EGFR 91 12/15/2023   CALCIUM  9.4 12/15/2023   PROT 8.1 12/15/2023   ALBUMIN 3.9 04/24/2021   LABGLOB 3.3 11/17/2020   AGRATIO 1.3 11/17/2020   BILITOT 0.5 12/15/2023   ALKPHOS 68 04/24/2021   AST 12 12/15/2023   ALT 12 12/15/2023   ANIONGAP 9 11/22/2012   Last lipids Lab Results  Component Value Date   CHOL 227 (H) 12/15/2023   HDL 53 12/15/2023   LDLCALC 133 (H) 12/15/2023   TRIG 264 (H) 12/15/2023   CHOLHDL 4.3 12/15/2023   Last hemoglobin A1c Lab Results  Component Value Date   HGBA1C 6.6 (A) 03/14/2024   Last thyroid  functions Lab Results  Component Value Date   TSH 1.30 04/24/2021   T4TOTAL 7.1 04/24/2020   Last vitamin D  Lab Results  Component Value Date   VD25OH 82 12/15/2023   Last vitamin B12 and Folate Lab Results   Component Value Date   VITAMINB12 275 04/24/2021   FOLATE 11.2 06/27/2020      The 10-year ASCVD risk score (Arnett DK, et al., 2019) is: 37.5%    Assessment & Plan:   Assessment & Plan Type 2 diabetes mellitus with diabetic polyneuropathy Managed with medications. A1c at 6.8, slightly elevated but acceptable. Neuropathy symptoms persist post-COVID-19. Exploring alternative treatments. - Refill Ozempic  and Farxiga . - Encourage carbohydrate reduction for A1c improvement and weight loss. - Consult [facility] for neuropathy treatment.  Hypertension Well-controlled with current regimen. Blood pressure at 124/72 mmHg. - Continue current antihypertensive medications. - Monitor blood pressure regularly.  General Health Maintenance Up to date with vaccinations and screenings. Regular flu vaccinations and current eye exams. - Administer flu vaccine at end of November. - Continue regular eye exams with Dr. Con.  -  POCT HgB A1C - dapagliflozin  propanediol (FARXIGA ) 10 MG TABS tablet; Take 1 tablet (10 mg total) by mouth daily.  Dispense: 90 tablet; Refill: 1 - Semaglutide , 2 MG/DOSE, (OZEMPIC , 2 MG/DOSE,) 8 MG/3ML SOPN; Inject 2 mg into the skin once a week.  Dispense: 6 mL; Refill: 1   Return in about 6 months (around 03/19/2025).    Sharyle Fischer, DO

## 2024-10-02 DIAGNOSIS — E113393 Type 2 diabetes mellitus with moderate nonproliferative diabetic retinopathy without macular edema, bilateral: Secondary | ICD-10-CM | POA: Diagnosis not present

## 2024-10-23 ENCOUNTER — Other Ambulatory Visit: Payer: Self-pay

## 2024-10-23 MED FILL — Continuous Glucose System Sensor: 84 days supply | Qty: 6 | Fill #2 | Status: AC

## 2024-10-24 ENCOUNTER — Other Ambulatory Visit: Payer: Self-pay

## 2024-10-24 MED ORDER — FLUZONE HIGH-DOSE 0.5 ML IM SUSY
0.5000 mL | PREFILLED_SYRINGE | Freq: Once | INTRAMUSCULAR | 0 refills | Status: AC
  Filled 2024-10-24: qty 0.5, 1d supply, fill #0

## 2024-11-12 ENCOUNTER — Other Ambulatory Visit: Payer: Self-pay

## 2024-11-12 ENCOUNTER — Other Ambulatory Visit: Payer: Self-pay | Admitting: Physical Medicine and Rehabilitation

## 2024-11-12 MED ORDER — JOURNAVX 50 MG PO TABS
1.0000 | ORAL_TABLET | Freq: Every day | ORAL | 3 refills | Status: AC | PRN
  Filled 2024-11-12: qty 30, 30d supply, fill #0

## 2024-11-13 ENCOUNTER — Other Ambulatory Visit: Payer: Self-pay

## 2024-12-05 ENCOUNTER — Telehealth: Payer: Self-pay

## 2024-12-05 DIAGNOSIS — E1142 Type 2 diabetes mellitus with diabetic polyneuropathy: Secondary | ICD-10-CM

## 2024-12-05 NOTE — Telephone Encounter (Signed)
 Patient called stating that his insurance states that his copay would be cheaper if he got the Qutenza  patches from [facility] or [facility]. Please advice.

## 2024-12-07 MED ORDER — QUTENZA (4 PATCH) 8 % EX KIT: 4.0000 | PACK | Freq: Once | CUTANEOUS | 3 refills | Status: AC

## 2024-12-11 ENCOUNTER — Encounter: Admitting: Physical Medicine and Rehabilitation

## 2024-12-12 NOTE — Telephone Encounter (Signed)
 sent

## 2024-12-16 ENCOUNTER — Other Ambulatory Visit: Payer: Self-pay

## 2024-12-25 ENCOUNTER — Other Ambulatory Visit: Payer: Self-pay

## 2024-12-25 MED ORDER — FLUOCINOLONE ACETONIDE 0.01 % OT OIL
1.0000 [drp] | TOPICAL_OIL | Freq: Every day | OTIC | 5 refills | Status: AC
  Filled 2024-12-25: qty 20, 30d supply, fill #0

## 2024-12-28 ENCOUNTER — Other Ambulatory Visit: Payer: Self-pay

## 2025-01-08 ENCOUNTER — Encounter: Admitting: Physical Medicine and Rehabilitation

## 2025-03-05 ENCOUNTER — Encounter: Admitting: Physical Medicine and Rehabilitation

## 2025-03-20 ENCOUNTER — Ambulatory Visit: Admitting: Internal Medicine

## 2025-05-16 ENCOUNTER — Ambulatory Visit
